# Patient Record
Sex: Male | Born: 1960
Health system: Southern US, Community
[De-identification: ages and names within clinical notes are randomized; demographics above are authoritative.]

## PROBLEM LIST (undated history)

## (undated) DIAGNOSIS — Z87442 Personal history of urinary calculi: Secondary | ICD-10-CM

## (undated) DIAGNOSIS — K819 Cholecystitis, unspecified: Secondary | ICD-10-CM

## (undated) DIAGNOSIS — M1990 Unspecified osteoarthritis, unspecified site: Secondary | ICD-10-CM

## (undated) DIAGNOSIS — I10 Essential (primary) hypertension: Secondary | ICD-10-CM

## (undated) HISTORY — DX: Essential (primary) hypertension: I10

## (undated) HISTORY — PX: SHOULDER SURGERY: SHX246

## (undated) HISTORY — PX: APPENDECTOMY: SHX54

---

## 2005-03-30 ENCOUNTER — Ambulatory Visit: Payer: Self-pay | Admitting: Cardiology

## 2005-03-31 ENCOUNTER — Ambulatory Visit: Payer: Self-pay | Admitting: Cardiology

## 2005-05-02 ENCOUNTER — Ambulatory Visit: Payer: Self-pay | Admitting: Cardiology

## 2014-07-23 ENCOUNTER — Emergency Department (HOSPITAL_COMMUNITY): Payer: BC Managed Care – PPO

## 2014-07-23 ENCOUNTER — Inpatient Hospital Stay (HOSPITAL_COMMUNITY)
Admission: EM | Admit: 2014-07-23 | Discharge: 2014-07-26 | DRG: 580 | Disposition: A | Discharge status: Home / Self Care | Payer: BC Managed Care – PPO | Attending: Internal Medicine | Admitting: Internal Medicine

## 2014-07-23 ENCOUNTER — Encounter (HOSPITAL_COMMUNITY): Payer: Self-pay | Admitting: Emergency Medicine

## 2014-07-23 DIAGNOSIS — E872 Acidosis: Secondary | ICD-10-CM | POA: Diagnosis present

## 2014-07-23 DIAGNOSIS — N5089 Other specified disorders of the male genital organs: Secondary | ICD-10-CM | POA: Diagnosis present

## 2014-07-23 DIAGNOSIS — L0390 Cellulitis, unspecified: Secondary | ICD-10-CM

## 2014-07-23 DIAGNOSIS — L03314 Cellulitis of groin: Principal | ICD-10-CM | POA: Diagnosis present

## 2014-07-23 DIAGNOSIS — Z7982 Long term (current) use of aspirin: Secondary | ICD-10-CM | POA: Diagnosis not present

## 2014-07-23 DIAGNOSIS — Z6838 Body mass index (BMI) 38.0-38.9, adult: Secondary | ICD-10-CM | POA: Diagnosis not present

## 2014-07-23 DIAGNOSIS — N492 Inflammatory disorders of scrotum: Secondary | ICD-10-CM | POA: Diagnosis present

## 2014-07-23 DIAGNOSIS — E8729 Other acidosis: Secondary | ICD-10-CM | POA: Diagnosis present

## 2014-07-23 DIAGNOSIS — N50819 Testicular pain, unspecified: Secondary | ICD-10-CM

## 2014-07-23 DIAGNOSIS — N508 Other specified disorders of male genital organs: Secondary | ICD-10-CM | POA: Diagnosis present

## 2014-07-23 DIAGNOSIS — A4901 Methicillin susceptible Staphylococcus aureus infection, unspecified site: Secondary | ICD-10-CM

## 2014-07-23 DIAGNOSIS — E669 Obesity, unspecified: Secondary | ICD-10-CM | POA: Diagnosis present

## 2014-07-23 DIAGNOSIS — I10 Essential (primary) hypertension: Secondary | ICD-10-CM

## 2014-07-23 DIAGNOSIS — R030 Elevated blood-pressure reading, without diagnosis of hypertension: Secondary | ICD-10-CM

## 2014-07-23 DIAGNOSIS — IMO0001 Reserved for inherently not codable concepts without codable children: Secondary | ICD-10-CM | POA: Diagnosis present

## 2014-07-23 LAB — BASIC METABOLIC PANEL
Anion gap: 15 (ref 5–15)
BUN: 19 mg/dL (ref 6–23)
CO2: 23 mEq/L (ref 19–32)
Calcium: 9.4 mg/dL (ref 8.4–10.5)
Chloride: 100 mEq/L (ref 96–112)
Creatinine, Ser: 1 mg/dL (ref 0.50–1.35)
GFR calc Af Amer: >90 mL/min (ref >90)
GFR calc non Af Amer: 84 mL/min — ABNORMAL LOW (ref >90)
Glucose, Bld: 103 mg/dL — ABNORMAL HIGH (ref 70–99)
Potassium: 3.9 mEq/L (ref 3.7–5.3)
Sodium: 138 mEq/L (ref 137–147)

## 2014-07-23 LAB — CBC WITH DIFFERENTIAL/PLATELET
Basophils Absolute: 0 10*3/uL (ref 0.0–0.1)
Basophils Relative: 0 % (ref 0–1)
Eosinophils Absolute: 0 10*3/uL (ref 0.0–0.7)
Eosinophils Relative: 0 % (ref 0–5)
HCT: 36.6 % — ABNORMAL LOW (ref 39.0–52.0)
Hemoglobin: 12.3 g/dL — ABNORMAL LOW (ref 13.0–17.0)
Lymphocytes Relative: 16 % (ref 12–46)
Lymphs Abs: 2.4 10*3/uL (ref 0.7–4.0)
MCH: 31.1 pg (ref 26.0–34.0)
MCHC: 33.6 g/dL (ref 30.0–36.0)
MCV: 92.7 fL (ref 78.0–100.0)
Monocytes Absolute: 0.6 10*3/uL (ref 0.1–1.0)
Monocytes Relative: 4 % (ref 3–12)
Neutro Abs: 11.8 10*3/uL — ABNORMAL HIGH (ref 1.7–7.7)
Neutrophils Relative %: 80 % — ABNORMAL HIGH (ref 43–77)
Platelets: 309 10*3/uL (ref 150–400)
RBC: 3.95 MIL/uL — ABNORMAL LOW (ref 4.22–5.81)
RDW: 12.5 % (ref 11.5–15.5)
WBC: 14.9 10*3/uL — ABNORMAL HIGH (ref 4.0–10.5)

## 2014-07-23 LAB — URINALYSIS, ROUTINE W REFLEX MICROSCOPIC
Bilirubin Urine: NEGATIVE
Glucose, UA: NEGATIVE mg/dL
Hgb urine dipstick: NEGATIVE
Ketones, ur: 15 mg/dL — AB
Leukocytes, UA: NEGATIVE
Nitrite: NEGATIVE
Protein, ur: NEGATIVE mg/dL
Specific Gravity, Urine: 1.03 (ref 1.005–1.030)
Urobilinogen, UA: 0.2 mg/dL (ref 0.0–1.0)
pH: 6 (ref 5.0–8.0)

## 2014-07-23 LAB — LACTIC ACID, PLASMA: Lactic Acid, Venous: 0.6 mmol/L (ref 0.5–2.2)

## 2014-07-23 MED ORDER — DOCUSATE SODIUM 100 MG PO CAPS
100.0000 mg | ORAL_CAPSULE | Freq: Two times a day (BID) | ORAL | Status: DC
  Administered 2014-07-23 – 2014-07-26 (×3): 100 mg via ORAL
  Filled 2014-07-23 (×8): qty 1

## 2014-07-23 MED ORDER — ONDANSETRON HCL 4 MG/2ML IJ SOLN: 4.0000 mg | Freq: Four times a day (QID) | INTRAMUSCULAR | Status: DC | PRN

## 2014-07-23 MED ORDER — PIPERACILLIN-TAZOBACTAM 3.375 G IVPB
3.3750 g | Freq: Three times a day (TID) | INTRAVENOUS | Status: DC
  Administered 2014-07-23 – 2014-07-24 (×3): 3.375 g via INTRAVENOUS
  Filled 2014-07-23 (×4): qty 50

## 2014-07-23 MED ORDER — SODIUM CHLORIDE 0.9 % IV BOLUS (SEPSIS)
1000.0000 mL | Freq: Once | INTRAVENOUS | Status: AC
  Administered 2014-07-23: 1000 mL via INTRAVENOUS

## 2014-07-23 MED ORDER — ONDANSETRON HCL 4 MG PO TABS: 4.0000 mg | ORAL_TABLET | Freq: Four times a day (QID) | ORAL | Status: DC | PRN

## 2014-07-23 MED ORDER — SODIUM CHLORIDE 0.9 % IV SOLN
Freq: Once | INTRAVENOUS | Status: AC
  Administered 2014-07-23: 17:00:00 via INTRAVENOUS

## 2014-07-23 MED ORDER — MORPHINE SULFATE 4 MG/ML IJ SOLN
4.0000 mg | Freq: Once | INTRAMUSCULAR | Status: AC
  Administered 2014-07-23: 4 mg via INTRAVENOUS
  Filled 2014-07-23: qty 1

## 2014-07-23 MED ORDER — HEPARIN SODIUM (PORCINE) 5000 UNIT/ML IJ SOLN
5000.0000 [IU] | Freq: Three times a day (TID) | INTRAMUSCULAR | Status: DC
  Administered 2014-07-23 – 2014-07-25 (×4): 5000 [IU] via SUBCUTANEOUS
  Filled 2014-07-23 (×10): qty 1

## 2014-07-23 MED ORDER — VANCOMYCIN HCL 10 G IV SOLR
2500.0000 mg | Freq: Once | INTRAVENOUS | Status: AC
  Administered 2014-07-23: 2500 mg via INTRAVENOUS
  Filled 2014-07-23: qty 2500

## 2014-07-23 MED ORDER — ONDANSETRON HCL 4 MG/2ML IJ SOLN
4.0000 mg | Freq: Once | INTRAMUSCULAR | Status: AC
  Administered 2014-07-23: 4 mg via INTRAVENOUS
  Filled 2014-07-23: qty 2

## 2014-07-23 MED ORDER — IOHEXOL 300 MG/ML  SOLN
100.0000 mL | Freq: Once | INTRAMUSCULAR | Status: AC | PRN
  Administered 2014-07-23: 100 mL via INTRAVENOUS

## 2014-07-23 MED ORDER — MORPHINE SULFATE 2 MG/ML IJ SOLN
1.0000 mg | INTRAMUSCULAR | Status: DC | PRN
  Administered 2014-07-24: 2 mg via INTRAVENOUS
  Filled 2014-07-23: qty 1

## 2014-07-23 MED ORDER — VANCOMYCIN HCL 10 G IV SOLR
1250.0000 mg | Freq: Two times a day (BID) | INTRAVENOUS | Status: DC
  Administered 2014-07-24 – 2014-07-25 (×3): 1250 mg via INTRAVENOUS
  Filled 2014-07-23 (×4): qty 1250

## 2014-07-23 NOTE — ED Provider Notes (Signed)
CSN: [id number]     Arrival date & time 07/23/14  1336 History   First MD Initiated Contact with Patient 07/23/14 1516     Chief Complaint  Patient presents with  . Testicle Pain     (Consider location/radiation/quality/duration/timing/severity/associated sxs/prior Treatment) HPI  Bobby Ortiz is a 53 y.o. male who was otherwise healthy complaining of painful swelling to right testicle. Patient states that he shaved the area week ago and several days ago noticed what he thought was an ingrown hair, since that time the area has opened and is draining purulent discharge and the redness and swelling are spreading rapidly. He denies fever, nausea, vomiting, change in bowel or bladder habits, dysuria, hematuria, concern about STDs, history of diabetes.   History reviewed. No pertinent past medical history. Past Surgical History  Procedure Laterality Date  . Appendectomy     History reviewed. No pertinent family history. History  Substance Use Topics  . Smoking status: Never Smoker   . Smokeless tobacco: Not on file  . Alcohol Use: No    Review of Systems  10 systems reviewed and found to be negative, except as noted in the HPI.   Allergies  Review of patient's allergies indicates no known allergies.  Home Medications   Prior to Admission medications   Medication Sig Start Date End Date Taking? Authorizing Provider  aspirin EC 81 MG tablet Take 81 mg by mouth daily.   Yes Historical Provider, MD  ibuprofen (ADVIL,MOTRIN) 200 MG tablet Take 400-600 mg by mouth every 6 (six) hours as needed.   Yes Historical Provider, MD   BP 147/88  Pulse 86  Temp(Src) 98.6 F (37 C) (Oral)  Resp 18  Ht 6\' 3"  (1.905 m)  Wt 305 lb (138.347 kg)  BMI 38.12 kg/m2  SpO2 96% Physical Exam  Nursing note and vitals reviewed. Constitutional: He is oriented to person, place, and time. He appears well-developed and well-nourished. No distress.  HENT:  Head: Normocephalic.  Mouth/Throat:  Oropharynx is clear and moist.  Eyes: Conjunctivae and EOM are normal.  Neck: Normal range of motion.  Cardiovascular: Normal rate and regular rhythm.   Pulmonary/Chest: Effort normal and breath sounds normal. No stridor.  Abdominal: Soft. Bowel sounds are normal. He exhibits no distension and no mass. There is no tenderness. There is no rebound.  Genitourinary: Right testis shows swelling and tenderness. Penile tenderness present.  Significant cellulitis in right inguinal and testicular area with edema extending into the penis. There is a actively draining abscess at the distal inguinal area  Musculoskeletal: Normal range of motion.  Lymphadenopathy:       Right: Inguinal adenopathy present.  Neurological: He is alert and oriented to person, place, and time.  Psychiatric: He has a normal mood and affect.    ED Course  Procedures (including critical care time) Labs Review Labs Reviewed  CBC WITH DIFFERENTIAL - Abnormal; Notable for the following:    WBC 14.9 (*)    RBC 3.95 (*)    Hemoglobin 12.3 (*)    HCT 36.6 (*)    Neutrophils Relative % 80 (*)    Neutro Abs 11.8 (*)    All other components within normal limits  BASIC METABOLIC PANEL - Abnormal; Notable for the following:    Glucose, Bld 103 (*)    GFR calc non Af Amer 84 (*)    All other components within normal limits  URINALYSIS, ROUTINE W REFLEX MICROSCOPIC - Abnormal; Notable for the following:    Ketones,  ur 15 (*)    All other components within normal limits  CULTURE, BLOOD (ROUTINE X 2)  CULTURE, BLOOD (ROUTINE X 2)  WOUND CULTURE    Imaging Review Ct Pelvis W Contrast  07/23/2014   CLINICAL DATA:  Right-sided testicular swelling.Testicular swelling, right N50.8 (ICD-10-CM). Initial encounter.  EXAM: CT PELVIS WITH CONTRAST  TECHNIQUE: Multidetector CT imaging of the pelvis was performed using the standard protocol following the bolus administration of intravenous contrast.  CONTRAST:  OMNIPAQUE IOHEXOL 300  MG/ML  SOLN  COMPARISON:  None.  FINDINGS: Normal imaged large and small bowel loops. Prior appendectomy per report.  Right external iliac node measures 1.7 cm on image 64 of series 3. Right inguinal node measures 1.8 cm on image 74.  Normal urinary bladder and prostate, without free pelvic fluid.  Bilateral fat containing inguinal hernias, small.  A fat containing ventral pelvic wall hernia. Nonspecific atrophy of the lower right rectus musculature.  Extensive skin thickening and subcutaneous edema is identified at the base of the scrotum, eccentric right. Example image 81 of series 3. There is diffuse scrotal edema and subcutaneous fluid. Testicles not well evaluated. No drainable fluid collection or evidence of soft tissue gas.  Degenerative partial fusion of the bilateral sacroiliac joints. Advanced degenerative disc disease involving the lumbosacral junction  IMPRESSION: 1. Findings most consistent with cellulitis involving the scrotum, especially eccentric right. No evidence of abscess or soft tissue gas. Consider ultrasound evaluation of the scrotal contents. 2. Right pelvic adenopathy, favored to be reactive. Given the extent of adenopathy, pelvic CT followup at 3 to six-months should be considered to confirm resolution. 3. Fat containing bilateral inguinal and ventral pelvic wall hernias.   Electronically Signed   By: Jeronimo Greaves M.D.   On: 07/23/2014 18:25     EKG Interpretation None      MDM   Final diagnoses:  Testicle pain  Cellulitis  Testicular swelling, right   Filed Vitals:   07/23/14 1645 07/23/14 1700 07/23/14 1715 07/23/14 1730  BP: 152/90 160/81 155/94 147/88  Pulse: 81 83 83 86  Temp:      TempSrc:      Resp:      Height:      Weight:      SpO2: 96% 96% 97% 96%    Medications  vancomycin (VANCOCIN) 2,500 mg in sodium chloride 0.9 % 500 mL IVPB (2,500 mg Intravenous New Bag/Given 07/23/14 1719)  vancomycin (VANCOCIN) 1,250 mg in sodium chloride 0.9 % 250 mL IVPB  (not administered)  piperacillin-tazobactam (ZOSYN) IVPB 3.375 g (0 g Intravenous Stopped 07/23/14 1719)  morphine 4 MG/ML injection 4 mg (4 mg Intravenous Given 07/23/14 1617)  ondansetron (ZOFRAN) injection 4 mg (4 mg Intravenous Given 07/23/14 1618)  sodium chloride 0.9 % bolus 1,000 mL (0 mLs Intravenous Stopped 07/23/14 1719)  0.9 %  sodium chloride infusion ( Intravenous New Bag/Given 07/23/14 1720)  iohexol (OMNIPAQUE) 300 MG/ML solution 100 mL (100 mLs Intravenous Contrast Given 07/23/14 1755)    Bobby Ortiz is a 53 y.o. male presenting with a significant cellulitis to the right inguinal area extending into the skin of the testes and penile shaft. No systemic signs of infection with normal vital signs, he is afebrile. White count is elevated at 14.9. Patient will be given pain medication, blood cultures, wound cultures are drawn before starting vancomycin and Zosyn. Will obtain CT to evaluate for drainable collection.  CT shows diffuse edema with no focal fluid collection. Patient will be admitted  for IV antibiotics.   Unassigned admission accepted  internal medicine teaching service.   Urology [facility] and [facility] evaluated the patient and will both continue to consult. Plan is to give the patient IV antibiotics and reevaluate in the morning. If there is not significant improvement they plan to take him to the OR, therefore patient needs to remain nothing by mouth after midnight. Discussed plan in internal medicine resident Dr. Derrill KayGoodman  This is a shared visit with the attending physician who personally evaluated the patient and agrees with the care plan.     Bobby Emeryicole Leonidus Rowand, PA-C 07/23/14 2025  Bobby EmeryNicole Richar Dunklee, PA-C 07/23/14 2110

## 2014-07-23 NOTE — Progress Notes (Signed)
I agree with the above assessment. Consider de-escalating Zosyn to Unasyn since patient does not seem to have any MDR risk factors.   Vinnie LevelBenjamin Ivyana Locey, PharmD.  Clinical Pharmacist Pager [phone]

## 2014-07-23 NOTE — Consult Note (Signed)
Urology Consult  Referring physician: Chandra Batch Reason for referral: Groin infection  Chief Complaint: Groin/scrotal infection  History of Present Illness: Swelling rt testes possibly from shaving one week prior; draining; no systemic symptoms; admitted to Medicine on Vanc and Zosyn: WBC 14.9: Ct scan - no obvious abscess; keeping with cellulitis; started just above the penis in groin; draining now for 1-2 days; no fever Modifying factors: There are no other modifying factors  Associated signs and symptoms: There are no other associated signs and symptoms Aggravating and relieving factors: There are no other aggravating or relieving factors Severity: Moderate Duration: Persistent  History reviewed. No pertinent past medical history. Past Surgical History  Procedure Laterality Date  . Appendectomy      Medications: I have reviewed the patient's current medications. Allergies: No Known Allergies  History reviewed. No pertinent family history. Social History:  reports that he has never smoked. He does not have any smokeless tobacco history on file. He reports that he does not drink alcohol. His drug history is not on file.  ROS: All systems are reviewed and negative except as noted. Rest negative  Physical Exam:  Vital signs in last 24 hours: Temp:  [98.6 F (37 C)] 98.6 F (37 C) (10/28 1343) Pulse Rate:  [80-93] 86 (10/28 1730) Resp:  [18] 18 (10/28 1343) BP: (143-184)/(81-116) 147/88 mmHg (10/28 1730) SpO2:  [96 %-97 %] 96 % (10/28 1730) Weight:  [138.347 kg (305 lb)] 138.347 kg (305 lb) (10/28 1600)  Cardiovascular: Skin warm; not flushed Respiratory: Breaths quiet; no shortness of breath Abdomen: No masses Neurological: Normal sensation to touch Musculoskeletal: Normal motor function arms and legs Lymphatics: No inguinal adenopathy Skin: No rashes Genitourinary:cellulitis rt lower abdomen/mild groin and rt hemiscrotum; draining area above penis; scrotum doughy easily to  palpate testes  Laboratory Data:  Results for orders placed during the hospital encounter of 07/23/14 (from the past 72 hour(s))  URINALYSIS, ROUTINE W REFLEX MICROSCOPIC     Status: Abnormal   Collection Time    07/23/14  3:20 PM      Result Value Ref Range   Color, Urine YELLOW  YELLOW   APPearance CLEAR  CLEAR   Specific Gravity, Urine 1.030  1.005 - 1.030   pH 6.0  5.0 - 8.0   Glucose, UA NEGATIVE  NEGATIVE mg/dL   Hgb urine dipstick NEGATIVE  NEGATIVE   Bilirubin Urine NEGATIVE  NEGATIVE   Ketones, ur 15 (*) NEGATIVE mg/dL   Protein, ur NEGATIVE  NEGATIVE mg/dL   Urobilinogen, UA 0.2  0.0 - 1.0 mg/dL   Nitrite NEGATIVE  NEGATIVE   Leukocytes, UA NEGATIVE  NEGATIVE   Comment: MICROSCOPIC NOT DONE ON URINES WITH NEGATIVE PROTEIN, BLOOD, LEUKOCYTES, NITRITE, OR GLUCOSE <1000 mg/dL.  CBC WITH DIFFERENTIAL     Status: Abnormal   Collection Time    07/23/14  3:35 PM      Result Value Ref Range   WBC 14.9 (*) 4.0 - 10.5 K/uL   RBC 3.95 (*) 4.22 - 5.81 MIL/uL   Hemoglobin 12.3 (*) 13.0 - 17.0 g/dL   HCT 36.6 (*) 39.0 - 52.0 %   MCV 92.7  78.0 - 100.0 fL   MCH 31.1  26.0 - 34.0 pg   MCHC 33.6  30.0 - 36.0 g/dL   RDW 12.5  11.5 - 15.5 %   Platelets 309  150 - 400 K/uL   Neutrophils Relative % 80 (*) 43 - 77 %   Neutro Abs 11.8 (*) 1.7 -  7.7 K/uL   Lymphocytes Relative 16  12 - 46 %   Lymphs Abs 2.4  0.7 - 4.0 K/uL   Monocytes Relative 4  3 - 12 %   Monocytes Absolute 0.6  0.1 - 1.0 K/uL   Eosinophils Relative 0  0 - 5 %   Eosinophils Absolute 0.0  0.0 - 0.7 K/uL   Basophils Relative 0  0 - 1 %   Basophils Absolute 0.0  0.0 - 0.1 K/uL  BASIC METABOLIC PANEL     Status: Abnormal   Collection Time    07/23/14  3:35 PM      Result Value Ref Range   Sodium 138  137 - 147 mEq/L   Potassium 3.9  3.7 - 5.3 mEq/L   Chloride 100  96 - 112 mEq/L   CO2 23  19 - 32 mEq/L   Glucose, Bld 103 (*) 70 - 99 mg/dL   BUN 19  6 - 23 mg/dL   Creatinine, Ser 1.00  0.50 - 1.35 mg/dL    Calcium 9.4  8.4 - 10.5 mg/dL   GFR calc non Af Amer 84 (*) >90 mL/min   GFR calc Af Amer >90  >90 mL/min   Comment: (NOTE)     The eGFR has been calculated using the CKD EPI equation.     This calculation has not been validated in all clinical situations.     eGFR's persistently <90 mL/min signify possible Chronic Kidney     Disease.   Anion gap 15  5 - 15   No results found for this or any previous visit (from the past 240 hour(s)). Creatinine:  Recent Labs  07/23/14 1535  CREATININE 1.00    Xrays: See report/chart Reviewed  Impression/Assessment:  Rt inguinal abscess starting just to the rt of the penile shaft; now spreading over inguinal ring and into scrotum  Plan:  Will review with Dr Brantley Stage; favoring observation overnight since it is draining  Bobby Ortiz A 07/23/2014, 7:57 PM

## 2014-07-23 NOTE — Consult Note (Signed)
Reason for Consult:cellulitis right groin scrotum Referring Physician: Mc[facility]ad  MD  Bobby Ortiz is an 53 y.o. male.  HPI: asked to see at the request of Dr Leilani Merl of urology and Dr Daryll Drown of medicine for right scrotal erythema and draining tract at top of scrotum.  Pt was shaving the area a week ago and developed a small pimple that has groin over the last week.  No fever or chills.  The area became red over the weekend and started to drain.  It is draining now.  Seen  By urology and I was asked to see as well.  Not diabetic or on any medication.   History reviewed. No pertinent past medical history.  Past Surgical History  Procedure Laterality Date  . Appendectomy      History reviewed. No pertinent family history.  Social History:  reports that he has never smoked. He does not have any smokeless tobacco history on file. He reports that he does not drink alcohol. His drug history is not on file.  Allergies: No Known Allergies  Medications: I have reviewed the patient's current medications.  Results for orders placed during the hospital encounter of 07/23/14 (from the past 48 hour(s))  URINALYSIS, ROUTINE W REFLEX MICROSCOPIC     Status: Abnormal   Collection Time    07/23/14  3:20 PM      Result Value Ref Range   Color, Urine YELLOW  YELLOW   APPearance CLEAR  CLEAR   Specific Gravity, Urine 1.030  1.005 - 1.030   pH 6.0  5.0 - 8.0   Glucose, UA NEGATIVE  NEGATIVE mg/dL   Hgb urine dipstick NEGATIVE  NEGATIVE   Bilirubin Urine NEGATIVE  NEGATIVE   Ketones, ur 15 (*) NEGATIVE mg/dL   Protein, ur NEGATIVE  NEGATIVE mg/dL   Urobilinogen, UA 0.2  0.0 - 1.0 mg/dL   Nitrite NEGATIVE  NEGATIVE   Leukocytes, UA NEGATIVE  NEGATIVE   Comment: MICROSCOPIC NOT DONE ON URINES WITH NEGATIVE PROTEIN, BLOOD, LEUKOCYTES, NITRITE, OR GLUCOSE <1000 mg/dL.  CBC WITH DIFFERENTIAL     Status: Abnormal   Collection Time    07/23/14  3:35 PM      Result Value Ref Range   WBC 14.9 (*) 4.0  - 10.5 K/uL   RBC 3.95 (*) 4.22 - 5.81 MIL/uL   Hemoglobin 12.3 (*) 13.0 - 17.0 g/dL   HCT 36.6 (*) 39.0 - 52.0 %   MCV 92.7  78.0 - 100.0 fL   MCH 31.1  26.0 - 34.0 pg   MCHC 33.6  30.0 - 36.0 g/dL   RDW 12.5  11.5 - 15.5 %   Platelets 309  150 - 400 K/uL   Neutrophils Relative % 80 (*) 43 - 77 %   Neutro Abs 11.8 (*) 1.7 - 7.7 K/uL   Lymphocytes Relative 16  12 - 46 %   Lymphs Abs 2.4  0.7 - 4.0 K/uL   Monocytes Relative 4  3 - 12 %   Monocytes Absolute 0.6  0.1 - 1.0 K/uL   Eosinophils Relative 0  0 - 5 %   Eosinophils Absolute 0.0  0.0 - 0.7 K/uL   Basophils Relative 0  0 - 1 %   Basophils Absolute 0.0  0.0 - 0.1 K/uL  BASIC METABOLIC PANEL     Status: Abnormal   Collection Time    07/23/14  3:35 PM      Result Value Ref Range   Sodium 138  137 -  147 mEq/L   Potassium 3.9  3.7 - 5.3 mEq/L   Chloride 100  96 - 112 mEq/L   CO2 23  19 - 32 mEq/L   Glucose, Bld 103 (*) 70 - 99 mg/dL   BUN 19  6 - 23 mg/dL   Creatinine, Ser 1.00  0.50 - 1.35 mg/dL   Calcium 9.4  8.4 - 10.5 mg/dL   GFR calc non Af Amer 84 (*) >90 mL/min   GFR calc Af Amer >90  >90 mL/min   Comment: (NOTE)     The eGFR has been calculated using the CKD EPI equation.     This calculation has not been validated in all clinical situations.     eGFR's persistently <90 mL/min signify possible Chronic Kidney     Disease.   Anion gap 15  5 - 15    Ct Pelvis W Contrast  07/23/2014   CLINICAL DATA:  Right-sided testicular swelling.Testicular swelling, right N50.8 (ICD-10-CM). Initial encounter.  EXAM: CT PELVIS WITH CONTRAST  TECHNIQUE: Multidetector CT imaging of the pelvis was performed using the standard protocol following the bolus administration of intravenous contrast.  CONTRAST:  145m OMNIPAQUE IOHEXOL 300 MG/ML  SOLN  COMPARISON:  None.  FINDINGS: Normal imaged large and small bowel loops. Prior appendectomy per report.  Right external iliac node measures 1.7 cm on image 64 of series 3. Right inguinal node  measures 1.8 cm on image 74.  Normal urinary bladder and prostate, without free pelvic fluid.  Bilateral fat containing inguinal hernias, small.  A fat containing ventral pelvic wall hernia. Nonspecific atrophy of the lower right rectus musculature.  Extensive skin thickening and subcutaneous edema is identified at the base of the scrotum, eccentric right. Example image 81 of series 3. There is diffuse scrotal edema and subcutaneous fluid. Testicles not well evaluated. No drainable fluid collection or evidence of soft tissue gas.  Degenerative partial fusion of the bilateral sacroiliac joints. Advanced degenerative disc disease involving the lumbosacral junction  IMPRESSION: 1. Findings most consistent with cellulitis involving the scrotum, especially eccentric right. No evidence of abscess or soft tissue gas. Consider ultrasound evaluation of the scrotal contents. 2. Right pelvic adenopathy, favored to be reactive. Given the extent of adenopathy, pelvic CT followup at 3 to six-months should be considered to confirm resolution. 3. Fat containing bilateral inguinal and ventral pelvic wall hernias.   Electronically Signed   By: KAbigail MiyamotoM.D.   On: 07/23/2014 18:25    Review of Systems  Constitutional: Negative for fever, chills and malaise/fatigue.  HENT: Negative.   Eyes: Negative.   Respiratory: Negative.   Cardiovascular: Negative.   Gastrointestinal: Negative.   Genitourinary: Negative.   Musculoskeletal: Negative for myalgias.  Skin: Negative.   Neurological: Negative.   Endo/Heme/Allergies: Negative.   Psychiatric/Behavioral: Negative.    Blood pressure 152/87, pulse 88, temperature 98.6 F (37 C), temperature source Oral, resp. rate 18, height '6\' 3"'  (1.905 m), weight 305 lb (138.347 kg), SpO2 100.00%. Physical Exam  Constitutional: He is oriented to person, place, and time. He appears well-developed and well-nourished.  HENT:  Head: Normocephalic and atraumatic.  Eyes: Pupils are  equal, round, and reactive to light. No scleral icterus.  Neck: Normal range of motion.  Cardiovascular: Normal rate and regular rhythm.   No murmur heard. Respiratory: Effort normal and breath sounds normal.  GI: Soft. He exhibits no distension.  Genitourinary: Penis normal.    Right testis shows swelling and tenderness. Right testis shows no mass.  Left testis shows no mass, no swelling and no tenderness.  Neurological: He is alert and oriented to person, place, and time.  Skin: There is erythema.    Assessment/Plan: CELLULITIS RIGHT Anastasia Fiedler See with Urology.  Agree that since this is draining to try ABX for tonight since he is not toxic and this is not a Fournier's situation. Will follow with medicine. Dr Estill Dooms covering for urology   Arya Luttrull A. 07/23/2014, 9:04 PM

## 2014-07-23 NOTE — ED Notes (Signed)
Pt having swelling and pain to right testicle. [facility]

## 2014-07-23 NOTE — Progress Notes (Signed)
ANTIBIOTIC CONSULT NOTE - INITIAL  Pharmacy Consult for Vancomycin/Zosyn Indication: Cellulits  No Known Allergies  Vital Signs: Temp: 98.6 F (37 C) (10/28 1343) Temp Source: Oral (10/28 1343) BP: 184/109 mmHg (10/28 1343) Pulse Rate: 93 (10/28 1343)  Labs:  Recent Labs  07/23/14 1535  WBC 14.9*  HGB 12.3*  PLT 309  CREATININE 1.00   CrCl is unknown because there is no height on file for the current visit. No results found for this basename: VANCOTROUGH, VANCOPEAK, VANCORANDOM, GENTTROUGH, GENTPEAK, GENTRANDOM, TOBRATROUGH, TOBRAPEAK, TOBRARND, AMIKACINPEAK, AMIKACINTROU, AMIKACIN,  in the last 72 hours   Microbiology: No results found for this or any previous visit (from the past 720 hour(s)).  Medical History: History reviewed. No pertinent past medical history.  Assessment: 53 y/o M w/ swelling and pain to right testicle w/ possible pain on urination.  Afebrile, pulse 93, RR 18, BP 184/109, WBC 14.9.  Empiric antibiotics for potential cellulitis  Blood cx--> Wound cx-->  Goal of Therapy:  Vancomycin trough 10-15 mcg/mL  Plan:  Zosyn IV 3.375g q8h Vancomycin IV 2500 mg x 1, then Vancomycin IV 1250 mg q12h F/u vancomycin trough, renal function, cultures and susceptibilities.  Marisue BrooklynGazda, Anilah Huck 07/23/2014,4:16 PM

## 2014-07-23 NOTE — H&P (Signed)
Date: 07/23/2014               Patient Name:  Bobby PickettMarshall Ortiz MRN: [id number]  DOB: 1961-02-20 Age / Sex: 53 y.o., male   PCP: No Pcp Per Patient         Medical Service: Internal Medicine Teaching Service         Attending Physician: Dr. Inez CatalinaEmily B Mullen, MD    First Contact: Dr. Danella Pentonruong Pager: [phone]  Second Contact: Dr. Mikey BussingHoffman Pager: [phone]       After Hours (After 5p/  First Contact Pager: [phone]  weekends / holidays): Second Contact Pager: [phone]   Chief Complaint: R groin pain and swelling  History of Present Illness: Bobby Ortiz is a 53 year old man previously healthy presenting with right groin pain and swelling. He reports he shaved his pubic region on Friday. On Sunday, he noticed a painful nodule on his right groin adjacent to his scrotum. It became erythematous and edematous on Monday spreading to his scrotum. It had a pustule that he picked at yesterday (Tuesday). It drained pus - about 5ml total. The erythema and swelling has remained stable since yesterday. No previous similar episodes.   He has had a cough from a recent URI. Denies fevers, chills, vision changes, shortness of breath, chest pain, nausea, vomiting, abdominal pain, diarrhea, dysuria, hematuria, penile discharge, rash, bleeding/bruising, edema, arthralgias, myalgias, paresthesias, weakness, polyuria, polydipsia.  He reports he is sexually active with women. No history of sexually transmitted diseases. He uses condoms most of the time.   Meds: Current Facility-Administered Medications  Medication Dose Route Frequency Provider Last Rate Last Dose  . piperacillin-tazobactam (ZOSYN) IVPB 3.375 g  3.375 g Intravenous Q8H Candis SchatzBenjamin G Mancheril, RPH   3.375 g at 07/23/14 1700  . [START ON 07/24/2014] vancomycin (VANCOCIN) 1,250 mg in sodium chloride 0.9 % 250 mL IVPB  1,250 mg Intravenous Q12H Marisue BrooklynNicholas Gazda       Current Outpatient Prescriptions  Medication Sig Dispense Refill  . aspirin EC 81 MG tablet  Take 81 mg by mouth daily.      Marland Kitchen. ibuprofen (ADVIL,MOTRIN) 200 MG tablet Take 400-600 mg by mouth every 6 (six) hours as needed.        Allergies: Allergies as of 07/23/2014  . (No Known Allergies)   Past Medical History -Hypertension: diet controlled  Past Surgical History  Procedure Laterality Date  . Appendectomy     History reviewed. No pertinent family history.  History   Social History  . Marital Status: Married    Spouse Name: N/A    Number of Children: N/A  . Years of Education: N/A   Occupational History  . Not on file.   Social History Main Topics  . Smoking status: Never Smoker   . Smokeless tobacco: Not on file  . Alcohol Use: No  . Drug Use: Not on file  . Sexual Activity: Not on file   Other Topics Concern  . Not on file   Social History Narrative  . No narrative on file    Review of Systems: Constitutional: no fevers/chills Eyes: no vision changes Ears, nose, mouth, throat, and face: +cough Respiratory: no shortness of breath Cardiovascular: no chest pain Gastrointestinal: no nausea/vomiting, no abdominal pain, no constipation, no diarrhea Genitourinary: no dysuria, no hematuria Integument: no rash Hematologic/lymphatic: no bleeding/bruising, no edema Musculoskeletal: no arthralgias, no myalgias Neurological: no paresthesias, no weakness   Physical Exam: Blood pressure 147/88, pulse 86, temperature 98.6 F (37 C), temperature source Oral,  resp. rate 18, height 6\' 3"  (1.905 m), weight 305 lb (138.347 kg), SpO2 96.00%. General Apperance: NAD Head: Normocephalic, atraumatic Eyes: PERRL, EOMI, anicteric sclera Ears: Normal external ear canal Nose: Nares normal, septum midline, mucosa normal Throat: Lips, mucosa and tongue normal  Neck: Supple, trachea midline Back: No tenderness or bony abnormality  Lungs: Clear to auscultation bilaterally. No wheezes, rhonchi or rales. Breathing comfortably on room air Chest Wall: Nontender, no  deformity Heart: Regular rate and rhythm, no murmur/rub/gallop Abdomen: Soft, nontender, nondistended, no rebound/guarding Extremities: Normal, atraumatic, warm and well perfused, no edema Pulses: 2+ throughout Skin: 0.5 cm open wound with yellow pus actively draining in R inguinal area just to the right of the penile shaft with surrounding cellulitis and induration. Tender to palpation. Neurologic: Alert and oriented x 3. Normal strength and sensation   Lab results: Basic Metabolic Panel:  Recent Labs  78/29/5610/28/15 1535  NA 138  K 3.9  CL 100  CO2 23  GLUCOSE 103*  BUN 19  CREATININE 1.00  CALCIUM 9.4   CBC:  Recent Labs  07/23/14 1535  WBC 14.9*  NEUTROABS 11.8*  HGB 12.3*  HCT 36.6*  MCV 92.7  PLT 309    Urinalysis:  Recent Labs  07/23/14 1520  COLORURINE YELLOW  LABSPEC 1.030  PHURINE 6.0  GLUCOSEU NEGATIVE  HGBUR NEGATIVE  BILIRUBINUR NEGATIVE  KETONESUR 15*  PROTEINUR NEGATIVE  UROBILINOGEN 0.2  NITRITE NEGATIVE  LEUKOCYTESUR NEGATIVE    Imaging results:  Ct Pelvis W Contrast  07/23/2014   CLINICAL DATA:  Right-sided testicular swelling.Testicular swelling, right N50.8 (ICD-10-CM). Initial encounter.  EXAM: CT PELVIS WITH CONTRAST  TECHNIQUE: Multidetector CT imaging of the pelvis was performed using the standard protocol following the bolus administration of intravenous contrast.  CONTRAST:  100mL OMNIPAQUE IOHEXOL 300 MG/ML  SOLN  COMPARISON:  None.  FINDINGS: Normal imaged large and small bowel loops. Prior appendectomy per report.  Right external iliac node measures 1.7 cm on image 64 of series 3. Right inguinal node measures 1.8 cm on image 74.  Normal urinary bladder and prostate, without free pelvic fluid.  Bilateral fat containing inguinal hernias, small.  A fat containing ventral pelvic wall hernia. Nonspecific atrophy of the lower right rectus musculature.  Extensive skin thickening and subcutaneous edema is identified at the base of the scrotum,  eccentric right. Example image 81 of series 3. There is diffuse scrotal edema and subcutaneous fluid. Testicles not well evaluated. No drainable fluid collection or evidence of soft tissue gas.  Degenerative partial fusion of the bilateral sacroiliac joints. Advanced degenerative disc disease involving the lumbosacral junction  IMPRESSION: 1. Findings most consistent with cellulitis involving the scrotum, especially eccentric right. No evidence of abscess or soft tissue gas. Consider ultrasound evaluation of the scrotal contents. 2. Right pelvic adenopathy, favored to be reactive. Given the extent of adenopathy, pelvic CT followup at 3 to six-months should be considered to confirm resolution. 3. Fat containing bilateral inguinal and ventral pelvic wall hernias.   Electronically Signed   By: Jeronimo GreavesKyle  Talbot M.D.   On: 07/23/2014 18:25    Assessment & Plan by Problem: Principal Problem:   Cellulitis of groin, right Active Problems:   Elevated blood pressure   Testicular swelling, right  R groin & scrotal cellulitis with abscess: Bobby Ortiz shaved inguinal region on 10/23 and developed what he thought was an ingrown hair in his right inguinal region at the superolateral right scrotum. On 10/26, per Bobby Ortiz, the entire right groin and testicle  became erythematous and edematous and began draining pus from the ingrown hair site. The pain increased today prompting him to come to the ED, where more pus was able to be expressed by the EDP. CT pelvis w/o abscess just cellulitis, but recommended a scrotal u/s. No crepitus on exam, but significant induration and tenderness, so Fournier's is less likely. Leukocytosis to 14.9. Afebrile, no tachycardia or tachypnea - does not meet SIRS criteria. Urology and General Surgery were consulted and agreed with IV antibiotics and observation overnight. They are considering taking the Bobby Ortiz to the OR tomorrow. They did not recommend further imaging. No h/o DM2 and blood glucose normal on BMP, but  with significant infection and ketones in his urine, checking A1c.  - Admit to MedSurg for IV abx: Zosyn 3.375g Q8hr and Vanc 1250mg  BID - Morphine IV 1-2mg  q4h PRN pain - Regular diet, NPO @ [place]  - Checking A1c  - BCx x2 drawn in ED prior to abx administration   Anion gap: AG 15. Bicarb 23. Ketones present in his urine. Bobby Ortiz recvied 2L normal saline in the ED.  - Checking lactic acid  - BMP in AM   HTN: Per Bobby Ortiz, h/o HTN, and was previously treated with medication, unsure of the name. He states that he has been off the medication for a while b/c it made him "feel funny" and is controlling his BP with diet. On admission, SBP as high as 170s, likely pain induced, and improved with IV Morphine.  - Will monitor for now and may need to start a daily antihypertensive this admission.   FEN:  -regular diet -NPO after midnight  DVT PPx: [place] Heparin, holding AM for OR  Dispo: Disposition is deferred at this time, awaiting improvement of current medical problems. Anticipated discharge in approximately 1-2 day(s).   The patient does not have a current PCP (No Pcp Per Patient) and does not need an [facility] hospital follow-up appointment after discharge.  The patient does not have transportation limitations that hinder transportation to clinic appointments.  Signed: Griffin Basil, MD 07/23/2014, 7:41 PM

## 2014-07-23 NOTE — ED Notes (Signed)
Pt reports pain starting Sunday night. Pt states he shaved and thought maybe it was an ingrown hair, however it has continued to swell. Pt reports pain with movement and possible pain with urination. Pt worried about infection. Pt being changed into gown at this time. Will get urine.

## 2014-07-24 DIAGNOSIS — L03314 Cellulitis of groin: Principal | ICD-10-CM

## 2014-07-24 DIAGNOSIS — I10 Essential (primary) hypertension: Secondary | ICD-10-CM

## 2014-07-24 DIAGNOSIS — N508 Other specified disorders of male genital organs: Secondary | ICD-10-CM

## 2014-07-24 LAB — CBC
HCT: 33.4 % — ABNORMAL LOW (ref 39.0–52.0)
Hemoglobin: 11.1 g/dL — ABNORMAL LOW (ref 13.0–17.0)
MCH: 31.1 pg (ref 26.0–34.0)
MCHC: 33.2 g/dL (ref 30.0–36.0)
MCV: 93.6 fL (ref 78.0–100.0)
Platelets: 271 10*3/uL (ref 150–400)
RBC: 3.57 MIL/uL — ABNORMAL LOW (ref 4.22–5.81)
RDW: 12.5 % (ref 11.5–15.5)
WBC: 10.4 10*3/uL (ref 4.0–10.5)

## 2014-07-24 LAB — BASIC METABOLIC PANEL
Anion gap: 13 (ref 5–15)
BUN: 14 mg/dL (ref 6–23)
CO2: 23 mEq/L (ref 19–32)
Calcium: 8.7 mg/dL (ref 8.4–10.5)
Chloride: 99 mEq/L (ref 96–112)
Creatinine, Ser: 1.05 mg/dL (ref 0.50–1.35)
GFR calc Af Amer: >90 mL/min (ref >90)
GFR calc non Af Amer: 79 mL/min — ABNORMAL LOW (ref >90)
Glucose, Bld: 98 mg/dL (ref 70–99)
Potassium: 4.2 mEq/L (ref 3.7–5.3)
Sodium: 135 mEq/L — ABNORMAL LOW (ref 137–147)

## 2014-07-24 LAB — SURGICAL PCR SCREEN
MRSA, PCR: NEGATIVE
Staphylococcus aureus: POSITIVE — AB

## 2014-07-24 LAB — HEMOGLOBIN A1C
Hgb A1c MFr Bld: 5.9 % — ABNORMAL HIGH (ref <5.7)
Mean Plasma Glucose: 123 mg/dL — ABNORMAL HIGH (ref <117)

## 2014-07-24 MED ORDER — SODIUM CHLORIDE 0.9 % IV SOLN
3.0000 g | Freq: Three times a day (TID) | INTRAVENOUS | Status: DC
  Administered 2014-07-24 – 2014-07-25 (×3): 3 g via INTRAVENOUS
  Filled 2014-07-24 (×5): qty 3

## 2014-07-24 NOTE — H&P (Signed)
  Date: 07/24/2014  Patient name: Bobby Ortiz Colver  Medical record number: [id number]  Date of birth: 17-Jul-1961   I have seen and evaluated Bobby Ortiz Sales and discussed their care with the Residency Team.  Mr. Doylene Canardalley is a 53yo man with minimal PMH who presents with right groin pain and swelling thought to be related to cellulitis.  On exam, he has erythema, swelling and draining nodule to the right of the scrotum.  There is no induration.  He has a cough from a recent UTI, but ROS is otherwise negative. On labs, he had a WBC of 14.9.  Ct pelvis revealed cellulitis of the scrotum without abscess and reactive LAD.   Assessment and Plan: I have seen and evaluated the patient as outlined above. I agree with the formulated Assessment and Plan as detailed in the residents' admission note, with the following changes:   1. R. Groin/scrotal cellulitis - Vanc/Zosyn - Morphine for pain  - urology and gen surg consults from the ED for possible debridement - BC X 2 pending  2. HTN - Patient not treated at home, if remains elevated, will add anti-hypertensive medication  Other issues per resident note.   Inez CatalinaEmily B Mullen, MD 10/29/20153:37 PM

## 2014-07-24 NOTE — Progress Notes (Signed)
Utilization review completed.  

## 2014-07-24 NOTE — Progress Notes (Signed)
[facility] Progress Note     Subjective: Pt thinks its a bit better.  Says there has been some drainage overnight.  WBC improving.  Says its still quite tender and swollen.  Redness is still there.  Objective: Vital signs in last 24 hours: Temp:  [98.6 F (37 C)-99.5 F (37.5 C)] 98.9 F (37.2 C) (10/29 0523) Pulse Rate:  [80-93] 81 (10/29 0523) Resp:  [16-18] 16 (10/29 0523) BP: (138-184)/(70-116) 151/91 mmHg (10/29 0523) SpO2:  [96 %-100 %] 97 % (10/29 0523) Weight:  [305 lb (138.347 kg)] 305 lb (138.347 kg) (10/28 1600) Last BM Date: 07/23/14  Intake/Output from previous day: 10/28 0701 - 10/29 0700 In: -  Out: 1250 [Urine:1250] Intake/Output this shift: Total I/O In: -  Out: 400 [Urine:400]  PE: Gen:  Alert, NAD, pleasant Right groin:  At the base of the scrotum there is a open wound draining purulent drainage.  There is extensive cellulitis, induration and fluctuance.  Lab Results:   Recent Labs  07/23/14 1535 07/24/14 0535  WBC 14.9* 10.4  HGB 12.3* 11.1*  HCT 36.6* 33.4*  PLT 309 271   BMET  Recent Labs  07/23/14 1535 07/24/14 0535  NA 138 135*  K 3.9 4.2  CL 100 99  CO2 23 23  GLUCOSE 103* 98  BUN 19 14  CREATININE 1.00 1.05  CALCIUM 9.4 8.7   PT/INR No results found for this basename: LABPROT, INR,  in the last 72 hours CMP     Component Value Date/Time   NA 135* 07/24/2014 0535   K 4.2 07/24/2014 0535   CL 99 07/24/2014 0535   CO2 23 07/24/2014 0535   GLUCOSE 98 07/24/2014 0535   BUN 14 07/24/2014 0535   CREATININE 1.05 07/24/2014 0535   CALCIUM 8.7 07/24/2014 0535   GFRNONAA 79* 07/24/2014 0535   GFRAA >90 07/24/2014 0535   Lipase  No results found for this basename: lipase       Studies/Results: Ct Pelvis W Contrast  07/23/2014   CLINICAL DATA:  Right-sided testicular swelling.Testicular swelling, right N50.8 (ICD-10-CM). Initial encounter.  EXAM: CT PELVIS WITH CONTRAST  TECHNIQUE: Multidetector CT imaging  of the pelvis was performed using the standard protocol following the bolus administration of intravenous contrast.  CONTRAST:  100mL OMNIPAQUE IOHEXOL 300 MG/ML  SOLN  COMPARISON:  None.  FINDINGS: Normal imaged large and small bowel loops. Prior appendectomy per report.  Right external iliac node measures 1.7 cm on image 64 of series 3. Right inguinal node measures 1.8 cm on image 74.  Normal urinary bladder and prostate, without free pelvic fluid.  Bilateral fat containing inguinal hernias, small.  A fat containing ventral pelvic wall hernia. Nonspecific atrophy of the lower right rectus musculature.  Extensive skin thickening and subcutaneous edema is identified at the base of the scrotum, eccentric right. Example image 81 of series 3. There is diffuse scrotal edema and subcutaneous fluid. Testicles not well evaluated. No drainable fluid collection or evidence of soft tissue gas.  Degenerative partial fusion of the bilateral sacroiliac joints. Advanced degenerative disc disease involving the lumbosacral junction  IMPRESSION: 1. Findings most consistent with cellulitis involving the scrotum, especially eccentric right. No evidence of abscess or soft tissue gas. Consider ultrasound evaluation of the scrotal contents. 2. Right pelvic adenopathy, favored to be reactive. Given the extent of adenopathy, pelvic CT followup at 3 to six-months should be considered to confirm resolution. 3. Fat containing bilateral inguinal and ventral pelvic wall hernias.  Electronically Signed   By: Jeronimo GreavesKyle  Ortiz M.D.   On: 07/23/2014 18:25    Anti-infectives: Anti-infectives   Start     Dose/Rate Route Frequency Ordered Stop   07/24/14 0500  vancomycin (VANCOCIN) 1,250 mg in sodium chloride 0.9 % 250 mL IVPB     1,250 mg 166.7 mL/hr over 90 Minutes Intravenous Every 12 hours 07/23/14 1638     07/23/14 1700  piperacillin-tazobactam (ZOSYN) IVPB 3.375 g     3.375 g 12.5 mL/hr over 240 Minutes Intravenous Every 8 hours 07/23/14  1651     07/23/14 1645  vancomycin (VANCOCIN) 2,500 mg in sodium chloride 0.9 % 500 mL IVPB     2,500 mg 250 mL/hr over 120 Minutes Intravenous  Once 07/23/14 1638 07/23/14 2005       Assessment/Plan Cellulitis/abscess right groin/scrotum Leukocytosis - improved to 10.4  Plan: 1.  Would defer to urology for surgical intervention seeing as the majority of this is involvement of the scrotum.  Dr. Lennox Ortiz is on today, I think he needs to assess this ASAP and decide for surgical intervention.  Based on my exam he is still quit indurated, fluctuant with extensive cellulitis and scrotal swelling.  IV antibiotics. 2.  Needs dressing changes to help collect the pus instead of letting it spread all over his gown/sheets 3.  Will sign off     LOS: 1 day    Ortiz, Bobby Gehling 07/24/2014, 9:50 AM Pager: [phone]

## 2014-07-24 NOTE — Progress Notes (Signed)
ANTIBIOTIC CONSULT NOTE   Pharmacy Consult for Unasyn Indication: Cellulits  No Known Allergies  Vital Signs: Temp: 98.9 F (37.2 C) (10/29 0523) BP: 151/91 mmHg (10/29 0523) Pulse Rate: 81 (10/29 0523)  Labs:  Recent Labs  07/23/14 1535 07/24/14 0535  WBC 14.9* 10.4  HGB 12.3* 11.1*  PLT 309 271  CREATININE 1.00 1.05   Estimated Creatinine Clearance: 122 ml/min (by C-G formula based on Cr of 1.05). No results found for this basename: VANCOTROUGH, VANCOPEAK, VANCORANDOM, GENTTROUGH, GENTPEAK, GENTRANDOM, TOBRATROUGH, TOBRAPEAK, TOBRARND, AMIKACINPEAK, AMIKACINTROU, AMIKACIN,  in the last 72 hours   Microbiology: Recent Results (from the past 720 hour(s))  CULTURE, BLOOD (ROUTINE X 2)     Status: None   Collection Time    07/23/14  4:25 PM      Result Value Ref Range Status   Specimen Description BLOOD LEFT ARM   Final   Special Requests BOTTLES DRAWN AEROBIC ONLY 10CC   Final   Culture  Setup Time     Final   Value: 07/23/2014 22:41     Performed at [facility]   Culture     Final   Value:        BLOOD CULTURE RECEIVED NO GROWTH TO DATE CULTURE WILL BE HELD FOR 5 DAYS BEFORE ISSUING A FINAL NEGATIVE REPORT     Performed at [facility]   Report Status PENDING   Incomplete  CULTURE, BLOOD (ROUTINE X 2)     Status: None   Collection Time    07/23/14  4:40 PM      Result Value Ref Range Status   Specimen Description BLOOD RIGHT ARM   Final   Special Requests BOTTLES DRAWN AEROBIC AND ANAEROBIC 10CC   Final   Culture  Setup Time     Final   Value: 07/23/2014 22:39     Performed at [facility]   Culture     Final   Value:        BLOOD CULTURE RECEIVED NO GROWTH TO DATE CULTURE WILL BE HELD FOR 5 DAYS BEFORE ISSUING A FINAL NEGATIVE REPORT     Performed at [facility]   Report Status PENDING   Incomplete  SURGICAL PCR SCREEN     Status: Abnormal   Collection Time    07/24/14  5:51 AM      Result Value Ref Range Status   MRSA, PCR  NEGATIVE  NEGATIVE Final   Staphylococcus aureus POSITIVE (*) NEGATIVE Final   Comment:            The Xpert SA Assay (FDA     approved for NASAL specimens     in patients over 53 years of age),     is one component of     a comprehensive surveillance     program.  Test performance has     been validated by [facility] for patients greater     than or equal to 53 year old.     It is not intended     to diagnose infection nor to     guide or monitor treatment.    Medical History: History reviewed. No pertinent past medical history.  Assessment: 53 y/o M w/ swelling and pain to right testicle w/ possible pain on urination.  Empiric antibiotics for potential cellulitis.  Zosyn now changed to unasyn  Blood cx--> Wound cx>>  Goal of Therapy:  Vancomycin trough 10-15 mcg/mL  Plan:  Unasyn 3gm IV q8 hours F/u renal function, clinical course and cultures   Bobby Ortiz 07/24/2014,12:15 PM

## 2014-07-24 NOTE — Progress Notes (Signed)
Infection is draining at the upper part of the right scrotal sac.  He probably would benefit from further drainage of the scrotum although he is not worsening.  Certaily can wait to see if he gets better by tomorrow.  Marta LamasJames O. Gae BonWyatt, [name], MD, FACS [phone]--pager [phone]--office [facility]

## 2014-07-24 NOTE — ED Provider Notes (Signed)
Medical screening examination/treatment/procedure(s) were conducted as a shared visit with non-physician practitioner(s) and myself.  I personally evaluated the patient during the encounter.  Concerning for scrotal cellulitis with possible developing abscess.  Patient be admitted the hospital for IV antibiotics.  Urology consultation had in the emergency department.  Definitely tender throughout his scrotum and right inguinal region with some drainage near his right proximal lateral scrotum concerning for draining abscess.  Nontoxic.  Overall well-appearing.  Patient is not a diabetic  Ct Pelvis W Contrast  07/23/2014   CLINICAL DATA:  Right-sided testicular swelling.Testicular swelling, right N50.8 (ICD-10-CM). Initial encounter.  EXAM: CT PELVIS WITH CONTRAST  TECHNIQUE: Multidetector CT imaging of the pelvis was performed using the standard protocol following the bolus administration of intravenous contrast.  CONTRAST:  100mL OMNIPAQUE IOHEXOL 300 MG/ML  SOLN  COMPARISON:  None.  FINDINGS: Normal imaged large and small bowel loops. Prior appendectomy per report.  Right external iliac node measures 1.7 cm on image 64 of series 3. Right inguinal node measures 1.8 cm on image 74.  Normal urinary bladder and prostate, without free pelvic fluid.  Bilateral fat containing inguinal hernias, small.  A fat containing ventral pelvic wall hernia. Nonspecific atrophy of the lower right rectus musculature.  Extensive skin thickening and subcutaneous edema is identified at the base of the scrotum, eccentric right. Example image 81 of series 3. There is diffuse scrotal edema and subcutaneous fluid. Testicles not well evaluated. No drainable fluid collection or evidence of soft tissue gas.  Degenerative partial fusion of the bilateral sacroiliac joints. Advanced degenerative disc disease involving the lumbosacral junction  IMPRESSION: 1. Findings most consistent with cellulitis involving the scrotum, especially eccentric  right. No evidence of abscess or soft tissue gas. Consider ultrasound evaluation of the scrotal contents. 2. Right pelvic adenopathy, favored to be reactive. Given the extent of adenopathy, pelvic CT followup at 3 to six-months should be considered to confirm resolution. 3. Fat containing bilateral inguinal and ventral pelvic wall hernias.   Electronically Signed   By: Jeronimo GreavesKyle  Talbot M.D.   On: 07/23/2014 18:25  I personally reviewed the imaging tests through PACS system I reviewed available ER/hospitalization records through the EMR   Lyanne CoKevin M Dyshon Philbin, MD 07/24/14 [phone]

## 2014-07-24 NOTE — Progress Notes (Signed)
Subjective: Continued drainage from scrotal area overnight, otherwise feels it is getting better. No fever chills.  Denies pain in area unless someone pressing on it. Objective: Vital signs in last 24 hours: Filed Vitals:   07/23/14 1745 07/23/14 2126 07/23/14 2157 07/24/14 0523  BP: 152/87 138/70 153/89 151/91  Pulse: 88 88 85 81  Temp:   99.5 F (37.5 C) 98.9 F (37.2 C)  TempSrc:      Resp:  16 16 16   Height:      Weight:      SpO2: 100% 97% 96% 97%   Weight change:   Intake/Output Summary (Last 24 hours) at 07/24/14 1129 Last data filed at 07/24/14 0803  Gross per 24 hour  Intake      0 ml  Output   1650 ml  Net  -1650 ml   General: resting in bed in NAD Cardiac: RRR, no rubs, murmurs or gallops Pulm: CTAB Abd: scrotal swelling and erythema still active purulent drainage, minimal tenderness  Ext: warm and well perfused, no pedal edema  Lab Results: Basic Metabolic Panel:  Recent Labs Lab 07/23/14 1535 07/24/14 0535  NA 138 135*  K 3.9 4.2  CL 100 99  CO2 23 23  GLUCOSE 103* 98  BUN 19 14  CREATININE 1.00 1.05  CALCIUM 9.4 8.7   Liver Function Tests: No results found for this basename: AST, ALT, ALKPHOS, BILITOT, PROT, ALBUMIN,  in the last 168 hours No results found for this basename: LIPASE, AMYLASE,  in the last 168 hours No results found for this basename: AMMONIA,  in the last 168 hours CBC:  Recent Labs Lab 07/23/14 1535 07/24/14 0535  WBC 14.9* 10.4  NEUTROABS 11.8*  --   HGB 12.3* 11.1*  HCT 36.6* 33.4*  MCV 92.7 93.6  PLT 309 271   Cardiac Enzymes: No results found for this basename: CKTOTAL, CKMB, CKMBINDEX, TROPONINI,  in the last 168 hours BNP: No results found for this basename: PROBNP,  in the last 168 hours D-Dimer: No results found for this basename: DDIMER,  in the last 168 hours CBG: No results found for this basename: GLUCAP,  in the last 168 hours Hemoglobin A1C: No results found for this basename: HGBA1C,  in the  last 168 hours Fasting Lipid Panel: No results found for this basename: CHOL, HDL, LDLCALC, TRIG, CHOLHDL, LDLDIRECT,  in the last 168 hours Thyroid Function Tests: No results found for this basename: TSH, T4TOTAL, FREET4, T3FREE, THYROIDAB,  in the last 168 hours Coagulation: No results found for this basename: LABPROT, INR,  in the last 168 hours Anemia Panel: No results found for this basename: VITAMINB12, FOLATE, FERRITIN, TIBC, IRON, RETICCTPCT,  in the last 168 hours Urine Drug Screen: Drugs of Abuse  No results found for this basename: labopia, cocainscrnur, labbenz, amphetmu, thcu, labbarb    Alcohol Level: No results found for this basename: ETH,  in the last 168 hours Urinalysis:  Recent Labs Lab 07/23/14 1520  COLORURINE YELLOW  LABSPEC 1.030  PHURINE 6.0  GLUCOSEU NEGATIVE  HGBUR NEGATIVE  BILIRUBINUR NEGATIVE  KETONESUR 15*  PROTEINUR NEGATIVE  UROBILINOGEN 0.2  NITRITE NEGATIVE  LEUKOCYTESUR NEGATIVE   Micro Results: Recent Results (from the past 240 hour(s))  CULTURE, BLOOD (ROUTINE X 2)     Status: None   Collection Time    07/23/14  4:25 PM      Result Value Ref Range Status   Specimen Description BLOOD LEFT ARM   Final  Special Requests BOTTLES DRAWN AEROBIC ONLY 10CC   Final   Culture  Setup Time     Final   Value: 07/23/2014 22:41     Performed at [facility]   Culture     Final   Value:        BLOOD CULTURE RECEIVED NO GROWTH TO DATE CULTURE WILL BE HELD FOR 5 DAYS BEFORE ISSUING A FINAL NEGATIVE REPORT     Performed at [facility]   Report Status PENDING   Incomplete  CULTURE, BLOOD (ROUTINE X 2)     Status: None   Collection Time    07/23/14  4:40 PM      Result Value Ref Range Status   Specimen Description BLOOD RIGHT ARM   Final   Special Requests BOTTLES DRAWN AEROBIC AND ANAEROBIC 10CC   Final   Culture  Setup Time     Final   Value: 07/23/2014 22:39     Performed at [facility]   Culture     Final    Value:        BLOOD CULTURE RECEIVED NO GROWTH TO DATE CULTURE WILL BE HELD FOR 5 DAYS BEFORE ISSUING A FINAL NEGATIVE REPORT     Performed at [facility]   Report Status PENDING   Incomplete  SURGICAL PCR SCREEN     Status: Abnormal   Collection Time    07/24/14  5:51 AM      Result Value Ref Range Status   MRSA, PCR NEGATIVE  NEGATIVE Final   Staphylococcus aureus POSITIVE (*) NEGATIVE Final   Comment:            The Xpert SA Assay (FDA     approved for NASAL specimens     in patients over 53 years of age),     is one component of     a comprehensive surveillance     program.  Test performance has     been validated by [facility] for patients greater     than or equal to 53 year old.     It is not intended     to diagnose infection nor to     guide or monitor treatment.   Studies/Results: Ct Pelvis W Contrast  07/23/2014   CLINICAL DATA:  Right-sided testicular swelling.Testicular swelling, right N50.8 (ICD-10-CM). Initial encounter.  EXAM: CT PELVIS WITH CONTRAST  TECHNIQUE: Multidetector CT imaging of the pelvis was performed using the standard protocol following the bolus administration of intravenous contrast.  CONTRAST:  100mL OMNIPAQUE IOHEXOL 300 MG/ML  SOLN  COMPARISON:  None.  FINDINGS: Normal imaged large and small bowel loops. Prior appendectomy per report.  Right external iliac node measures 1.7 cm on image 64 of series 3. Right inguinal node measures 1.8 cm on image 74.  Normal urinary bladder and prostate, without free pelvic fluid.  Bilateral fat containing inguinal hernias, small.  A fat containing ventral pelvic wall hernia. Nonspecific atrophy of the lower right rectus musculature.  Extensive skin thickening and subcutaneous edema is identified at the base of the scrotum, eccentric right. Example image 81 of series 3. There is diffuse scrotal edema and subcutaneous fluid. Testicles not well evaluated. No drainable fluid collection or evidence of soft tissue  gas.  Degenerative partial fusion of the bilateral sacroiliac joints. Advanced degenerative disc disease involving the lumbosacral junction  IMPRESSION: 1. Findings most consistent with cellulitis involving the scrotum, especially eccentric right. No evidence  of abscess or soft tissue gas. Consider ultrasound evaluation of the scrotal contents. 2. Right pelvic adenopathy, favored to be reactive. Given the extent of adenopathy, pelvic CT followup at 3 to six-months should be considered to confirm resolution. 3. Fat containing bilateral inguinal and ventral pelvic wall hernias.   Electronically Signed   By: Jeronimo Greaves M.D.   On: 07/23/2014 18:25   Medications: I have reviewed the patient's current medications. Scheduled Meds: . docusate sodium  100 mg Oral BID  . heparin  5,000 Units Subcutaneous 3 times per day  . vancomycin  1,250 mg Intravenous Q12H   Continuous Infusions:  PRN Meds:.morphine injection, ondansetron (ZOFRAN) IV, ondansetron Assessment/Plan:   Cellulitis of groin, right -On Vanc and Zosyn>> will change to Vanc and Unasyn No risk factors for Pseudomonas. - Morphine for pain control  - Gen Surgery signing off>> defer to Urology for surgical intervention - A1c pending - F/u blood cultures    HTN  - Not on medication. Will consider starting HCTZ prior to discharge versus at hospital follow up.   Dispo: Disposition is deferred at this time, awaiting improvement of current medical problems.  Anticipated discharge in approximately 1 day(s).   The patient does have a current PCP (No Pcp Per Patient) and does need an [facility] hospital follow-up appointment after discharge.  The patient does not have transportation limitations that hinder transportation to clinic appointments.  .Services Needed at time of discharge: Y = Yes, Blank = No PT:   OT:   RN:   Equipment:   Other:     LOS: 1 day   Gust Rung, DO 07/24/2014, 11:29 AM

## 2014-07-24 NOTE — Progress Notes (Signed)
Subjective:  [name] reports feeling well overnight, he has no pain at the site of infection currently, however does feel minor pain with movement and palpation of the area. He denies fevers, chills, nausea, vomiting, or other concerning symptoms.  Objective:  Vital signs in last 24 hours: Filed Vitals:   07/23/14 1745 07/23/14 2126 07/23/14 2157 07/24/14 0523  BP: 152/87 138/70 153/89 151/91  Pulse: 88 88 85 81  Temp:   99.5 F (37.5 C) 98.9 F (37.2 C)  TempSrc:      Resp:  16 16 16   Height:      Weight:      SpO2: 100% 97% 96% 97%   Weight change:   Intake/Output Summary (Last 24 hours) at 07/24/14 [id number] Last data filed at 07/24/14 [id number]  Gross per 24 hour  Intake      0 ml  Output   1650 ml  Net  -1650 ml   General: Adult male lying in bed, no acute distress Male genitalia: The right groin is swollen and erythematous with a 0.5cm open wound with pus draining upon palpation to the right of the base of the penile shaft. There is surrounding cellulitis and induration extending down the right side of the scrotum.  Lab Results: Labs have been reviewed and are documented in the EMR.  Micro Results: Results for orders placed during the hospital encounter of 07/23/14  SURGICAL PCR SCREEN     Status: Abnormal   Collection Time    07/24/14  5:51 AM      Result Value Ref Range Status   MRSA, PCR NEGATIVE  NEGATIVE Final   Staphylococcus aureus POSITIVE (*) NEGATIVE Final   Comment:            The Xpert SA Assay (FDA     approved for NASAL specimens     in patients over 53 years of age),     is one component of     a comprehensive surveillance     program.  Test performance has     been validated by [facility] for patients greater     than or equal to 53 year old.     It is not intended     to diagnose infection nor to     guide or monitor treatment.   Studies/Results: Ct Pelvis W Contrast  07/23/2014   CLINICAL DATA:  Right-sided testicular  swelling.Testicular swelling, right N50.8 (ICD-10-CM). Initial encounter.  EXAM: CT PELVIS WITH CONTRAST  TECHNIQUE: Multidetector CT imaging of the pelvis was performed using the standard protocol following the bolus administration of intravenous contrast.  CONTRAST:  100mL OMNIPAQUE IOHEXOL 300 MG/ML  SOLN  COMPARISON:  None.  FINDINGS: Normal imaged large and small bowel loops. Prior appendectomy per report.  Right external iliac node measures 1.7 cm on image 64 of series 3. Right inguinal node measures 1.8 cm on image 74.  Normal urinary bladder and prostate, without free pelvic fluid.  Bilateral fat containing inguinal hernias, small.  A fat containing ventral pelvic wall hernia. Nonspecific atrophy of the lower right rectus musculature.  Extensive skin thickening and subcutaneous edema is identified at the base of the scrotum, eccentric right. Example image 81 of series 3. There is diffuse scrotal edema and subcutaneous fluid. Testicles not well evaluated. No drainable fluid collection or evidence of soft tissue gas.  Degenerative partial fusion of the bilateral sacroiliac joints. Advanced degenerative disc disease involving the lumbosacral junction  IMPRESSION: 1.  Findings most consistent with cellulitis involving the scrotum, especially eccentric right. No evidence of abscess or soft tissue gas. Consider ultrasound evaluation of the scrotal contents. 2. Right pelvic adenopathy, favored to be reactive. Given the extent of adenopathy, pelvic CT followup at 3 to six-months should be considered to confirm resolution. 3. Fat containing bilateral inguinal and ventral pelvic wall hernias.   Electronically Signed   By: Jeronimo GreavesKyle  Talbot M.D.   On: 07/23/2014 18:25    Medications: Scheduled Meds: . docusate sodium  100 mg Oral BID  . heparin  5,000 Units Subcutaneous 3 times per day  . piperacillin-tazobactam (ZOSYN)  IV  3.375 g Intravenous Q8H  . vancomycin  1,250 mg Intravenous Q12H   Continuous Infusions:    PRN Meds:.morphine injection, ondansetron (ZOFRAN) IV, ondansetron  Assessment/Plan: Principal Problem:   Cellulitis of groin, right Active Problems:   Elevated blood pressure   Testicular swelling, right   Metabolic acidosis, increased anion gap  R groin & scrotal cellulitis with abscess:  Original imaging did not indicate presence of abscess but Gen Surg and Urology considered taking him for drainage.  Today Surgery signed off, and Urology will not attempt surgery either. They did not recommend further imaging. No h/o DM2 and blood glucose normal on BMP, but with significant infection and ketones in his urine, checking A1c.   - Unasyn  - Vanc 1250mg  BID - Morphine IV 1-2mg  q4h PRN pain - Gen Surg and Urology determined not to take for surgery   - Checking A1c   - Surg PCR screen MRSA negative, Staph aureus postivie - BCx x2 drawn in ED prior to abx administration   Anion gap: AG 15, Bicarb 23 on admission, AG13 and bicarb 23 next AM. Ketones present in his urine. Pt recvied 2L normal saline in the ED. Lactate 0.6.   HTN: Per pt, h/o HTN, and was previously treated with medication, unsure of the name. He states that he has been off the medication for a while b/c it made him "feel funny" and is controlling his BP with diet. On admission, SBP as high as 170s, likely pain induced, and improved with IV Morphine.  - Will monitor for now and may need to start a daily antihypertensive this admission.    FEN:  -regular diet  DVT PPx: [place] Heparin  Dispo: Disposition is deferred at this time, awaiting improvement of current medical problems. Anticipated discharge in approximately 1-2 day(s).   The patient does not have a current PCP (No Pcp Per Patient) and [facility].  The patient does not have transportation limitations that hinder transportation to clinic appointments.  This is a Psychologist, occupationalMedical Student Note.  The care of the patient was  discussed with Dr. Mikey BussingHoffman and the assessment and plan formulated with their assistance.  Please see their attached note for official documentation of the daily encounter.   LOS: 1 day   Tamera PuntBenjamin J Marquetta Weiskopf, Med Student  07/24/2014, 9:30 AM

## 2014-07-24 NOTE — Progress Notes (Signed)
  I have seen and examined the patient, and reviewed the daily progress note by Fulton ReekBen Lyles, MS 4 and discussed the care of the patient with them. Please see my progress note from 07/24/2014 for further details regarding assessment and plan.    Signed:  Gust RungErik C Saivion Goettel, DO 07/24/2014, 1:04 PM

## 2014-07-24 NOTE — Discharge Summary (Signed)
Incomplete 07/25/2014  Name: Dolores Mcgovern MRN: [id number] DOB: 04-23-1961 53 y.o. PCP: No PCP (Will follow up with [facility])  Date of Admission: 07/23/2014 Date of Discharge: 07/25/2014 Attending Physician: Inez Catalina, MD  Discharge Diagnosis: 1. Right Inguinal Abscess, Right Inguinal and Scrotal Cellulitis Principal Problem:   Cellulitis of groin, right Active Problems:   Elevated blood pressure   Testicular swelling, right   Metabolic acidosis, increased anion gap  Discharge Medications:   Medication List    ASK your doctor about these medications       aspirin EC 81 MG tablet  Take 81 mg by mouth daily.     ibuprofen 200 MG tablet  Commonly known as:  ADVIL,MOTRIN  Take 400-600 mg by mouth every 6 (six) hours as needed.       -Bactrim 800-160 BID x10 days  Disposition and follow-up:   Mr. Jovoni Borkenhagen was discharged from [facility] in Good condition.  At the hospital follow up visit please address:  1.  Right Inguinal Wound, abscess- Continued to resolve.  2.  Labs / imaging needed at time of follow-up: None  3.  Pending labs/ test needing follow-up  Follow-up Appointments: Follow-up Information   Follow up with [facility] On 07/31/2014. (For wound re-check @ 3:00PM)    Contact information:   [address] [place] [address]       Discharge Instructions:  Discharge Instructions    Call MD for:  temperature >100.4    Complete by:  As directed      Diet - low sodium heart healthy    Complete by:  As directed      Discharge instructions    Complete by:  As directed   Please follow up with your Urologist in 1 week, to be sure there are no complications. Also you need to follow up with a primary care doctor, as your blood pressure was high, and you have been started on a low dose of a blood pressure medication, which you should take once a day.     Discharge wound care:    Complete by:  As directed    1. Remove iodoform gauze packing. 2. Irrigate with 1/2 strength hydrogen peroxide followed by some sterile saline. 3. Gently repack with 1/2 inch iodoform gauze. 4. Cover the wound with dry gauze. 5. Secure with mesh panties or similar.     Increase activity slowly    Complete by:  As directed           Consultations: General Surgery, Urology  Procedures Performed:   Right Inguinal Abscess Incision and Drainage:  Ct Pelvis W Contrast  07/23/2014   CLINICAL DATA:  Right-sided testicular swelling.Testicular swelling, right N50.8 (ICD-10-CM). Initial encounter.  EXAM: CT PELVIS WITH CONTRAST  TECHNIQUE: Multidetector CT imaging of the pelvis was performed using the standard protocol following the bolus administration of intravenous contrast.  CONTRAST:  OMNIPAQUE IOHEXOL 300 MG/ML  SOLN  COMPARISON:  None.  FINDINGS: Normal imaged large and small bowel loops. Prior appendectomy per report.  Right external iliac node measures 1.7 cm on image 64 of series 3. Right inguinal node measures 1.8 cm on image 74.  Normal urinary bladder and prostate, without free pelvic fluid.  Bilateral fat containing inguinal hernias, small.  A fat containing ventral pelvic wall hernia. Nonspecific atrophy of the lower right rectus musculature.  Extensive skin thickening and subcutaneous edema is identified at the base of the  scrotum, eccentric right. Example image 81 of series 3. There is diffuse scrotal edema and subcutaneous fluid. Testicles not well evaluated. No drainable fluid collection or evidence of soft tissue gas.  Degenerative partial fusion of the bilateral sacroiliac joints. Advanced degenerative disc disease involving the lumbosacral junction  IMPRESSION: 1. Findings most consistent with cellulitis involving the scrotum, especially eccentric right. No evidence of abscess or soft tissue gas. Consider ultrasound evaluation of the scrotal contents. 2. Right pelvic adenopathy, favored to be reactive. Given  the extent of adenopathy, pelvic CT followup at 3 to six-months should be considered to confirm resolution. 3. Fat containing bilateral inguinal and ventral pelvic wall hernias.   Electronically Signed   By: Jeronimo GreavesKyle  Talbot M.D.   On: 07/23/2014 18:25   Admission HPI: Mr. Doylene Canardalley is a [age] year old man previously healthy presenting with right groin pain and swelling. He reports he shaved his pubic region on Friday. On Sunday, he noticed a painful nodule on his right groin adjacent to his scrotum. It became erythematous and edematous on Monday spreading to his scrotum. It had a pustule that he picked at yesterday (Tuesday). It drained pus - about 5ml total. The erythema and swelling has remained stable since yesterday. No previous similar episodes.   He has had a cough from a recent URI. Denies fevers, chills, vision changes, shortness of breath, chest pain, nausea, vomiting, abdominal pain, diarrhea, dysuria, hematuria, penile discharge, rash, bleeding/bruising, edema, arthralgias, myalgias, paresthesias, weakness, polyuria, polydipsia.   He reports he is sexually active with women. No history of sexually transmitted diseases. He uses condoms most of the time.   Hospital Course by problem list: Principal Problem:   Cellulitis of groin, right Active Problems:   Elevated blood pressure   Testicular swelling, right   Metabolic acidosis, increased anion gap  1. Right Groin and Scrotal cellulitis with abscess: In the ED, more draining pus was expressed from the wound by the ED physician who made the diagnosis of inguinal and scrotal cellulitis with possible development of abscess. Overall the patient appeared non-toxic with normal vital signs, except for an elevated blood pressure. A a CT Pelvis was contrast was ordered revealing findings most consistent with cellulitis involving the scrotum , with no evidence of abscess or soft tissue gas. The CT pelvis also revealed right pelvic adenopathy, favored to be  reactive. He was started on empiric Vancomycin and Zosyn for broad coverage.  Given his CT findings, clinical picture and concern for possible developing abscess, Urology were consulted for evaluation of the wound, pt had I and D done, was observed overnight with administration of antibiotics.  Pt was discharged home to complete 5 more days of Antibiotics. Was sent home with instructions on dressing changes and was scheduled for follow up. HIV was ordered to rule out causes of immunodefc, HgBA1c also ordered-5.9.  2. Hypertension: Bp elevated on admission SBP high of the 170's, diagnosed with HTN in the past with hypertension and was started on lisinopril, stopped meds- due to complaints of fatigue with meds. He was discharged home on Hydrochlorothiazide 25mg  started during admission.  3. Anion Gap: The patient had an elevated anion gap at 15 and a Bicarb of 23, with ketones in his urine on admission. Resolved after 2L of IVF.  Discharge Vitals:   BP 135/80  Pulse 67  Temp(Src) 97 F (36.1 C) (Oral)  Resp 22  Ht 6\' 3"  (1.905 m)  Wt 138.347 kg (305 lb)  BMI 38.12 kg/m2  SpO2 100%  Discharge Labs:  Results for orders placed during the hospital encounter of 07/23/14 (from the past 24 hour(s))  CBC     Status: Abnormal   Collection Time    07/25/14  9:32 AM      Result Value Ref Range   WBC 6.9  4.0 - 10.5 K/uL   RBC 3.86 (*) 4.22 - 5.81 MIL/uL   Hemoglobin 11.7 (*) 13.0 - 17.0 g/dL   HCT 16.134.6 (*) 09.639.0 - 04.552.0 %   MCV 89.6  78.0 - 100.0 fL   MCH 30.3  26.0 - 34.0 pg   MCHC 33.8  30.0 - 36.0 g/dL   RDW 40.912.2  81.111.5 - 91.415.5 %   Platelets 247  150 - 400 K/uL  BASIC METABOLIC PANEL     Status: Abnormal   Collection Time    07/25/14  9:32 AM      Result Value Ref Range   Sodium 135 (*) 137 - 147 mEq/L   Potassium 4.3  3.7 - 5.3 mEq/L   Chloride 100  96 - 112 mEq/L   CO2 22  19 - 32 mEq/L   Glucose, Bld 98  70 - 99 mg/dL   BUN 13  6 - 23 mg/dL   Creatinine, Ser 7.820.90  0.50 - 1.35 mg/dL    Calcium 9.1  8.4 - 95.610.5 mg/dL   GFR calc non Af Amer >90  >90 mL/min   GFR calc Af Amer >90  >90 mL/min   Anion gap 13  5 - 15   Signed: Onnie BoerEjiroghene E Dayvin Aber, MD 07/25/2014, 1:08 PM   Services Ordered on Discharge: None Equipment Ordered on Discharge: None

## 2014-07-24 NOTE — Progress Notes (Signed)
Subjective: Patient without complaints. No F/C. Pt seen by urology and Gen Surg x 2. I was asked to look at again today by medicine and surgery.   Objective: Vital signs in last 24 hours: Temp:  [98.9 F (37.2 C)-99.5 F (37.5 C)] 98.9 F (37.2 C) (10/29 0523) Pulse Rate:  [80-88] 81 (10/29 0523) Resp:  [16] 16 (10/29 0523) BP: (138-176)/(70-116) 151/91 mmHg (10/29 0523) SpO2:  [96 %-100 %] 97 % (10/29 0523) Weight:  [138.347 kg (305 lb)] 138.347 kg (305 lb) (10/28 1600)  Intake/Output from previous day: 10/28 0701 - 10/29 0700 In: -  Out: 1250 [Urine:1250] Intake/Output this shift: Total I/O In: -  Out: 750 [Urine:750]  Physical Exam:  Pt with erythema over inguinal region and simple scrotal edema. About a 2 cm area of fluctuance at top of scrotum, with pressure this quickly drains through open sinus tract in inguinal area. No necrosis of skin.   Lab Results:  Recent Labs  07/23/14 1535 07/24/14 0535  HGB 12.3* 11.1*  HCT 36.6* 33.4*   BMET  Recent Labs  07/23/14 1535 07/24/14 0535  NA 138 135*  K 3.9 4.2  CL 100 99  CO2 23 23  GLUCOSE 103* 98  BUN 19 14  CREATININE 1.00 1.05  CALCIUM 9.4 8.7   No results found for this basename: LABPT, INR,  in the last 72 hours No results found for this basename: LABURIN,  in the last 72 hours Results for orders placed during the hospital encounter of 07/23/14  CULTURE, BLOOD (ROUTINE X 2)     Status: None   Collection Time    07/23/14  4:25 PM      Result Value Ref Range Status   Specimen Description BLOOD LEFT ARM   Final   Special Requests BOTTLES DRAWN AEROBIC ONLY 10CC   Final   Culture  Setup Time     Final   Value: 07/23/2014 22:41     Performed at [facility]   Culture     Final   Value:        BLOOD CULTURE RECEIVED NO GROWTH TO DATE CULTURE WILL BE HELD FOR 5 DAYS BEFORE ISSUING A FINAL NEGATIVE REPORT     Performed at [facility]   Report Status PENDING   Incomplete  CULTURE, BLOOD  (ROUTINE X 2)     Status: None   Collection Time    07/23/14  4:40 PM      Result Value Ref Range Status   Specimen Description BLOOD RIGHT ARM   Final   Special Requests BOTTLES DRAWN AEROBIC AND ANAEROBIC 10CC   Final   Culture  Setup Time     Final   Value: 07/23/2014 22:39     Performed at [facility]   Culture     Final   Value:        BLOOD CULTURE RECEIVED NO GROWTH TO DATE CULTURE WILL BE HELD FOR 5 DAYS BEFORE ISSUING A FINAL NEGATIVE REPORT     Performed at [facility]   Report Status PENDING   Incomplete  SURGICAL PCR SCREEN     Status: Abnormal   Collection Time    07/24/14  5:51 AM      Result Value Ref Range Status   MRSA, PCR NEGATIVE  NEGATIVE Final   Staphylococcus aureus POSITIVE (*) NEGATIVE Final   Comment:            The Xpert SA Assay (FDA  approved for NASAL specimens     in patients over 53 years of age),     is one component of     a comprehensive surveillance     program.  Test performance has     been validated by [facility] for patients greater     than or equal to 53 year old.     It is not intended     to diagnose infection nor to     guide or monitor treatment.  WOUND CULTURE     Status: None   Collection Time    07/24/14  6:00 AM      Result Value Ref Range Status   Specimen Description WOUND TESTICLE   Final   Special Requests NONE   Final   Gram Stain     Final   Value: ABUNDANT WBC PRESENT,BOTH PMN AND MONONUCLEAR     NO SQUAMOUS EPITHELIAL CELLS SEEN     MODERATE GRAM POSITIVE COCCI     IN PAIRS FEW GRAM POSITIVE RODS     Performed at [facility]   Culture PENDING   Incomplete   Report Status PENDING   Incomplete    Studies/Results: Ct Pelvis W Contrast  07/23/2014   CLINICAL DATA:  Right-sided testicular swelling.Testicular swelling, right N50.8 (ICD-10-CM). Initial encounter.  EXAM: CT PELVIS WITH CONTRAST  TECHNIQUE: Multidetector CT imaging of the pelvis was performed using the standard  protocol following the bolus administration of intravenous contrast.  CONTRAST:  100mL OMNIPAQUE IOHEXOL 300 MG/ML  SOLN  COMPARISON:  None.  FINDINGS: Normal imaged large and small bowel loops. Prior appendectomy per report.  Right external iliac node measures 1.7 cm on image 64 of series 3. Right inguinal node measures 1.8 cm on image 74.  Normal urinary bladder and prostate, without free pelvic fluid.  Bilateral fat containing inguinal hernias, small.  A fat containing ventral pelvic wall hernia. Nonspecific atrophy of the lower right rectus musculature.  Extensive skin thickening and subcutaneous edema is identified at the base of the scrotum, eccentric right. Example image 81 of series 3. There is diffuse scrotal edema and subcutaneous fluid. Testicles not well evaluated. No drainable fluid collection or evidence of soft tissue gas.  Degenerative partial fusion of the bilateral sacroiliac joints. Advanced degenerative disc disease involving the lumbosacral junction  IMPRESSION: 1. Findings most consistent with cellulitis involving the scrotum, especially eccentric right. No evidence of abscess or soft tissue gas. Consider ultrasound evaluation of the scrotal contents. 2. Right pelvic adenopathy, favored to be reactive. Given the extent of adenopathy, pelvic CT followup at 3 to six-months should be considered to confirm resolution. 3. Fat containing bilateral inguinal and ventral pelvic wall hernias.   Electronically Signed   By: Jeronimo GreavesKyle  Talbot M.D.   On: 07/23/2014 18:25    Assessment/Plan: right inguinal abscess with cellulitis of groin and scrotal edema - area continues to drain well, no necrosis, WBC count down, no fevers. I would continue current plan, he seems to be improving.     LOS: 1 day   Jerilee Fieldskridge, Kay Ricciuti 07/24/2014, 3:27 PM

## 2014-07-25 ENCOUNTER — Encounter (HOSPITAL_COMMUNITY)
Admission: EM | Disposition: A | Discharge status: Home / Self Care | Payer: Self-pay | Source: Home / Self Care | Attending: Internal Medicine

## 2014-07-25 ENCOUNTER — Inpatient Hospital Stay (HOSPITAL_COMMUNITY): Payer: BC Managed Care – PPO | Admitting: Certified Registered"

## 2014-07-25 ENCOUNTER — Encounter (HOSPITAL_COMMUNITY): Payer: Self-pay | Admitting: Certified Registered"

## 2014-07-25 ENCOUNTER — Other Ambulatory Visit: Payer: Self-pay | Admitting: Urology

## 2014-07-25 ENCOUNTER — Encounter (HOSPITAL_COMMUNITY): Payer: BC Managed Care – PPO | Admitting: Certified Registered"

## 2014-07-25 HISTORY — PX: IRRIGATION AND DEBRIDEMENT ABSCESS: SHX5252

## 2014-07-25 LAB — CBC
HCT: 34.6 % — ABNORMAL LOW (ref 39.0–52.0)
Hemoglobin: 11.7 g/dL — ABNORMAL LOW (ref 13.0–17.0)
MCH: 30.3 pg (ref 26.0–34.0)
MCHC: 33.8 g/dL (ref 30.0–36.0)
MCV: 89.6 fL (ref 78.0–100.0)
Platelets: 247 10*3/uL (ref 150–400)
RBC: 3.86 MIL/uL — ABNORMAL LOW (ref 4.22–5.81)
RDW: 12.2 % (ref 11.5–15.5)
WBC: 6.9 10*3/uL (ref 4.0–10.5)

## 2014-07-25 LAB — BASIC METABOLIC PANEL
Anion gap: 13 (ref 5–15)
BUN: 13 mg/dL (ref 6–23)
CO2: 22 mEq/L (ref 19–32)
Calcium: 9.1 mg/dL (ref 8.4–10.5)
Chloride: 100 mEq/L (ref 96–112)
Creatinine, Ser: 0.9 mg/dL (ref 0.50–1.35)
GFR calc Af Amer: >90 mL/min (ref >90)
GFR calc non Af Amer: >90 mL/min (ref >90)
Glucose, Bld: 98 mg/dL (ref 70–99)
Potassium: 4.3 mEq/L (ref 3.7–5.3)
Sodium: 135 mEq/L — ABNORMAL LOW (ref 137–147)

## 2014-07-25 LAB — HIV ANTIBODY (ROUTINE TESTING W REFLEX): HIV 1&2 Ab, 4th Generation: NONREACTIVE

## 2014-07-25 SURGERY — IRRIGATION AND DEBRIDEMENT ABSCESS
Anesthesia: General | Site: Groin | Laterality: Right

## 2014-07-25 MED ORDER — MIDAZOLAM HCL 2 MG/2ML IJ SOLN
INTRAMUSCULAR | Status: AC
  Filled 2014-07-25: qty 2

## 2014-07-25 MED ORDER — ROCURONIUM BROMIDE 50 MG/5ML IV SOLN
INTRAVENOUS | Status: AC
  Filled 2014-07-25: qty 1

## 2014-07-25 MED ORDER — LIDOCAINE HCL (CARDIAC) 20 MG/ML IV SOLN
INTRAVENOUS | Status: DC | PRN
  Administered 2014-07-25: 80 mg via INTRAVENOUS

## 2014-07-25 MED ORDER — 0.9 % SODIUM CHLORIDE (POUR BTL) OPTIME
TOPICAL | Status: DC | PRN
  Administered 2014-07-25: 1000 mL

## 2014-07-25 MED ORDER — HYDROCHLOROTHIAZIDE 25 MG PO TABS
25.0000 mg | ORAL_TABLET | Freq: Every day | ORAL | Status: DC
  Administered 2014-07-25 – 2014-07-26 (×2): 25 mg via ORAL
  Filled 2014-07-25 (×2): qty 1

## 2014-07-25 MED ORDER — SULFAMETHOXAZOLE-TMP DS 800-160 MG PO TABS
1.0000 | ORAL_TABLET | Freq: Two times a day (BID) | ORAL | Status: DC
  Administered 2014-07-25 – 2014-07-26 (×3): 1 via ORAL
  Filled 2014-07-25 (×4): qty 1

## 2014-07-25 MED ORDER — ACETAMINOPHEN 10 MG/ML IV SOLN
INTRAVENOUS | Status: DC | PRN
  Administered 2014-07-25: 1000 mg via INTRAVENOUS

## 2014-07-25 MED ORDER — SUCCINYLCHOLINE CHLORIDE 20 MG/ML IJ SOLN
INTRAMUSCULAR | Status: AC
  Filled 2014-07-25: qty 1

## 2014-07-25 MED ORDER — FENTANYL CITRATE 0.05 MG/ML IJ SOLN
INTRAMUSCULAR | Status: DC | PRN
  Administered 2014-07-25: 100 ug via INTRAVENOUS
  Administered 2014-07-25: 50 ug via INTRAVENOUS

## 2014-07-25 MED ORDER — MIDAZOLAM HCL 5 MG/5ML IJ SOLN
INTRAMUSCULAR | Status: DC | PRN
  Administered 2014-07-25: 2 mg via INTRAVENOUS

## 2014-07-25 MED ORDER — ONDANSETRON HCL 4 MG/2ML IJ SOLN
INTRAMUSCULAR | Status: DC | PRN
  Administered 2014-07-25: 4 mg via INTRAVENOUS

## 2014-07-25 MED ORDER — ACETAMINOPHEN 10 MG/ML IV SOLN
INTRAVENOUS | Status: AC
  Filled 2014-07-25: qty 100

## 2014-07-25 MED ORDER — PHENYLEPHRINE 40 MCG/ML (10ML) SYRINGE FOR IV PUSH (FOR BLOOD PRESSURE SUPPORT)
PREFILLED_SYRINGE | INTRAVENOUS | Status: AC
  Filled 2014-07-25: qty 10

## 2014-07-25 MED ORDER — LACTATED RINGERS IV SOLN
INTRAVENOUS | Status: DC
  Administered 2014-07-25: 12:00:00 via INTRAVENOUS

## 2014-07-25 MED ORDER — LACTATED RINGERS IV SOLN
INTRAVENOUS | Status: DC | PRN
  Administered 2014-07-25: 12:00:00 via INTRAVENOUS

## 2014-07-25 MED ORDER — PHENYLEPHRINE HCL 10 MG/ML IJ SOLN
INTRAMUSCULAR | Status: DC | PRN
  Administered 2014-07-25: 80 ug via INTRAVENOUS

## 2014-07-25 MED ORDER — PROPOFOL 10 MG/ML IV BOLUS
INTRAVENOUS | Status: AC
  Filled 2014-07-25: qty 20

## 2014-07-25 MED ORDER — KETOROLAC TROMETHAMINE 30 MG/ML IJ SOLN
INTRAMUSCULAR | Status: DC | PRN
  Administered 2014-07-25: 30 mg via INTRAVENOUS

## 2014-07-25 MED ORDER — PROPOFOL 10 MG/ML IV BOLUS
INTRAVENOUS | Status: DC | PRN
  Administered 2014-07-25: 200 mg via INTRAVENOUS

## 2014-07-25 MED ORDER — LIDOCAINE HCL (CARDIAC) 20 MG/ML IV SOLN
INTRAVENOUS | Status: AC
  Filled 2014-07-25: qty 5

## 2014-07-25 MED ORDER — FENTANYL CITRATE 0.05 MG/ML IJ SOLN
INTRAMUSCULAR | Status: AC
  Filled 2014-07-25: qty 5

## 2014-07-25 MED ORDER — LISINOPRIL 10 MG PO TABS
10.0000 mg | ORAL_TABLET | Freq: Every day | ORAL | Status: DC
  Filled 2014-07-25: qty 1

## 2014-07-25 SURGICAL SUPPLY — 42 items
BANDAGE GAUZE 4  KLING STR (GAUZE/BANDAGES/DRESSINGS) IMPLANT
BLADE 10 SAFETY STRL DISP (BLADE) ×2 IMPLANT
BLADE SURG 10 STRL SS (BLADE) IMPLANT
BLADE SURG 15 STRL LF DISP TIS (BLADE) IMPLANT
BLADE SURG 15 STRL SS (BLADE)
BLADE SURG ROTATE 9660 (MISCELLANEOUS) IMPLANT
BNDG CONFORM 2 STRL LF (GAUZE/BANDAGES/DRESSINGS) IMPLANT
BNDG GAUZE ELAST 4 BULKY (GAUZE/BANDAGES/DRESSINGS) IMPLANT
BUCKET BIOHAZARD WASTE 5 GAL (MISCELLANEOUS) ×1 IMPLANT
COVER SURGICAL LIGHT HANDLE (MISCELLANEOUS) ×2 IMPLANT
DRAIN PENROSE 1/4X12 LTX STRL (WOUND CARE) IMPLANT
DRESSING TELFA 8X3 (GAUZE/BANDAGES/DRESSINGS) IMPLANT
DRSG PAD ABDOMINAL 8X10 ST (GAUZE/BANDAGES/DRESSINGS) ×3 IMPLANT
ELECT REM PT RETURN 9FT ADLT (ELECTROSURGICAL) ×2
ELECTRODE REM PT RTRN 9FT ADLT (ELECTROSURGICAL) ×1 IMPLANT
GAUZE IODOFORM PACK 1/2 7832 (GAUZE/BANDAGES/DRESSINGS) ×1 IMPLANT
GAUZE SPONGE 4X4 16PLY XRAY LF (GAUZE/BANDAGES/DRESSINGS) ×2 IMPLANT
GLOVE BIO SURGEON STRL SZ7 (GLOVE) ×1 IMPLANT
GLOVE BIO SURGEON STRL SZ8 (GLOVE) ×3 IMPLANT
GLOVE BIOGEL PI IND STRL 7.0 (GLOVE) IMPLANT
GLOVE BIOGEL PI INDICATOR 7.0 (GLOVE) ×1
GOWN STRL REUS W/ TWL XL LVL3 (GOWN DISPOSABLE) ×1 IMPLANT
GOWN STRL REUS W/TWL XL LVL3 (GOWN DISPOSABLE) ×4
H R LUBE JELLY XXX (MISCELLANEOUS) ×1 IMPLANT
KIT BASIN OR (CUSTOM PROCEDURE TRAY) ×2 IMPLANT
KIT ROOM TURNOVER OR (KITS) ×2 IMPLANT
NDL HYPO 25GX1X1/2 BEV (NEEDLE) IMPLANT
NEEDLE HYPO 25GX1X1/2 BEV (NEEDLE) IMPLANT
NS IRRIG 1000ML POUR BTL (IV SOLUTION) ×2 IMPLANT
PACK CYSTOSCOPY (CUSTOM PROCEDURE TRAY) ×2 IMPLANT
PAD ARMBOARD 7.5X6 YLW CONV (MISCELLANEOUS) ×3 IMPLANT
PENCIL BUTTON HOLSTER BLD 10FT (ELECTRODE) ×2 IMPLANT
SPONGE GAUZE 4X4 12PLY STER LF (GAUZE/BANDAGES/DRESSINGS) ×1 IMPLANT
SUT CHROMIC 3 0 SH 27 (SUTURE) ×1 IMPLANT
SWAB COLLECTION DEVICE MRSA (MISCELLANEOUS) ×1 IMPLANT
SYR BULB IRRIGATION 50ML (SYRINGE) ×2 IMPLANT
SYR CONTROL 10ML LL (SYRINGE) IMPLANT
TOWEL OR 17X24 6PK STRL BLUE (TOWEL DISPOSABLE) ×2 IMPLANT
TOWEL OR 17X26 10 PK STRL BLUE (TOWEL DISPOSABLE) ×2 IMPLANT
TUBE ANAEROBIC SPECIMEN COL (MISCELLANEOUS) ×1 IMPLANT
WATER STERILE IRR 1000ML POUR (IV SOLUTION) ×2 IMPLANT
YANKAUER SUCT BULB TIP NO VENT (SUCTIONS) ×2 IMPLANT

## 2014-07-25 NOTE — Progress Notes (Signed)
Report given to Philip RN

## 2014-07-25 NOTE — Progress Notes (Signed)
  I have seen and examined the patient, and reviewed the daily progress note by Fulton ReekBen Lyles, MS 4 and discussed the care of the patient with them. Please see my progress note from 07/25/2014 for further details regarding assessment and plan.    Signed:  Gust RungErik C Sayyid Harewood, DO 07/25/2014, 1:58 PM

## 2014-07-25 NOTE — Progress Notes (Signed)
Wound vac with iodoform

## 2014-07-25 NOTE — Progress Notes (Signed)
Subjective: Afebrile overnight, inguinal area continues to drain purulent material.  Minimal pain but is tender to palpation. Objective: Vital signs in last 24 hours: Filed Vitals:   07/25/14 1300 07/25/14 1315 07/25/14 1321 07/25/14 1342  BP: 135/80 134/79  132/90  Pulse: 67 66  70  Temp:   97.5 F (36.4 C) 97.7 F (36.5 C)  TempSrc:    Oral  Resp: 22 21  18   Height:      Weight:      SpO2: 100% 95%  95%   Weight change:   Intake/Output Summary (Last 24 hours) at 07/25/14 1354 Last data filed at 07/25/14 1300  Gross per 24 hour  Intake   1650 ml  Output    750 ml  Net    900 ml   General: resting in bed in NAD Cardiac: RRR, no rubs, murmurs or gallops Pulm: CTAB Abd: scrotal swelling and erythema still active purulent drainage from open wound, minimal tenderness  Ext: warm and well perfused, no pedal edema  Lab Results: Basic Metabolic Panel:  Recent Labs Lab 07/24/14 0535 07/25/14 0932  NA 135* 135*  K 4.2 4.3  CL 99 100  CO2 23 22  GLUCOSE 98 98  BUN 14 13  CREATININE 1.05 0.90  CALCIUM 8.7 9.1   Liver Function Tests: No results found for this basename: AST, ALT, ALKPHOS, BILITOT, PROT, ALBUMIN,  in the last 168 hours No results found for this basename: LIPASE, AMYLASE,  in the last 168 hours No results found for this basename: AMMONIA,  in the last 168 hours CBC:  Recent Labs Lab 07/23/14 1535 07/24/14 0535 07/25/14 0932  WBC 14.9* 10.4 6.9  NEUTROABS 11.8*  --   --   HGB 12.3* 11.1* 11.7*  HCT 36.6* 33.4* 34.6*  MCV 92.7 93.6 89.6  PLT 309 271 247   Cardiac Enzymes: No results found for this basename: CKTOTAL, CKMB, CKMBINDEX, TROPONINI,  in the last 168 hours BNP: No results found for this basename: PROBNP,  in the last 168 hours D-Dimer: No results found for this basename: DDIMER,  in the last 168 hours CBG: No results found for this basename: GLUCAP,  in the last 168 hours Hemoglobin A1C:  Recent Labs Lab 07/24/14 0535  HGBA1C  5.9*   Fasting Lipid Panel: No results found for this basename: CHOL, HDL, LDLCALC, TRIG, CHOLHDL, LDLDIRECT,  in the last 168 hours Thyroid Function Tests: No results found for this basename: TSH, T4TOTAL, FREET4, T3FREE, THYROIDAB,  in the last 168 hours Coagulation: No results found for this basename: LABPROT, INR,  in the last 168 hours Anemia Panel: No results found for this basename: VITAMINB12, FOLATE, FERRITIN, TIBC, IRON, RETICCTPCT,  in the last 168 hours Urine Drug Screen: Drugs of Abuse  No results found for this basename: labopia,  cocainscrnur,  labbenz,  amphetmu,  thcu,  labbarb    Alcohol Level: No results found for this basename: ETH,  in the last 168 hours Urinalysis:  Recent Labs Lab 07/23/14 1520  COLORURINE YELLOW  LABSPEC 1.030  PHURINE 6.0  GLUCOSEU NEGATIVE  HGBUR NEGATIVE  BILIRUBINUR NEGATIVE  KETONESUR 15*  PROTEINUR NEGATIVE  UROBILINOGEN 0.2  NITRITE NEGATIVE  LEUKOCYTESUR NEGATIVE   Micro Results: Recent Results (from the past 240 hour(s))  CULTURE, BLOOD (ROUTINE X 2)     Status: None   Collection Time    07/23/14  4:25 PM      Result Value Ref Range Status   Specimen Description  BLOOD LEFT ARM   Final   Special Requests BOTTLES DRAWN AEROBIC ONLY 10CC   Final   Culture  Setup Time     Final   Value: 07/23/2014 22:41     Performed at [facility]   Culture     Final   Value:        BLOOD CULTURE RECEIVED NO GROWTH TO DATE CULTURE WILL BE HELD FOR 5 DAYS BEFORE ISSUING A FINAL NEGATIVE REPORT     Performed at [facility]   Report Status PENDING   Incomplete  CULTURE, BLOOD (ROUTINE X 2)     Status: None   Collection Time    07/23/14  4:40 PM      Result Value Ref Range Status   Specimen Description BLOOD RIGHT ARM   Final   Special Requests BOTTLES DRAWN AEROBIC AND ANAEROBIC 10CC   Final   Culture  Setup Time     Final   Value: 07/23/2014 22:39     Performed at [facility]   Culture     Final   Value:         BLOOD CULTURE RECEIVED NO GROWTH TO DATE CULTURE WILL BE HELD FOR 5 DAYS BEFORE ISSUING A FINAL NEGATIVE REPORT     Performed at [facility]   Report Status PENDING   Incomplete  SURGICAL PCR SCREEN     Status: Abnormal   Collection Time    07/24/14  5:51 AM      Result Value Ref Range Status   MRSA, PCR NEGATIVE  NEGATIVE Final   Staphylococcus aureus POSITIVE (*) NEGATIVE Final   Comment:            The Xpert SA Assay (FDA     approved for NASAL specimens     in patients over 53 years of age),     is one component of     a comprehensive surveillance     program.  Test performance has     been validated by [facility] for patients greater     than or equal to 53 year old.     It is not intended     to diagnose infection nor to     guide or monitor treatment.  WOUND CULTURE     Status: None   Collection Time    07/24/14  6:00 AM      Result Value Ref Range Status   Specimen Description WOUND TESTICLE   Final   Special Requests NONE   Final   Gram Stain     Final   Value: ABUNDANT WBC PRESENT,BOTH PMN AND MONONUCLEAR     NO SQUAMOUS EPITHELIAL CELLS SEEN     MODERATE GRAM POSITIVE COCCI     IN PAIRS FEW GRAM POSITIVE RODS     Performed at [facility]   Culture     Final   Value: ABUNDANT STAPHYLOCOCCUS AUREUS     Note: RIFAMPIN AND GENTAMICIN SHOULD NOT BE USED AS SINGLE DRUGS FOR TREATMENT OF STAPH INFECTIONS.     Performed at [facility]   Report Status PENDING   Incomplete   Studies/Results: Ct Pelvis W Contrast  07/23/2014   CLINICAL DATA:  Right-sided testicular swelling.Testicular swelling, right N50.8 (ICD-10-CM). Initial encounter.  EXAM: CT PELVIS WITH CONTRAST  TECHNIQUE: Multidetector CT imaging of the pelvis was performed using the standard protocol following the bolus administration of intravenous contrast.  CONTRAST:  OMNIPAQUE IOHEXOL 300 MG/ML  SOLN  COMPARISON:  None.  FINDINGS: Normal imaged large and small bowel  loops. Prior appendectomy per report.  Right external iliac node measures 1.7 cm on image 64 of series 3. Right inguinal node measures 1.8 cm on image 74.  Normal urinary bladder and prostate, without free pelvic fluid.  Bilateral fat containing inguinal hernias, small.  A fat containing ventral pelvic wall hernia. Nonspecific atrophy of the lower right rectus musculature.  Extensive skin thickening and subcutaneous edema is identified at the base of the scrotum, eccentric right. Example image 81 of series 3. There is diffuse scrotal edema and subcutaneous fluid. Testicles not well evaluated. No drainable fluid collection or evidence of soft tissue gas.  Degenerative partial fusion of the bilateral sacroiliac joints. Advanced degenerative disc disease involving the lumbosacral junction  IMPRESSION: 1. Findings most consistent with cellulitis involving the scrotum, especially eccentric right. No evidence of abscess or soft tissue gas. Consider ultrasound evaluation of the scrotal contents. 2. Right pelvic adenopathy, favored to be reactive. Given the extent of adenopathy, pelvic CT followup at 3 to six-months should be considered to confirm resolution. 3. Fat containing bilateral inguinal and ventral pelvic wall hernias.   Electronically Signed   By: Jeronimo Greaves M.D.   On: 07/23/2014 18:25   Medications: I have reviewed the patient's current medications. Scheduled Meds: . docusate sodium  100 mg Oral BID  . heparin  5,000 Units Subcutaneous 3 times per day  . hydrochlorothiazide  25 mg Oral Daily  . sulfamethoxazole-trimethoprim  1 tablet Oral Q12H   Continuous Infusions: . lactated ringers 10 mL/hr at 07/25/14 1139   PRN Meds:.morphine injection, ondansetron (ZOFRAN) IV, ondansetron Assessment/Plan:   Cellulitis of groin, right - Vanc and Unasyn >> will change to Bactrim DS given S aureus from wound culture. - Urology to take to OR today to facilitate drainage by I&D - A1c 5.9%, checking HIV - BCx  NGTD, Wound culture moderate S aureus    HTN  - Start HCTZ 25mg  Daily  FEN:  -regular diet  DVT PPx: Heparin [place] Dispo: Disposition is deferred at this time, awaiting improvement of current medical problems.  Anticipated discharge in approximately 1 day(s).   The patient does have a current PCP (No Pcp Per Patient) and does need an [facility] hospital follow-up appointment after discharge.  The patient does not have transportation limitations that hinder transportation to clinic appointments.  .Services Needed at time of discharge: Y = Yes, Blank = No PT:   OT:   RN:   Equipment:   Other:     LOS: 2 days   Gust Rung, DO 07/25/2014, 1:54 PM

## 2014-07-25 NOTE — Transfer of Care (Signed)
Immediate Anesthesia Transfer of Care Note  Patient: Bobby PickettMarshall Dues  Procedure(s) Performed: Procedure(s): IRRIGATION AND DEBRIDEMENT ABSCESS RIGHT GROIN (Right)  Patient Location: PACU  Anesthesia Type:General  Level of Consciousness: awake, alert  and sedated  Airway & Oxygen Therapy: Patient connected to face mask oxygen  Post-op Assessment: Report given to PACU RN  Post vital signs: stable  Complications: No apparent anesthesia complications

## 2014-07-25 NOTE — Progress Notes (Signed)
Subjective: Patient reports that he has no groin pain unless pressure is applied to the area of infection.  Objective: Vital signs in last 24 hours: Temp:  [98.6 F (37 C)-99.6 F (37.6 C)] 98.6 F (37 C) (10/30 0557) Pulse Rate:  [74-79] 74 (10/30 0557) Resp:  [16] 16 (10/30 0557) BP: (140-156)/(88-98) 156/98 mmHg (10/30 0557) SpO2:  [95 %-97 %] 95 % (10/30 0557)  Intake/Output from previous day: 10/29 0701 - 10/30 0700 In: 850 [I.V.:250; IV Piggyback:600] Out: 1200 [Urine:1200] Intake/Output this shift:    Physical Exam:  Based on the outline of previous erythema marked there appears to beless erythema however he continues to have thick purulent drainage and fluctuance noted in the inferior inguinal/superior groin region.  Lab Results:  Recent Labs  07/23/14 1535 07/24/14 0535  HGB 12.3* 11.1*  HCT 36.6* 33.4*   BMET  Recent Labs  07/23/14 1535 07/24/14 0535  NA 138 135*  K 3.9 4.2  CL 100 99  CO2 23 23  GLUCOSE 103* 98  BUN 19 14  CREATININE 1.00 1.05  CALCIUM 9.4 8.7   No results found for this basename: LABPT, INR,  in the last 72 hours No results found for this basename: LABURIN,  in the last 72 hours Results for orders placed during the hospital encounter of 07/23/14  CULTURE, BLOOD (ROUTINE X 2)     Status: None   Collection Time    07/23/14  4:25 PM      Result Value Ref Range Status   Specimen Description BLOOD LEFT ARM   Final   Special Requests BOTTLES DRAWN AEROBIC ONLY 10CC   Final   Culture  Setup Time     Final   Value: 07/23/2014 22:41     Performed at [facility]   Culture     Final   Value:        BLOOD CULTURE RECEIVED NO GROWTH TO DATE CULTURE WILL BE HELD FOR 5 DAYS BEFORE ISSUING A FINAL NEGATIVE REPORT     Performed at [facility]   Report Status PENDING   Incomplete  CULTURE, BLOOD (ROUTINE X 2)     Status: None   Collection Time    07/23/14  4:40 PM      Result Value Ref Range Status   Specimen  Description BLOOD RIGHT ARM   Final   Special Requests BOTTLES DRAWN AEROBIC AND ANAEROBIC 10CC   Final   Culture  Setup Time     Final   Value: 07/23/2014 22:39     Performed at [facility]   Culture     Final   Value:        BLOOD CULTURE RECEIVED NO GROWTH TO DATE CULTURE WILL BE HELD FOR 5 DAYS BEFORE ISSUING A FINAL NEGATIVE REPORT     Performed at [facility]   Report Status PENDING   Incomplete  SURGICAL PCR SCREEN     Status: Abnormal   Collection Time    07/24/14  5:51 AM      Result Value Ref Range Status   MRSA, PCR NEGATIVE  NEGATIVE Final   Staphylococcus aureus POSITIVE (*) NEGATIVE Final   Comment:            The Xpert SA Assay (FDA     approved for NASAL specimens     in patients over 70 years of age),     is one component of     a comprehensive surveillance  program.  Test performance has     been validated by [facility] for patients greater     than or equal to 53 year old.     It is not intended     to diagnose infection nor to     guide or monitor treatment.  WOUND CULTURE     Status: None   Collection Time    07/24/14  6:00 AM      Result Value Ref Range Status   Specimen Description WOUND TESTICLE   Final   Special Requests NONE   Final   Gram Stain     Final   Value: ABUNDANT WBC PRESENT,BOTH PMN AND MONONUCLEAR     NO SQUAMOUS EPITHELIAL CELLS SEEN     MODERATE GRAM POSITIVE COCCI     IN PAIRS FEW GRAM POSITIVE RODS     Performed at [facility]   Culture     Final   Value: ABUNDANT STAPHYLOCOCCUS AUREUS     Note: RIFAMPIN AND GENTAMICIN SHOULD NOT BE USED AS SINGLE DRUGS FOR TREATMENT OF STAPH INFECTIONS.     Performed at [facility]   Report Status PENDING   Incomplete    Studies/Results: Ct Pelvis W Contrast  07/23/2014   CLINICAL DATA:  Right-sided testicular swelling.Testicular swelling, right N50.8 (ICD-10-CM). Initial encounter.  EXAM: CT PELVIS WITH CONTRAST  TECHNIQUE: Multidetector CT  imaging of the pelvis was performed using the standard protocol following the bolus administration of intravenous contrast.  CONTRAST:  100mL OMNIPAQUE IOHEXOL 300 MG/ML  SOLN  COMPARISON:  None.  FINDINGS: Normal imaged large and small bowel loops. Prior appendectomy per report.  Right external iliac node measures 1.7 cm on image 64 of series 3. Right inguinal node measures 1.8 cm on image 74.  Normal urinary bladder and prostate, without free pelvic fluid.  Bilateral fat containing inguinal hernias, small.  A fat containing ventral pelvic wall hernia. Nonspecific atrophy of the lower right rectus musculature.  Extensive skin thickening and subcutaneous edema is identified at the base of the scrotum, eccentric right. Example image 81 of series 3. There is diffuse scrotal edema and subcutaneous fluid. Testicles not well evaluated. No drainable fluid collection or evidence of soft tissue gas.  Degenerative partial fusion of the bilateral sacroiliac joints. Advanced degenerative disc disease involving the lumbosacral junction  IMPRESSION: 1. Findings most consistent with cellulitis involving the scrotum, especially eccentric right. No evidence of abscess or soft tissue gas. Consider ultrasound evaluation of the scrotal contents. 2. Right pelvic adenopathy, favored to be reactive. Given the extent of adenopathy, pelvic CT followup at 3 to six-months should be considered to confirm resolution. 3. Fat containing bilateral inguinal and ventral pelvic wall hernias.   Electronically Signed   By: Jeronimo GreavesKyle  Talbot M.D.   On: 07/23/2014 18:25    Assessment/Plan: Although his white count has improved and he is afebrile with some improvement he clearly has fluctuance and is draining thick purulent fluid.  It's quite uncomfortable to express any fluid from this area and I discussed opening the area further in order to facilitate better drainage.  I told him this could be done at the bedside although he was unwilling to attempt  that and therefore I will schedule him for surgical drainage of his abscess.  This will allow him to improve much more quickly and get him out of the hospital sooner.  I think this is clearly indicated.  I therefore have discussed with  the patient the incision that I would use, the need for packing, the probability of success and the anticipated postoperative course.  He understands and is elected to proceed.  N.p.o.  To OR  today for incision and drainage of his abscess.   LOS: 2 days   Vivianna Piccini C 07/25/2014, 8:00 AM

## 2014-07-25 NOTE — Discharge Instructions (Signed)
Abscess An abscess is an infected area that contains a collection of pus and debris.It can occur in almost any part of the body. An abscess is also known as a furuncle or boil. CAUSES  An abscess occurs when tissue gets infected. This can occur from blockage of oil or sweat glands, infection of hair follicles, or a minor injury to the skin. As the body tries to fight the infection, pus collects in the area and creates pressure under the skin. This pressure causes pain. People with weakened immune systems have difficulty fighting infections and get certain abscesses more often.  SYMPTOMS Usually an abscess develops on the skin and becomes a painful mass that is red, warm, and tender. If the abscess forms under the skin, you may feel a moveable soft area under the skin. Some abscesses break open (rupture) on their own, but most will continue to get worse without care. The infection can spread deeper into the body and eventually into the bloodstream, causing you to feel ill.  DIAGNOSIS  Your caregiver will take your medical history and perform a physical exam. A sample of fluid may also be taken from the abscess to determine what is causing your infection. TREATMENT  Your caregiver may prescribe antibiotic medicines to fight the infection. However, taking antibiotics alone usually does not cure an abscess. Your caregiver may need to make a small cut (incision) in the abscess to drain the pus. In some cases, gauze is packed into the abscess to reduce pain and to continue draining the area. HOME CARE INSTRUCTIONS   Only take over-the-counter or prescription medicines for pain, discomfort, or fever as directed by your caregiver.  If you were prescribed antibiotics, take them as directed. Finish them even if you start to feel better.  If gauze is used, follow your caregiver's directions for changing the gauze.  To avoid spreading the infection:  Keep your draining abscess covered with a  bandage.  Wash your hands well.  Do not share personal care items, towels, or whirlpools with others.  Avoid skin contact with others.  Keep your skin and clothes clean around the abscess.  Keep all follow-up appointments as directed by your caregiver. SEEK MEDICAL CARE IF:   You have increased pain, swelling, redness, fluid drainage, or bleeding.  You have muscle aches, chills, or a general ill feeling.  You have a fever. MAKE SURE YOU:   Understand these instructions.  Will watch your condition.  Will get help right away Cellulitis Cellulitis is an infection of the skin and the tissue under the skin. The infected area is usually red and tender. This happens most often in the arms and lower legs. HOME CARE  Take your antibiotic medicine as told. Finish the medicine even if you start to feel better. Keep the infected arm or leg raised (elevated). Put a warm cloth on the area up to 4 times per day. Only take medicines as told by your doctor. Keep all doctor visits as told. GET HELP IF: You see red streaks on the skin coming from the infected area. Your red area gets bigger or turns a dark color. Your bone or joint under the infected area is painful after the skin heals. Your infection comes back in the same area or different area. You have a puffy (swollen) bump in the infected area. You have new symptoms. You have a fever. GET HELP RIGHT AWAY IF:  You feel very sleepy. You throw up (vomit) or have watery poop (diarrhea).  You feel sick and have muscle aches and pains. MAKE SURE YOU:  Understand these instructions. Will watch your condition. Will get help right away if you are not doing well or get worse. Document Released: 02/29/2008 Document Revised: 01/27/2014 Document Reviewed: 11/28/2011 [facility] Patient Information 2015 [place], [place]. This information is not intended to replace advice given to you by your health care provider. Make sure you discuss any  questions you have with your health care provider.   if you are not doing well or get worse. Document Released: 06/22/2005 Document Revised: 03/13/2012 Document Reviewed: 11/25/2011 [facility] Patient Information 2015 [place], [place]. This information is not intended to replace advice given to you by your health care provider. Make sure you discuss any questions you have with your health care provider.  Abscess Care After An abscess (also called a boil or furuncle) is an infected area that contains a collection of pus. Signs and symptoms of an abscess include pain, tenderness, redness, or hardness, or you may feel a moveable soft area under your skin. An abscess can occur anywhere in the body. The infection may spread to surrounding tissues causing cellulitis. A cut (incision) by the surgeon was made over your abscess and the pus was drained out. Gauze may have been packed into the space to provide a drain that will allow the cavity to heal from the inside outwards. The boil may be painful for 5 to 7 days. Most people with a boil do not have high fevers. Your abscess, if seen early, may not have localized, and may not have been lanced. If not, another appointment may be required for this if it does not get better on its own or with medications. HOME CARE INSTRUCTIONS   Only take over-the-counter or prescription medicines for pain, discomfort, or fever as directed by your caregiver.  When you bathe, soak and then remove gauze or iodoform packs at least daily or as directed by your caregiver. You may then wash the wound gently with mild soapy water. Repack with gauze or do as your caregiver directs. SEEK IMMEDIATE MEDICAL CARE IF:   You develop increased pain, swelling, redness, drainage, or bleeding in the wound site.  You develop signs of generalized infection including muscle aches, chills, fever, or a general ill feeling.  An oral temperature above 102 F (38.9 C) develops, not controlled by  medication. See your caregiver for a recheck if you develop any of the symptoms described above. If medications (antibiotics) were prescribed, take them as directed. Document Released: 03/31/2005 Document Revised: 12/05/2011 Document Reviewed: 11/26/2007 [facility] Patient Information 2015 [place], [place]. This information is not intended to replace advice given to you by your health care provider. Make sure you discuss any questions you have with your health care provider.

## 2014-07-25 NOTE — Op Note (Signed)
PATIENT:  Bobby PickettMarshall Ortiz  PRE-OPERATIVE DIAGNOSIS: Right groin/scrotal abscess  POST-OPERATIVE DIAGNOSIS: Same  PROCEDURE: Incision and drainage of Right scrotal/groin abscess  SURGEON:  Garnett FarmMark C England Greb  INDICATION: Bobby Ortiz is a 53 year old male with cellulitis and an abscess in the right groin extending down into the superior right hemiscrotum that recently pointed and began to drain.  He is not having a great deal of pain, his white blood cell count has improved and he is not having any fever but continues to have fluctuance and drainage of thick, foul-smelling purulent fluid when this area is palpated and it was my opinion that surgical drainage was indicated.  I discussed this with the patient and felt it could be performed at the bedside although he was unwilling and therefore is brought to the operating room for this procedure.  ANESTHESIA:  General  EBL:  Minimal  DRAINS: None  LOCAL MEDICATIONS USED:  None  SPECIMEN:  Aerobic and anaerobic cultures of the purulent material  Description of procedure: After informed consent the patient was taken to the operating room and placed on the table in a supine position. General anesthesia was then administered. Once fully anesthetized the patient was moved to the dorsal lithotomy position and the genitalia were sterilely prepped and draped in standard fashion. An official timeout was then performed.  Examination revealed fluctuance in the inferior right groin region and a second area that appeared to be possibly separate in the superior right hemiscrotal region with drainage from an area of the skin surrounded by very erythematous skin.  I made a transverse incision initially over the most fluctuant area in the superior aspect of the right hemiscrotum and then obtained cultures from the purulent material within.  I extended this cephalad and was able to insert an index finger and palpate the abscess cavity.  It tracked cephalad and was  contiguous with the area in the inferior right groin.  I therefore extended my incision in this direction and was able to open this completely and irrigated it copiously with sterile saline.  The tissue appeared viable and there was no evidence of skin breakdown or extension into the inferior scrotum or inguinal region superiorly.  The area that had pointed did appear to be of questionable viability and therefore I made a transverse incision at the midportion of the previous vertical incision and ellipsed out the tract that was draining the purulentand appeared to be of questionable viability.  I then irrigated the wound copiously again and visually and digitally palpated the wound extensively finding no further areas of extension of the abscess cavity.   I placed interrupted 3-0 chromic sutures in the transverse incision to reapproximate this but left the vertical incision open and packed the wound with half inch iodoform gauze, applied 4 x 4's, an ABD and then used mesh panties to secure the dressing.  The patient was awakened and taken to the recovery room in stable and satisfactory condition.  He tolerated the procedure well with no intraoperative complications.  He will be maintained on intravenous antibiotics while awaiting his final culture results.    PLAN OF CARE: Discharge to home after PACU  PATIENT DISPOSITION:  PACU - hemodynamically stable.

## 2014-07-25 NOTE — Anesthesia Preprocedure Evaluation (Addendum)
Anesthesia Evaluation  Patient identified by MRN, date of birth, ID band Patient awake    Reviewed: Allergy & Precautions, H&P , NPO status , Patient's Chart, lab work & pertinent test results, reviewed documented beta blocker date and time   Airway        Dental   Pulmonary neg pulmonary ROS,          Cardiovascular hypertension, Pt. on medications  Likely untreated Htn   Neuro/Psych negative neurological ROS  negative psych ROS   GI/Hepatic negative GI ROS, Neg liver ROS,   Endo/Other  Morbid obesity  Renal/GU negative Renal ROS     Musculoskeletal negative musculoskeletal ROS (+)   Abdominal   Peds  Hematology negative hematology ROS (+)   Anesthesia Other Findings   Reproductive/Obstetrics negative OB ROS                           Anesthesia Physical Anesthesia Plan  ASA: II  Anesthesia Plan: General   Post-op Pain Management:    Induction:   Airway Management Planned:   Additional Equipment:   Intra-op Plan:   Post-operative Plan:   Informed Consent:   Plan Discussed with:   Anesthesia Plan Comments:         Anesthesia Quick Evaluation

## 2014-07-25 NOTE — Anesthesia Procedure Notes (Signed)
Procedure Name: LMA Insertion Date/Time: 07/25/2014 11:56 AM Performed by: Ellin GoodieWEAVER, Maximo Spratling M Pre-anesthesia Checklist: Patient identified, Emergency Drugs available, Suction available, Patient being monitored and Timeout performed Patient Re-evaluated:Patient Re-evaluated prior to inductionOxygen Delivery Method: Circle system utilized Preoxygenation: Pre-oxygenation with 100% oxygen Intubation Type: IV induction Ventilation: Mask ventilation without difficulty LMA: LMA inserted LMA Size: 5.0 Number of attempts: 1 Placement Confirmation: positive ETCO2 and breath sounds checked- equal and bilateral Tube secured with: Tape Dental Injury: Teeth and Oropharynx as per pre-operative assessment

## 2014-07-25 NOTE — Progress Notes (Signed)
Subjective:  [name] continues to deny pain at the wound site except with deep palpation and expression of fluid. Otherwise he feels well and is awaiting surgery today for incision and drainage of his abscess.  Objective:  Vital signs in last 24 hours: Filed Vitals:   07/24/14 2100 07/25/14 0557 07/25/14 0815 07/25/14 1048  BP: 152/93 156/98 158/103 158/104  Pulse: 79 74 75 75  Temp: 99.2 F (37.3 C) 98.6 F (37 C) 98.2 F (36.8 C)   TempSrc: Oral Oral Oral   Resp: 16 16 18    Height:      Weight:      SpO2: 96% 95% 98%    Weight change:   Intake/Output Summary (Last 24 hours) at 07/25/14 1128 Last data filed at 07/25/14 1000  Gross per 24 hour  Intake    850 ml  Output   1100 ml  Net   -250 ml   General: Adult male lying in bed, no acute distress CV: RRR, Nml S1, S2, no murmurs Resp: CTAB, no wheezes or crackles Abd: Soft, NTND, Bowel sounds normal Male genitalia: The right groin is swollen and erythematous with a 0.5cm open wound with pus draining upon palpation to the right of the base of the penile shaft. There is surrounding cellulitis and induration extending down the right side of the scrotum.  Lab Results: Labs have been reviewed and are documented in the EMR.  Micro Results: Results for orders placed during the hospital encounter of 07/23/14  CULTURE, BLOOD (ROUTINE X 2)     Status: None   Collection Time    07/23/14  4:25 PM      Result Value Ref Range Status   Specimen Description BLOOD LEFT ARM   Final   Special Requests BOTTLES DRAWN AEROBIC ONLY 10CC   Final   Culture  Setup Time     Final   Value: 07/23/2014 22:41     Performed at [facility]   Culture     Final   Value:        BLOOD CULTURE RECEIVED NO GROWTH TO DATE CULTURE WILL BE HELD FOR 5 DAYS BEFORE ISSUING A FINAL NEGATIVE REPORT     Performed at [facility]   Report Status PENDING   Incomplete  CULTURE, BLOOD (ROUTINE X 2)     Status: None   Collection Time   07/23/14  4:40 PM      Result Value Ref Range Status   Specimen Description BLOOD RIGHT ARM   Final   Special Requests BOTTLES DRAWN AEROBIC AND ANAEROBIC 10CC   Final   Culture  Setup Time     Final   Value: 07/23/2014 22:39     Performed at [facility]   Culture     Final   Value:        BLOOD CULTURE RECEIVED NO GROWTH TO DATE CULTURE WILL BE HELD FOR 5 DAYS BEFORE ISSUING A FINAL NEGATIVE REPORT     Performed at [facility]   Report Status PENDING   Incomplete  SURGICAL PCR SCREEN     Status: Abnormal   Collection Time    07/24/14  5:51 AM      Result Value Ref Range Status   MRSA, PCR NEGATIVE  NEGATIVE Final   Staphylococcus aureus POSITIVE (*) NEGATIVE Final   Comment:            The Xpert SA Assay (FDA     approved for NASAL  specimens     in patients over 53 years of age),     is one component of     a comprehensive surveillance     program.  Test performance has     been validated by [facility] for patients greater     than or equal to 53 year old.     It is not intended     to diagnose infection nor to     guide or monitor treatment.  WOUND CULTURE     Status: None   Collection Time    07/24/14  6:00 AM      Result Value Ref Range Status   Specimen Description WOUND TESTICLE   Final   Special Requests NONE   Final   Gram Stain     Final   Value: ABUNDANT WBC PRESENT,BOTH PMN AND MONONUCLEAR     NO SQUAMOUS EPITHELIAL CELLS SEEN     MODERATE GRAM POSITIVE COCCI     IN PAIRS FEW GRAM POSITIVE RODS     Performed at [facility]   Culture     Final   Value: ABUNDANT STAPHYLOCOCCUS AUREUS     Note: RIFAMPIN AND GENTAMICIN SHOULD NOT BE USED AS SINGLE DRUGS FOR TREATMENT OF STAPH INFECTIONS.     Performed at [facility]   Report Status PENDING   Incomplete   Studies/Results: No results found.  Medications: Scheduled Meds: . [MAR HOLD] ampicillin-sulbactam (UNASYN) IV  3 g Intravenous Q8H  . [facility][MAR HOLD] docusate sodium   100 mg Oral BID  . [MAR HOLD] heparin  5,000 Units Subcutaneous 3 times per day  . [MAR HOLD] lisinopril  10 mg Oral Daily  . [facility][MAR HOLD] vancomycin  1,250 mg Intravenous Q12H   Continuous Infusions: . lactated ringers     PRN Meds:.[facility][MAR HOLD]  morphine injection, [MAR HOLD] ondansetron (ZOFRAN) IV, [MAR HOLD] ondansetron  Assessment/Plan: Principal Problem:   Cellulitis of groin, right Active Problems:   Elevated blood pressure   Testicular swelling, right   Metabolic acidosis, increased anion gap  R groin & scrotal cellulitis with abscess:  Original imaging did not indicate presence of abscess but Gen Surg and Urology considered taking him for drainage.  Wound cultures grew abundant staph aureus on hospital day 2, nasal swab on admission was negative for MRSA, positive for MSSA.  After re-evaluation on hospital day 2 by Urology they will be taking him for I&D for potential abscess. Afterwards could potentially go home on oral bactrim for treatment of staph aureus.  - I&D of abscess today, potentially go home on Bactrim. - Unasyn  - Vanc 1250mg  BID - Morphine IV 1-2mg  q4h PRN pain - Checking HIV ab  - Surg PCR screen MRSA negative, Staph aureus postivie - BCx x2 drawn in ED prior to abx administration   Anion gap: AG 15, Bicarb 23 on admission, AG13 and bicarb 23 next AM. Ketones present in his urine. Pt recvied 2L normal saline in the ED. Lactate 0.6.   HTN: Patient has history of hypertension, was treated on Lisinopril but said that it made him feel very fatigued, his BP has remained elevated during his stay despite adequate pain control. Will start on HCTZ and d/c on this medication. -Start HCTZ 25mg  daily    FEN:  -regular diet  DVT PPx: [place] Heparin  Dispo: Disposition is deferred at this time, awaiting outcome of surgery. Anticipated discharge in approximately 0-1 day(s).   The  patient does not have a current PCP (No Pcp Per Patient) and [facility]  The patient does not have transportation limitations that hinder transportation to clinic appointments.  This is a Psychologist, occupationalMedical Student Note.  The care of the patient was discussed with Dr. Mikey BussingHoffman and the assessment and plan formulated with their assistance.  Please see their attached note for official documentation of the daily encounter.   LOS: 2 days   Tamera PuntBenjamin J Natallia Stellmach, Med Student  07/25/2014, 11:28 AM

## 2014-07-25 NOTE — Progress Notes (Signed)
Heparin scheduled

## 2014-07-26 LAB — WOUND CULTURE

## 2014-07-26 MED ORDER — HYDROCHLOROTHIAZIDE 25 MG PO TABS: 25.0000 mg | ORAL_TABLET | Freq: Every day | ORAL | Status: DC

## 2014-07-26 MED ORDER — OXYCODONE-ACETAMINOPHEN 5-325 MG PO TABS
1.0000 | ORAL_TABLET | Freq: Four times a day (QID) | ORAL | Status: DC | PRN
  Administered 2014-07-26: 2 via ORAL
  Filled 2014-07-26: qty 2

## 2014-07-26 MED ORDER — SULFAMETHOXAZOLE-TMP DS 800-160 MG PO TABS: 1.0000 | ORAL_TABLET | Freq: Two times a day (BID) | ORAL | Status: DC

## 2014-07-26 MED ORDER — OXYCODONE-ACETAMINOPHEN 5-325 MG PO TABS: 1.0000 | ORAL_TABLET | Freq: Four times a day (QID) | ORAL | Status: DC | PRN

## 2014-07-26 NOTE — Progress Notes (Signed)
I have discussed case with Dr. Mariea ClontsEmokpae and agree with documentation as outlined

## 2014-07-26 NOTE — Progress Notes (Signed)
Patient ID: Bobby Ortiz, male   DOB: [birth year], 53 y.o.   MRN: [id number] 1 Day Post-Op Subjective: Patient is without complaint. Specifically denies any groin pain. He has been up and out of bed and not had significant discomfort. He said overall he feels much better.  Objective: Vital signs in last 24 hours: Temp:  [97 F (36.1 C)-99.2 F (37.3 C)] 99 F (37.2 C) (10/31 0500) Pulse Rate:  [64-75] 75 (10/31 0500) Resp:  [18-22] 18 (10/31 0500) BP: (120-158)/(63-104) 154/99 mmHg (10/31 0500) SpO2:  [95 %-100 %] 95 % (10/31 0500)  Intake/Output from previous day: 10/30 0701 - 10/31 0700 In: 1400 [P.O.:600; I.V.:800] Out: 1950 [Urine:1950] Intake/Output this shift: Total I/O In: 240 [P.O.:240] Out: -   History reviewed. No pertinent past medical history. Current Facility-Administered Medications  Medication Dose Route Frequency Provider Last Rate Last Dose  . docusate sodium (COLACE) capsule 100 mg  100 mg Oral BID Genelle GatherKathryn F Glenn, MD   100 mg at 07/25/14 2112  . heparin injection 5,000 Units  5,000 Units Subcutaneous 3 times per day Genelle GatherKathryn F Glenn, MD   5,000 Units at 07/25/14 1426  . hydrochlorothiazide (HYDRODIURIL) tablet 25 mg  25 mg Oral Daily Gust RungErik C Hoffman, DO   25 mg at 07/25/14 1430  . lactated ringers infusion   Intravenous Continuous Arta BruceKevin Ossey, MD 10 mL/hr at 07/25/14 1139    . morphine 2 MG/ML injection 1-2 mg  1-2 mg Intravenous Q4H PRN Genelle GatherKathryn F Glenn, MD   2 mg at 07/24/14 1554  . ondansetron (ZOFRAN) tablet 4 mg  4 mg Oral Q6H PRN Genelle GatherKathryn F Glenn, MD       Or  . ondansetron [facility](ZOFRAN) injection 4 mg  4 mg Intravenous Q6H PRN Genelle GatherKathryn F Glenn, MD      . sulfamethoxazole-trimethoprim (BACTRIM DS) 800-160 MG per tablet 1 tablet  1 tablet Oral Q12H Gust RungErik C Hoffman, DO   1 tablet at 07/25/14 2112    Physical Exam:  General: Patient is in no apparent distress The erythema involving the inguinal region has diminished and there is some decrease in his scrotal edema.  There is no evidence of purulent discharge and his incision looks good with packing in place.    Lab Results:  Recent Labs  07/23/14 1535 07/24/14 0535 07/25/14 0932  WBC 14.9* 10.4 6.9  HGB 12.3* 11.1* 11.7*  HCT 36.6* 33.4* 34.6*   BMET  Recent Labs  07/24/14 0535 07/25/14 0932  NA 135* 135*  K 4.2 4.3  CL 99 100  CO2 23 22  GLUCOSE 98 98  BUN 14 13  CREATININE 1.05 0.90  CALCIUM 8.7 9.1   No results found for this basename: LABPT, INR,  in the last 72 hours No results found for this basename: LABURIN,  in the last 72 hours Results for orders placed during the hospital encounter of 07/23/14  CULTURE, BLOOD (ROUTINE X 2)     Status: None   Collection Time    07/23/14  4:25 PM      Result Value Ref Range Status   Specimen Description BLOOD LEFT ARM   Final   Special Requests BOTTLES DRAWN AEROBIC ONLY 10CC   Final   Culture  Setup Time     Final   Value: 07/23/2014 22:41     Performed at [facility]   Culture     Final   Value:        BLOOD CULTURE RECEIVED NO GROWTH TO DATE  CULTURE WILL BE HELD FOR 5 DAYS BEFORE ISSUING A FINAL NEGATIVE REPORT     Performed at [facility]   Report Status PENDING   Incomplete  CULTURE, BLOOD (ROUTINE X 2)     Status: None   Collection Time    07/23/14  4:40 PM      Result Value Ref Range Status   Specimen Description BLOOD RIGHT ARM   Final   Special Requests BOTTLES DRAWN AEROBIC AND ANAEROBIC 10CC   Final   Culture  Setup Time     Final   Value: 07/23/2014 22:39     Performed at [facility]   Culture     Final   Value:        BLOOD CULTURE RECEIVED NO GROWTH TO DATE CULTURE WILL BE HELD FOR 5 DAYS BEFORE ISSUING A FINAL NEGATIVE REPORT     Performed at [facility]   Report Status PENDING   Incomplete  SURGICAL PCR SCREEN     Status: Abnormal   Collection Time    07/24/14  5:51 AM      Result Value Ref Range Status   MRSA, PCR NEGATIVE  NEGATIVE Final   Staphylococcus aureus POSITIVE  (*) NEGATIVE Final   Comment:            The Xpert SA Assay (FDA     approved for NASAL specimens     in patients over 41 years of age),     is one component of     a comprehensive surveillance     program.  Test performance has     been validated by [employer] for patients greater     than or equal to 53 year old.     It is not intended     to diagnose infection nor to     guide or monitor treatment.  WOUND CULTURE     Status: None   Collection Time    07/24/14  6:00 AM      Result Value Ref Range Status   Specimen Description WOUND TESTICLE   Final   Special Requests NONE   Final   Gram Stain     Final   Value: ABUNDANT WBC PRESENT,BOTH PMN AND MONONUCLEAR     NO SQUAMOUS EPITHELIAL CELLS SEEN     MODERATE GRAM POSITIVE COCCI     IN PAIRS FEW GRAM POSITIVE RODS     Performed at [facility]   Culture     Final   Value: ABUNDANT STAPHYLOCOCCUS AUREUS     Note: RIFAMPIN AND GENTAMICIN SHOULD NOT BE USED AS SINGLE DRUGS FOR TREATMENT OF STAPH INFECTIONS.     Performed at [facility]   Report Status 07/26/2014 FINAL   Final   Organism ID, Bacteria STAPHYLOCOCCUS AUREUS   Final  CULTURE, ROUTINE-ABSCESS     Status: None   Collection Time    07/25/14 12:40 PM      Result Value Ref Range Status   Specimen Description ABSCESS RIGHT GROIN   Final   Special Requests POF UNASYN AND VANCOMYCIN   Final   Gram Stain     Final   Value: MODERATE WBC PRESENT,BOTH PMN AND MONONUCLEAR     NO SQUAMOUS EPITHELIAL CELLS SEEN     RARE GRAM POSITIVE COCCI     IN PAIRS IN CLUSTERS     Performed at [facility]  Final   Value: Culture reincubated for better growth     Performed at [facility]  [facility] Report Status PENDING   Incomplete  ANAEROBIC CULTURE     Status: None   Collection Time    07/25/14 12:40 PM      Result Value Ref Range Status   Specimen Description ABSCESS RIGHT GROIN   Final   Special Requests POF UNASYN AND VANCOMYCIN    Final   Gram Stain     Final   Value: MODERATE WBC PRESENT,BOTH PMN AND MONONUCLEAR     NO SQUAMOUS EPITHELIAL CELLS SEEN     RARE GRAM POSITIVE COCCI     IN PAIRS IN CLUSTERS     Performed at [facility]   Culture PENDING   Incomplete   Report Status PENDING   Incomplete    Studies/Results: No results found.  Assessment/Plan: He is doing well now status post incision and drainage of his right inguinal/superior scrotal abscess. His culture has grown staph. He remains afebrile with a normal white blood cell count. We discussed dressing/wound care today. I have informed him that I will want him to manage his wound in the following manner: 1. Remove iodoform gauze packing. 2. Irrigate with 1/2 strength hydrogen peroxide followed by some sterile saline. 3. Gently repack with 1/2 inch iodoform gauze. 4. Cover the wound with dry gauze. 5. Secure with mesh panties or similar.  It would appear from a urologic standpoint that he could be discharged today. He will be taught how to perform dressing changes and once he has mastered this could potentially be discharged on appropriate antibiotics for his staph infection for a period of 7 days with follow-up in my office in one week.  Taleigh Gero C 07/26/2014, 8:53 AM

## 2014-07-26 NOTE — Progress Notes (Signed)
   Subjective: No complaints today. Pain has reduced significantly. Mild drainage. No fevers, no chills, good appetite. Will like to leave today. Pt shown how to do dressings, but ex-wife to come in today to be taught has pts body habitus, limits his vision, and therefore needs help with dressings.   Objective: Vital signs in last 24 hours: Filed Vitals:   07/25/14 1321 07/25/14 1342 07/25/14 2238 07/26/14 0500  BP:  132/90 147/90 154/99  Pulse:  70 70 75  Temp: 97.5 F (36.4 C) 97.7 F (36.5 C) 99.2 F (37.3 C) 99 F (37.2 C)  TempSrc:  Oral Oral Oral  Resp:  18 18 18   Height:      Weight:      SpO2:  95% 97% 95%   Weight change:   Intake/Output Summary (Last 24 hours) at 07/26/14 1357 Last data filed at 07/26/14 1300  Gross per 24 hour  Intake    960 ml  Output   1450 ml  Net   -490 ml   Physical Exam-  General appearance: alert, cooperative, appears stated age and no distress Lungs: clear to auscultation bilaterally Heart: regular rate and rhythm, S1, S2 normal, no murmur, click, rub or gallop Abdomen: soft, non-tender; bowel sounds normal; no masses,  no organomegaly Groin: dressing present Extremities: extremities normal, atraumatic, no cyanosis or edema  Lab Results: Basic Metabolic Panel:  Recent Labs Lab 07/24/14 0535 07/25/14 0932  NA 135* 135*  K 4.2 4.3  CL 99 100  CO2 23 22  GLUCOSE 98 98  BUN 14 13  CREATININE 1.05 0.90  CALCIUM 8.7 9.1   CBC:  Recent Labs Lab 07/23/14 1535 07/24/14 0535 07/25/14 0932  WBC 14.9* 10.4 6.9  NEUTROABS 11.8*  --   --   HGB 12.3* 11.1* 11.7*  HCT 36.6* 33.4* 34.6*  MCV 92.7 93.6 89.6  PLT 309 271 247   Hemoglobin A1C:  Recent Labs Lab 07/24/14 0535  HGBA1C 5.9*   Urinalysis:  Recent Labs Lab 07/23/14 1520  COLORURINE YELLOW  LABSPEC 1.030  PHURINE 6.0  GLUCOSEU NEGATIVE  HGBUR NEGATIVE  BILIRUBINUR NEGATIVE  KETONESUR 15*  PROTEINUR NEGATIVE  UROBILINOGEN 0.2  NITRITE NEGATIVE    LEUKOCYTESUR NEGATIVE  PRN Meds:.ondansetron (ZOFRAN) IV, ondansetron, oxyCODONE-acetaminophen Assessment/Plan:  Cellulitis of groin, right - Abscess Drained in the OR by urology- 07/26/2014 - On Bactrim- 2nd day. Will discharge home on Bactrim- DS- TID for 7 days total, stop date- 07/31/2014. - Abscess cultures- Gram positive cocci in clusters. - Continue home dressings. - Pt to follow up with Urology in a week. - Pain regimen- Percocets- 5-325- 1-2 tabs TID #30.  HTN  - Start HCTZ 25mg  Daily   FEN:  -regular diet   DVT PPx: Heparin [place]  Dispo: Discharge Today.  The patient does have a current PCP (No Pcp Per Patient) and [facility].  The patient does not have transportation limitations that hinder transportation to clinic appointments.  .Services Needed at time of discharge: Y = Yes, Blank = No PT:   OT:   RN:   Equipment:   Other:     LOS: 3 days   Onnie BoerEjiroghene E Carie Kapuscinski, MD 07/26/2014, 1:57 PM

## 2014-07-28 ENCOUNTER — Encounter (HOSPITAL_COMMUNITY): Payer: Self-pay | Admitting: Urology

## 2014-07-28 LAB — CULTURE, ROUTINE-ABSCESS

## 2014-07-29 LAB — CULTURE, BLOOD (ROUTINE X 2)
Culture: NO GROWTH
Culture: NO GROWTH

## 2014-07-30 LAB — ANAEROBIC CULTURE

## 2014-07-30 NOTE — Anesthesia Postprocedure Evaluation (Signed)
  Anesthesia Post-op Note  Patient: Bobby Ortiz  Procedure(s) Performed: Procedure(s): IRRIGATION AND DEBRIDEMENT ABSCESS RIGHT GROIN (Right)  Patient Location: PACU  Anesthesia Type:General  Level of Consciousness: awake and alert   Airway and Oxygen Therapy: Patient Spontanous Breathing  Post-op Pain: mild  Post-op Assessment: Post-op Vital signs reviewed  Post-op Vital Signs: stable  Last Vitals:  Filed Vitals:   07/26/14 0500  BP: 154/99  Pulse: 75  Temp: 37.2 C  Resp: 18    Complications: No apparent anesthesia complications

## 2014-07-31 ENCOUNTER — Ambulatory Visit: Payer: BC Managed Care – PPO | Admitting: Internal Medicine

## 2014-07-31 ENCOUNTER — Encounter: Payer: Self-pay | Admitting: General Practice

## 2016-10-24 ENCOUNTER — Ambulatory Visit (INDEPENDENT_AMBULATORY_CARE_PROVIDER_SITE_OTHER): Payer: Worker's Compensation

## 2016-10-24 ENCOUNTER — Ambulatory Visit (INDEPENDENT_AMBULATORY_CARE_PROVIDER_SITE_OTHER): Payer: Worker's Compensation | Admitting: Family

## 2016-10-24 ENCOUNTER — Encounter: Payer: Self-pay | Admitting: Family

## 2016-10-24 VITALS — BP 160/104 | HR 94 | Temp 97.9°F | Ht 75.0 in | Wt 334.0 lb

## 2016-10-24 DIAGNOSIS — S300XXD Contusion of lower back and pelvis, subsequent encounter: Secondary | ICD-10-CM

## 2016-10-24 DIAGNOSIS — M545 Low back pain, unspecified: Secondary | ICD-10-CM

## 2016-10-24 DIAGNOSIS — R030 Elevated blood-pressure reading, without diagnosis of hypertension: Secondary | ICD-10-CM | POA: Diagnosis not present

## 2016-10-24 DIAGNOSIS — S300XXA Contusion of lower back and pelvis, initial encounter: Secondary | ICD-10-CM

## 2016-10-24 DIAGNOSIS — W102XXS Fall (on)(from) incline, sequela: Secondary | ICD-10-CM

## 2016-10-24 MED ORDER — LISINOPRIL 20 MG PO TABS: 20.0000 mg | ORAL_TABLET | Freq: Every day | ORAL | 3 refills | Status: DC

## 2016-10-24 NOTE — Patient Instructions (Signed)
Contusion A contusion is a deep bruise. Contusions are the result of a blunt injury to tissues and muscle fibers under the skin. The injury causes bleeding under the skin. The skin overlying the contusion may turn blue, purple, or yellow. Minor injuries will give you a painless contusion, but more severe contusions may stay painful and swollen for a few weeks. What are the causes? This condition is usually caused by a blow, trauma, or direct force to an area of the body. What are the signs or symptoms? Symptoms of this condition include:  Swelling of the injured area.  Pain and tenderness in the injured area.  Discoloration. The area may have redness and then turn blue, purple, or yellow. How is this diagnosed? This condition is diagnosed based on a physical exam and medical history. An X-ray, CT scan, or MRI may be needed to determine if there are any associated injuries, such as broken bones (fractures). How is this treated? Specific treatment for this condition depends on what area of the body was injured. In general, the best treatment for a contusion is resting, icing, applying pressure to (compression), and elevating the injured area. This is often called the RICE strategy. Over-the-counter anti-inflammatory medicines may also be recommended for pain control. Follow these instructions at home:  Rest the injured area.  If directed, apply ice to the injured area:  Put ice in a plastic bag.  Place a towel between your skin and the bag.  Leave the ice on for 20 minutes, 2-3 times per day.  If directed, apply light compression to the injured area using an elastic bandage. Make sure the bandage is not wrapped too tightly. Remove and reapply the bandage as directed by your health care provider.  If possible, raise (elevate) the injured area above the level of your heart while you are sitting or lying down.  Take over-the-counter and prescription medicines only as told by your health  care provider. Contact a health care provider if:  Your symptoms do not improve after several days of treatment.  Your symptoms get worse.  You have difficulty moving the injured area. Get help right away if:  You have severe pain.  You have numbness in a hand or foot.  Your hand or foot turns pale or cold. This information is not intended to replace advice given to you by your health care provider. Make sure you discuss any questions you have with your health care provider. Document Released: 06/22/2005 Document Revised: 01/21/2016 Document Reviewed: 01/28/2015 Elsevier Interactive Patient Education  2017 Elsevier Inc.  

## 2016-10-24 NOTE — Progress Notes (Addendum)
   Subjective:    Patient ID: Bobby Ortiz, male    DOB: 1961/07/07, 56 y.o.   MRN: [id number]   HPI PT presents to the office today for Workers Comp for [employer] injury. Pt states he fell out of a truck on 10/10/16 and broke left arm, but is having a lot of pain and bruising on his left hip. PT states he has intermittent throbbing pain of 7 out 10. The pain is worse when he sits for long periods of time. Pt taking Norco 7.5-325 mg once daily to help him sleep. Pt states this seems to help.    Review of Systems  All other systems reviewed and are negative.  Social History   Social History  . Marital status: Married    Spouse name: N/A  . Number of children: N/A  . Years of education: N/A   Social History Main Topics  . Smoking status: Never Smoker  . Smokeless tobacco: Never Used  . Alcohol use No  . Drug use: No  . Sexual activity: Not Asked   Other Topics Concern  . None   Social History Narrative  . None   No family history on file.     Objective:   Physical Exam  Constitutional: He is oriented to person, place, and time. He appears well-developed and well-nourished.  Cardiovascular: Normal rate, regular rhythm, normal heart sounds and intact distal pulses.   Pulmonary/Chest: Effort normal and breath sounds normal.  Abdominal: Soft. Bowel sounds are normal.  Musculoskeletal:  Left arm in cast and sling   Neurological: He is alert and oriented to person, place, and time.  Skin: Skin is warm and dry. Ecchymosis (left lower back ) noted.  Psychiatric: He has a normal mood and affect. His behavior is normal. Judgment and thought content normal.   X-ray- Possible fracture noted in  Will wait for radiologist to read. Preliminary reading by Jannifer Rodneyhristy Flecia Shutter, FNP [facility]   BP (!) 217/121   Pulse 91   Temp 97.9 F (36.6 C) (Oral)   Ht 6\' 3"  (1.905 m)   Wt (!) 334 lb (151.5 kg)   BMI 41.75 kg/m      Assessment & Plan:  1. Acute left-sided low back pain  without sciatica - DG Lumbar Spine 2-3 Views; Future  2. Contusion of lower back, subsequent encounter  3. Fall (on)(from) incline, sequela  4. Elevated blood pressure reading -Could be related to pain? RTO in 2 weeks to recheck  Rest Ice Radiologists report pending, question on lower lumbar? If fracture noted will do ortho referral RTO in 2 weeks to recheck BP  Jannifer Rodneyhristy Serra Younan, FNP

## 2016-10-26 ENCOUNTER — Telehealth: Payer: Self-pay | Admitting: Family

## 2016-10-26 NOTE — Telephone Encounter (Signed)
Patient aware of results.

## 2016-10-26 NOTE — Telephone Encounter (Signed)
Pt notified of results Verbalizes understanding 

## 2017-01-05 ENCOUNTER — Telehealth: Payer: Self-pay

## 2017-01-05 NOTE — Telephone Encounter (Signed)
Pt needs to be seen. Has only been seen for Workers Comp Injury.

## 2017-01-06 NOTE — Telephone Encounter (Signed)
He will need to be seen. Last appt was three month ago.

## 2017-01-06 NOTE — Telephone Encounter (Signed)
Attempted to contact patient- busy x 2  

## 2017-01-10 NOTE — Telephone Encounter (Signed)
Patient has an appt to be seen on 01/11/17

## 2017-01-11 ENCOUNTER — Encounter: Payer: Self-pay | Admitting: Family

## 2017-01-11 ENCOUNTER — Other Ambulatory Visit: Payer: Self-pay

## 2017-01-11 ENCOUNTER — Ambulatory Visit (INDEPENDENT_AMBULATORY_CARE_PROVIDER_SITE_OTHER): Payer: BLUE CROSS/BLUE SHIELD | Admitting: Family

## 2017-01-11 ENCOUNTER — Encounter (INDEPENDENT_AMBULATORY_CARE_PROVIDER_SITE_OTHER): Payer: Self-pay

## 2017-01-11 VITALS — BP 131/88 | HR 78 | Temp 99.1°F | Ht 75.0 in | Wt 331.0 lb

## 2017-01-11 DIAGNOSIS — R0683 Snoring: Secondary | ICD-10-CM | POA: Diagnosis not present

## 2017-01-11 DIAGNOSIS — G4700 Insomnia, unspecified: Secondary | ICD-10-CM | POA: Diagnosis not present

## 2017-01-11 DIAGNOSIS — G4730 Sleep apnea, unspecified: Secondary | ICD-10-CM

## 2017-01-11 MED ORDER — TRAZODONE HCL 50 MG PO TABS: 25.0000 mg | ORAL_TABLET | Freq: Every evening | ORAL | 3 refills | Status: DC | PRN

## 2017-01-11 NOTE — Progress Notes (Signed)
   Subjective:    Patient ID: Bobby Ortiz, male    DOB: Dec 12, 1960, 56 y.o.   MRN: [id number]  HPI Pt presents to the office today with complaints of snoring and fatigue  would like a referral for a sleep study. PT is morbid obese.    PT states he is having trouble staying asleep at night. States he wakes up 4-5 times a night. Pt states he has taken tylenol pm and was able to sleep for 5 hours straight.  Pt states he is starting to fall asleep several times a day. States he nods off while driving or sitting still.    Review of Systems  All other systems reviewed and are negative.      Objective:   Physical Exam  Constitutional: He is oriented to person, place, and time. He appears well-developed and well-nourished. No distress.  HENT:  Head: Normocephalic.  Eyes: Pupils are equal, round, and reactive to light. Right eye exhibits no discharge. Left eye exhibits no discharge.  Neck: Normal range of motion. Neck supple. No thyromegaly present.  Cardiovascular: Normal rate, regular rhythm, normal heart sounds and intact distal pulses.   No murmur heard. Pulmonary/Chest: Effort normal and breath sounds normal. No respiratory distress. He has no wheezes.  Abdominal: Soft. Bowel sounds are normal. He exhibits no distension. There is no tenderness.  Musculoskeletal: Normal range of motion. He exhibits no edema or tenderness.  Neurological: He is alert and oriented to person, place, and time.  Skin: Skin is warm and dry. No rash noted. No erythema.  Psychiatric: He has a normal mood and affect. His behavior is normal. Judgment and thought content normal.  Vitals reviewed.     BP 131/88   Pulse 78   Temp 99.1 F (37.3 C) (Oral)   Ht  (1.905 m)   Wt (!) 331 lb (150.1 kg)   BMI 41.37 kg/m      Assessment & Plan:  1. Snoring - Ambulatory referral to Neurology  2. Insomnia, unspecified type - Ambulatory referral to Neurology - traZODone (DESYREL) 50 MG tablet; Take 0.5-1  tablets (25-50 mg total) by mouth at bedtime as needed for sleep.  Dispense: 45 tablet; Refill: 3  3. Morbid obesity (HCC) - Ambulatory referral to Neurology  Will do referral for sleep study Trazodone  rx started today Sleep ritual discussed  Jannifer Rodney, FNP

## 2017-01-11 NOTE — Telephone Encounter (Signed)
Patient was seen today and referral was done for sleep study.

## 2017-01-11 NOTE — Patient Instructions (Signed)

## 2017-01-24 ENCOUNTER — Ambulatory Visit: Payer: BLUE CROSS/BLUE SHIELD | Attending: Family | Admitting: Neurology

## 2017-01-24 DIAGNOSIS — G4733 Obstructive sleep apnea (adult) (pediatric): Secondary | ICD-10-CM | POA: Diagnosis not present

## 2017-01-24 DIAGNOSIS — Z79899 Other long term (current) drug therapy: Secondary | ICD-10-CM | POA: Insufficient documentation

## 2017-01-24 DIAGNOSIS — Z7982 Long term (current) use of aspirin: Secondary | ICD-10-CM | POA: Insufficient documentation

## 2017-01-24 DIAGNOSIS — G4730 Sleep apnea, unspecified: Secondary | ICD-10-CM | POA: Diagnosis present

## 2017-02-04 NOTE — Procedures (Signed)
[place] A. Merlene Laughter, MD     [email or web address]             NOCTURNAL POLYSOMNOGRAPHY   LOCATION: [facility]  Patient Name: Bobby Ortiz, Bobby Ortiz Date: 01/24/2017 Gender: Male D.O.B: [birth year] Age (years): 34 Referring Provider: Sharion Balloon FNP Height (inches): 72 Interpreting Physician: Phillips Odor MD, ABSM Weight (lbs): 331 RPSGT: Rosebud Poles BMI: 41 MRN: [id number] Neck Size: 20.00 CLINICAL INFORMATION Sleep Study Type: Split Night CPAP  Indication for sleep study: OSA  Epworth Sleepiness Score: 21  SLEEP STUDY TECHNIQUE As per the AASM Manual for the Scoring of Sleep and Associated Events v2.3 (April 2016) with a hypopnea requiring 4% desaturations.  The channels recorded and monitored were frontal, central and occipital EEG, electrooculogram (EOG), submentalis EMG (chin), nasal and oral airflow, thoracic and abdominal wall motion, anterior tibialis EMG, snore microphone, electrocardiogram, and pulse oximetry. Continuous positive airway pressure (CPAP) was initiated when the patient met split night criteria and was titrated according to treat sleep-disordered breathing.  MEDICATIONS Medications self-administered by patient taken the night of the study : N/A  Current Outpatient Prescriptions:  .  aspirin EC 81 MG tablet, Take 81 mg by mouth daily., Disp: , Rfl:  .  ibuprofen (ADVIL,MOTRIN) 200 MG tablet, Take 400-600 mg by mouth every 6 (six) hours as needed., Disp: , Rfl:  .  lisinopril (PRINIVIL,ZESTRIL) 20 MG tablet, Take 1 tablet (20 mg total) by mouth daily., Disp: 90 tablet, Rfl: 3 .  traZODone (DESYREL) 50 MG tablet, Take 0.5-1 tablets (25-50 mg total) by mouth at bedtime as needed for sleep., Disp: 45 tablet, Rfl: 3   RESPIRATORY PARAMETERS Diagnostic  Total AHI (/hr): 84.6 RDI (/hr): 84.6 OA Index (/hr): 40.7 CA Index (/hr): 0.0 REM AHI (/hr): N/A NREM AHI (/hr): 84.6 Supine AHI (/hr): 84.6 Non-supine AHI (/hr): N/A Min O2  Sat (%): 71.00 Mean O2 (%): 89.63 Time below 88% (min): 66.2   Titration  Optimal Pressure (cm): 15 AHI at Optimal Pressure (/hr): 0.0 Min O2 at Optimal Pressure (%): 92.0 Supine % at Optimal (%): 100 Sleep % at Optimal (%): 100   SLEEP ARCHITECTURE The recording time for the entire night was 410.4 minutes.  During a baseline period of 173.0 minutes, the patient slept for 168.0 minutes in REM and nonREM, yielding a sleep efficiency of 97.1%. Sleep onset after lights out was 0.3 minutes with a REM latency of N/A minutes. The patient spent 2.38% of the night in stage N1 sleep, 97.62% in stage N2 sleep, 0.00% in stage N3 and 0.00% in REM.  During the titration period of 230.5 minutes, the patient slept for 217.2 minutes in REM and nonREM, yielding a sleep efficiency of 94.2%. Sleep onset after CPAP initiation was 8.3 minutes with a REM latency of 32.5 minutes. The patient spent 3.22% of the night in stage N1 sleep, 24.25% in stage N2 sleep, 26.02% in stage N3 and 46.51% in REM.  CARDIAC DATA The 2 lead EKG demonstrated sinus rhythm. The mean heart rate was N/A beats per minute. Other EKG findings include: None. LEG MOVEMENT DATA The total Periodic Limb Movements of Sleep (PLMS) were 233. The PLMS index was 36.30.  IMPRESSIONS - Severe obstructive sleep apnea occurred during the diagnostic portion of the study (AHI = 84.6/hour). An optimal CPAP is selected for this patient ( 15 cm of water).   Delano Metz, MD Diplomate, American Board of Sleep Medicine.  ELECTRONICALLY SIGNED ON:  02/04/2017, 4:36 PM [place]  [facility] PH: [phone]   FX: [phone] [place]

## 2018-01-24 ENCOUNTER — Other Ambulatory Visit: Payer: Self-pay | Admitting: Family

## 2018-01-24 DIAGNOSIS — R030 Elevated blood-pressure reading, without diagnosis of hypertension: Secondary | ICD-10-CM

## 2018-02-15 ENCOUNTER — Encounter: Payer: Self-pay | Admitting: Family

## 2018-02-15 ENCOUNTER — Ambulatory Visit: Payer: BLUE CROSS/BLUE SHIELD | Admitting: Family

## 2018-02-15 VITALS — BP 181/101 | HR 75 | Temp 99.0°F | Ht 75.0 in | Wt 337.0 lb

## 2018-02-15 DIAGNOSIS — I159 Secondary hypertension, unspecified: Secondary | ICD-10-CM | POA: Diagnosis not present

## 2018-02-15 MED ORDER — LISINOPRIL-HYDROCHLOROTHIAZIDE 20-12.5 MG PO TABS: 2.0000 | ORAL_TABLET | Freq: Every day | ORAL | 0 refills | Status: DC

## 2018-02-15 NOTE — Patient Instructions (Signed)

## 2018-02-15 NOTE — Progress Notes (Signed)
   Subjective:    Patient ID: Bobby Ortiz, male    DOB: Feb 07, 1961, 57 y.o.   MRN: [id number]  Chief Complaint  Patient presents with  . discuss hypertension    Hypertension  This is a new problem. The current episode started more than 1 month ago. The problem has been gradually worsening since onset. The problem is uncontrolled. Associated symptoms include peripheral edema (at times). Pertinent negatives include no headaches, malaise/fatigue or shortness of breath. Risk factors for coronary artery disease include obesity and male gender. Past treatments include nothing. The current treatment provides no improvement. There is no history of kidney disease, CAD/MI, CVA or heart failure.      Review of Systems  Constitutional: Negative for malaise/fatigue.  Respiratory: Negative for shortness of breath.   Neurological: Negative for headaches.  All other systems reviewed and are negative.      Objective:   Physical Exam  Constitutional: He is oriented to person, place, and time. He appears well-developed and well-nourished. No distress.  HENT:  Head: Normocephalic.  Right Ear: External ear normal.  Left Ear: External ear normal.  Mouth/Throat: Oropharynx is clear and moist.  Eyes: Pupils are equal, round, and reactive to light. Right eye exhibits no discharge. Left eye exhibits no discharge.  Neck: Normal range of motion. Neck supple. No thyromegaly present.  Cardiovascular: Normal rate, regular rhythm, normal heart sounds and intact distal pulses.  No murmur heard. Pulmonary/Chest: Effort normal and breath sounds normal. No respiratory distress. He has no wheezes.  Abdominal: Soft. Bowel sounds are normal. He exhibits no distension. There is no tenderness.  Musculoskeletal: Normal range of motion. He exhibits no edema or tenderness.  Neurological: He is alert and oriented to person, place, and time. He has normal reflexes. No cranial nerve deficit.  Skin: Skin is warm and dry.  No rash noted. No erythema.  Psychiatric: He has a normal mood and affect. His behavior is normal. Judgment and thought content normal.  Vitals reviewed.    BP (!) 181/101   Pulse 75   Temp 99 F (37.2 C) (Oral)   Ht _0  (1.905 m)   Wt (!) 337 lb (152.9 kg)   BMI 42.12 kg/m      Assessment & Plan:  Bobby Ortiz comes in today with chief complaint of discuss hypertension   Diagnosis and orders addressed:  1. Secondary hypertension Will increase lisinopril to 40 mg from 20 mg and add HCTZ 25 mg today -Dash diet information given -Exercise encouraged - Stress Management  -Continue current meds -RTO in 2 weeks  - lisinopril-hydrochlorothiazide (ZESTORETIC) 20-12.5 MG tablet; Take 2 tablets by mouth daily.  Dispense: 180 tablet; Refill: 0 - BMP8+EGFR  2. Morbid obesity (HCC) - lisinopril-hydrochlorothiazide (ZESTORETIC) 20-12.5 MG tablet; Take 2 tablets by mouth daily.  Dispense: 180 tablet; Refill: 0 - BMP8+EGFR   Labs pending Health Maintenance reviewed Diet and exercise encouraged  Follow up plan: 2 weeks for CPE and HTN  Evelina Dun, FNP

## 2018-02-16 ENCOUNTER — Ambulatory Visit: Payer: Self-pay | Admitting: Family

## 2018-02-16 LAB — BMP8+EGFR
BUN/Creatinine Ratio: 17 (ref 9–20)
BUN: 19 mg/dL (ref 6–24)
CO2: 23 mmol/L (ref 20–29)
Calcium: 9.6 mg/dL (ref 8.7–10.2)
Chloride: 102 mmol/L (ref 96–106)
Creatinine, Ser: 1.1 mg/dL (ref 0.76–1.27)
GFR calc Af Amer: 86 mL/min/{1.73_m2} (ref >59)
GFR calc non Af Amer: 75 mL/min/{1.73_m2} (ref >59)
Glucose: 121 mg/dL — ABNORMAL HIGH (ref 65–99)
Potassium: 4.4 mmol/L (ref 3.5–5.2)
Sodium: 139 mmol/L (ref 134–144)

## 2018-03-06 ENCOUNTER — Encounter: Payer: Self-pay | Admitting: Family

## 2018-03-06 ENCOUNTER — Ambulatory Visit: Payer: BLUE CROSS/BLUE SHIELD | Admitting: Family

## 2018-03-06 VITALS — BP 134/83 | HR 74 | Temp 98.8°F | Ht 75.0 in | Wt 333.0 lb

## 2018-03-06 DIAGNOSIS — Z0000 Encounter for general adult medical examination without abnormal findings: Secondary | ICD-10-CM

## 2018-03-06 DIAGNOSIS — I159 Secondary hypertension, unspecified: Secondary | ICD-10-CM

## 2018-03-06 DIAGNOSIS — Z1159 Encounter for screening for other viral diseases: Secondary | ICD-10-CM

## 2018-03-06 MED ORDER — AMLODIPINE BESYLATE 5 MG PO TABS: 5.0000 mg | ORAL_TABLET | Freq: Every day | ORAL | 3 refills | Status: DC

## 2018-03-06 NOTE — Progress Notes (Signed)
   Subjective:    Patient ID: Bobby Ortiz, male    DOB: [birth year], 57 y.o.   MRN: [id number]  Chief Complaint  Patient presents with  . Hypertension    two week recheck   Pt presents to the office today for CPE and recheck HTN.  Hypertension  This is a new problem. The current episode started in the past 7 days. The problem has been waxing and waning since onset. The problem is uncontrolled. Pertinent negatives include no headaches, malaise/fatigue, peripheral edema or shortness of breath. Risk factors for coronary artery disease include family history. Past treatments include ACE inhibitors and diuretics. The current treatment provides mild improvement. There is no history of kidney disease, CAD/MI, CVA or heart failure.      Review of Systems  Constitutional: Negative for malaise/fatigue.  Respiratory: Negative for shortness of breath.   Neurological: Negative for headaches.       Objective:   Physical Exam  Constitutional: He is oriented to person, place, and time. He appears well-developed and well-nourished. No distress.  HENT:  Head: Normocephalic.  Right Ear: External ear normal.  Left Ear: External ear normal.  Mouth/Throat: Oropharynx is clear and moist.  Eyes: Pupils are equal, round, and reactive to light. Right eye exhibits no discharge. Left eye exhibits no discharge.  Neck: Normal range of motion. Neck supple. No thyromegaly present.  Cardiovascular: Normal rate, regular rhythm, normal heart sounds and intact distal pulses.  No murmur heard. Pulmonary/Chest: Effort normal and breath sounds normal. No respiratory distress. He has no wheezes.  Abdominal: Soft. Bowel sounds are normal. He exhibits no distension. There is no tenderness. A hernia is present.  Musculoskeletal: Normal range of motion. He exhibits tenderness (trace BLE). He exhibits no edema.  Neurological: He is alert and oriented to person, place, and time. He has normal reflexes. No cranial nerve  deficit.  Skin: Skin is warm and dry. No rash noted. No erythema.  Psychiatric: He has a normal mood and affect. His behavior is normal. Judgment and thought content normal.  Vitals reviewed.     BP 134/83   Pulse 74   Temp 98.8 F (37.1 C) (Oral)   Ht '6\' 3"'$  (1.905 m)   Wt (!) 333 lb (151 kg)   BMI 41.62 kg/m      Assessment & Plan:  [name] comes in today with chief complaint of Hypertension (two week recheck)   Diagnosis and orders addressed:  1. Annual physical exam - CMP14+EGFR - CBC with Differential/Platelet - Lipid panel - PSA, total and free - TSH  2. Secondary hypertension - CMP14+EGFR - CBC with Differential/Platelet  3. Morbid obesity ([place]) - CMP14+EGFR - CBC with Differential/Platelet  4. Need for hepatitis C screening test - Hepatitis C antibody   Labs pending Health Maintenance reviewed- Pt states he is planning on getting his colonoscopy, but wants to wait after August. Told to call the office and we would do referral.  Diet and exercise encouraged  Follow up plan: 1 year   Evelina Dun, FNP

## 2018-03-06 NOTE — Patient Instructions (Signed)

## 2018-03-07 LAB — CMP14+EGFR
ALT: 23 IU/L (ref 0–44)
AST: 20 IU/L (ref 0–40)
Albumin/Globulin Ratio: 1.6 (ref 1.2–2.2)
Albumin: 4.5 g/dL (ref 3.5–5.5)
Alkaline Phosphatase: 70 IU/L (ref 39–117)
BUN/Creatinine Ratio: 15 (ref 9–20)
BUN: 21 mg/dL (ref 6–24)
Bilirubin Total: 0.3 mg/dL (ref 0.0–1.2)
CO2: 25 mmol/L (ref 20–29)
Calcium: 9.8 mg/dL (ref 8.7–10.2)
Chloride: 98 mmol/L (ref 96–106)
Creatinine, Ser: 1.38 mg/dL — ABNORMAL HIGH (ref 0.76–1.27)
GFR calc Af Amer: 66 mL/min/{1.73_m2} (ref >59)
GFR calc non Af Amer: 57 mL/min/{1.73_m2} — ABNORMAL LOW (ref >59)
Globulin, Total: 2.8 g/dL (ref 1.5–4.5)
Glucose: 100 mg/dL — ABNORMAL HIGH (ref 65–99)
Potassium: 4.6 mmol/L (ref 3.5–5.2)
Sodium: 138 mmol/L (ref 134–144)
Total Protein: 7.3 g/dL (ref 6.0–8.5)

## 2018-03-07 LAB — TSH: TSH: 5.71 u[IU]/mL — ABNORMAL HIGH (ref 0.450–4.500)

## 2018-03-07 LAB — CBC WITH DIFFERENTIAL/PLATELET
Basophils Absolute: 0.1 10*3/uL (ref 0.0–0.2)
Basos: 1 %
EOS (ABSOLUTE): 0.1 10*3/uL (ref 0.0–0.4)
Eos: 1 %
Hematocrit: 42.4 % (ref 37.5–51.0)
Hemoglobin: 14.7 g/dL (ref 13.0–17.7)
Immature Grans (Abs): 0 10*3/uL (ref 0.0–0.1)
Immature Granulocytes: 0 %
Lymphocytes Absolute: 2 10*3/uL (ref 0.7–3.1)
Lymphs: 42 %
MCH: 30.7 pg (ref 26.6–33.0)
MCHC: 34.7 g/dL (ref 31.5–35.7)
MCV: 89 fL (ref 79–97)
Monocytes Absolute: 0.3 10*3/uL (ref 0.1–0.9)
Monocytes: 6 %
Neutrophils Absolute: 2.4 10*3/uL (ref 1.4–7.0)
Neutrophils: 50 %
Platelets: 255 10*3/uL (ref 150–450)
RBC: 4.79 x10E6/uL (ref 4.14–5.80)
RDW: 14.2 % (ref 12.3–15.4)
WBC: 4.8 10*3/uL (ref 3.4–10.8)

## 2018-03-07 LAB — LIPID PANEL
Chol/HDL Ratio: 4.7 ratio (ref 0.0–5.0)
Cholesterol, Total: 175 mg/dL (ref 100–199)
HDL: 37 mg/dL — ABNORMAL LOW (ref >39)
LDL Calculated: 111 mg/dL — ABNORMAL HIGH (ref 0–99)
Triglycerides: 134 mg/dL (ref 0–149)
VLDL Cholesterol Cal: 27 mg/dL (ref 5–40)

## 2018-03-07 LAB — PSA, TOTAL AND FREE
PSA, Free Pct: 20.6 %
PSA, Free: 0.33 ng/mL
Prostate Specific Ag, Serum: 1.6 ng/mL (ref 0.0–4.0)

## 2018-03-07 LAB — HEPATITIS C ANTIBODY: Hep C Virus Ab: 0.1 s/co ratio (ref 0.0–0.9)

## 2018-05-03 ENCOUNTER — Other Ambulatory Visit: Payer: Self-pay | Admitting: *Deleted

## 2018-05-03 DIAGNOSIS — R899 Unspecified abnormal finding in specimens from other organs, systems and tissues: Secondary | ICD-10-CM

## 2018-05-17 ENCOUNTER — Ambulatory Visit: Payer: Self-pay | Admitting: Family

## 2018-05-21 ENCOUNTER — Other Ambulatory Visit: Payer: Self-pay | Admitting: Family

## 2018-05-21 DIAGNOSIS — I159 Secondary hypertension, unspecified: Secondary | ICD-10-CM

## 2018-11-16 ENCOUNTER — Other Ambulatory Visit: Payer: Self-pay | Admitting: Family

## 2018-11-16 DIAGNOSIS — I159 Secondary hypertension, unspecified: Secondary | ICD-10-CM

## 2018-11-16 NOTE — Telephone Encounter (Signed)
Last seen 03/06/18

## 2019-02-01 ENCOUNTER — Telehealth: Payer: Self-pay | Admitting: Family

## 2019-02-15 ENCOUNTER — Other Ambulatory Visit: Payer: Self-pay | Admitting: Family

## 2019-02-15 DIAGNOSIS — I159 Secondary hypertension, unspecified: Secondary | ICD-10-CM

## 2019-02-15 NOTE — Telephone Encounter (Signed)
NTBS.

## 2019-05-13 ENCOUNTER — Other Ambulatory Visit: Payer: Self-pay | Admitting: Family

## 2019-05-13 DIAGNOSIS — I159 Secondary hypertension, unspecified: Secondary | ICD-10-CM

## 2019-08-13 ENCOUNTER — Other Ambulatory Visit: Payer: Self-pay | Admitting: Family

## 2019-08-13 DIAGNOSIS — I159 Secondary hypertension, unspecified: Secondary | ICD-10-CM

## 2019-08-13 NOTE — Telephone Encounter (Signed)
What is the name of the medication? BP Medication  Have you contacted your pharmacy to request a refill? Yes Which pharmacy would you like this sent to? [facility], Scheduled pt for OV on 09/02/19. Wants enough to last him until his appt   Patient notified that their request is being sent to the clinical staff for review and that they should receive a call once it is complete. If they do not receive a call within 24 hours they can check with their pharmacy or our office.

## 2019-08-26 ENCOUNTER — Other Ambulatory Visit: Payer: Self-pay

## 2019-08-26 DIAGNOSIS — Z20822 Contact with and (suspected) exposure to covid-19: Secondary | ICD-10-CM

## 2019-08-27 LAB — NOVEL CORONAVIRUS, NAA: SARS-CoV-2, NAA: DETECTED — AB

## 2019-08-28 ENCOUNTER — Telehealth: Payer: Self-pay | Admitting: Unknown Physician Specialty

## 2019-08-28 NOTE — Telephone Encounter (Signed)
Discussed with patient about Covid symptoms and the use of bamlanivimab, a monoclonal antibody infusion for those with mild to moderate Covid symptoms and at a high risk of hospitalization.    Pt is qualified for this infusion at the [facility] infusion center due to hypertension treated by PCP.    Pt is asymptomatic and not interested

## 2019-08-29 ENCOUNTER — Telehealth: Payer: Self-pay | Admitting: Family

## 2019-08-29 NOTE — Telephone Encounter (Signed)
Spoke to pt and advised him to get the fax number and call back with it and we will fax the results over. Pt voiced understanding.

## 2019-09-02 ENCOUNTER — Ambulatory Visit (INDEPENDENT_AMBULATORY_CARE_PROVIDER_SITE_OTHER): Payer: Commercial Managed Care - PPO | Admitting: Family

## 2019-09-02 ENCOUNTER — Encounter: Payer: Self-pay | Admitting: Family

## 2019-09-02 ENCOUNTER — Other Ambulatory Visit: Payer: Self-pay

## 2019-09-02 DIAGNOSIS — Z20822 Contact with and (suspected) exposure to covid-19: Secondary | ICD-10-CM

## 2019-09-02 DIAGNOSIS — K429 Umbilical hernia without obstruction or gangrene: Secondary | ICD-10-CM

## 2019-09-02 DIAGNOSIS — I159 Secondary hypertension, unspecified: Secondary | ICD-10-CM

## 2019-09-02 MED ORDER — LISINOPRIL-HYDROCHLOROTHIAZIDE 20-12.5 MG PO TABS: 2.0000 | ORAL_TABLET | Freq: Every day | ORAL | 2 refills | Status: DC

## 2019-09-02 NOTE — Progress Notes (Signed)
Virtual Visit via telephone Note Due to COVID-19 pandemic this visit was conducted virtually. This visit type was conducted due to national recommendations for restrictions regarding the COVID-19 Pandemic (e.g. social distancing, sheltering in place) in an effort to limit this patient's exposure and mitigate transmission in our community. All issues noted in this document were discussed and addressed.  A physical exam was not performed with this format.  I connected with Bobby Ortiz on 09/02/19 at 2:07 pm by telephone and verified that I am speaking with the correct person using two identifiers. Bobby Ortiz is currently located at home and wife is currently with him  during visit. The provider, Jannifer Rodney, FNP is located in their office at time of visit.  I discussed the limitations, risks, security and privacy concerns of performing an evaluation and management service by telephone and the availability of in person appointments. I also discussed with the patient that there may be a patient responsible charge related to this service. The patient expressed understanding and agreed to proceed.   History and Present Illness:  Pt calls the office today for refill of BP medication. He was diagnosed with COVID last week. He is feeling well with no complaints of SOB, cough, or fevers at this time.   He is complaining of a hernia around his umbilicus that he noticed last year. He denies any pain while sitting and states he has mild pain with pushing and straining   Hypertension This is a chronic problem. The current episode started more than 1 year ago. The problem has been resolved since onset. The problem is controlled. Pertinent negatives include no headaches, malaise/fatigue, peripheral edema or shortness of breath. The current treatment provides moderate improvement. There is no history of kidney disease, CAD/MI or heart failure.       Review of Systems  Constitutional: Negative for  malaise/fatigue.  Respiratory: Negative for shortness of breath.   Neurological: Negative for headaches.     Observations/Objective: No SOB or distress noted   Assessment and Plan: Bobby Ortiz comes in today with chief complaint of No chief complaint on file.   Diagnosis and orders addressed:  1. Secondary hypertension -Dash diet information given -Exercise encouraged - Stress Management  -Continue current meds -RTO in 6 months  - lisinopril-hydrochlorothiazide (ZESTORETIC) 20-12.5 MG tablet; Take 2 tablets by mouth daily.  Dispense: 90 tablet; Refill: 2  2. Morbid obesity (HCC) - lisinopril-hydrochlorothiazide (ZESTORETIC) 20-12.5 MG tablet; Take 2 tablets by mouth daily.  Dispense: 90 tablet; Refill: 2  3. Umbilical hernia without obstruction and without gangrene - Ambulatory referral to General Surgery   Labs pending- Will come in January to have drawn  Health Maintenance reviewed Diet and exercise encouraged  Follow up plan: 1 year       I discussed the assessment and treatment plan with the patient. The patient was provided an opportunity to ask questions and all were answered. The patient agreed with the plan and demonstrated an understanding of the instructions.   The patient was advised to call back or seek an in-person evaluation if the symptoms worsen or if the condition fails to improve as anticipated.  The above assessment and management plan was discussed with the patient. The patient verbalized understanding of and has agreed to the management plan. Patient is aware to call the clinic if symptoms persist or worsen. Patient is aware when to return to the clinic for a follow-up visit. Patient educated on when it is appropriate to go to  the emergency department.   Time call ended:  2:22 pm   I provided 15 minutes of non-face-to-face time during this encounter.    Evelina Dun, FNP

## 2019-09-03 LAB — NOVEL CORONAVIRUS, NAA: SARS-CoV-2, NAA: DETECTED — AB

## 2019-09-03 NOTE — Telephone Encounter (Signed)
Virtual visit done yesterday.

## 2019-10-07 ENCOUNTER — Telehealth: Payer: Self-pay | Admitting: Family

## 2019-10-07 NOTE — Telephone Encounter (Signed)
Patient has a follow up appointment scheduled. 

## 2019-10-14 ENCOUNTER — Encounter: Payer: Self-pay | Admitting: Family

## 2019-10-14 ENCOUNTER — Ambulatory Visit: Payer: Commercial Managed Care - PPO | Admitting: Family

## 2019-10-14 ENCOUNTER — Other Ambulatory Visit: Payer: Self-pay

## 2019-10-14 VITALS — BP 126/88 | HR 79 | Temp 99.1°F | Ht 75.0 in | Wt 346.0 lb

## 2019-10-14 DIAGNOSIS — I159 Secondary hypertension, unspecified: Secondary | ICD-10-CM | POA: Diagnosis not present

## 2019-10-14 DIAGNOSIS — Z0000 Encounter for general adult medical examination without abnormal findings: Secondary | ICD-10-CM | POA: Diagnosis not present

## 2019-10-14 MED ORDER — LISINOPRIL-HYDROCHLOROTHIAZIDE 20-12.5 MG PO TABS: 2.0000 | ORAL_TABLET | Freq: Every day | ORAL | 2 refills | Status: DC

## 2019-10-14 NOTE — Patient Instructions (Signed)

## 2019-10-14 NOTE — Progress Notes (Signed)
Subjective:    Patient ID: Bobby Ortiz, male    DOB: November 13, 1960, 59 y.o.   MRN: [id number]  Chief Complaint  Patient presents with  . Medical Management of Chronic Issues   PT presents to the office today for CPE.  Hypertension This is a chronic problem. The current episode started more than 1 year ago. The problem has been resolved since onset. The problem is controlled. Associated symptoms include malaise/fatigue. Pertinent negatives include no anxiety, peripheral edema or shortness of breath. Risk factors for coronary artery disease include dyslipidemia, obesity, male gender and sedentary lifestyle. The current treatment provides moderate improvement. There is no history of kidney disease, CAD/MI, CVA or heart failure.      Review of Systems  Constitutional: Positive for malaise/fatigue.  Respiratory: Negative for shortness of breath.   All other systems reviewed and are negative.  Family History  Problem Relation Age of Onset  . Heart disease Mother        Stents  . Cancer Mother        Stomach  . Heart disease Father        Heart attack    Social History   Socioeconomic History  . Marital status: Married    Spouse name: Not on file  . Number of children: Not on file  . Years of education: Not on file  . Highest education level: Not on file  Occupational History  . Not on file  Tobacco Use  . Smoking status: Never Smoker  . Smokeless tobacco: Never Used  Substance and Sexual Activity  . Alcohol use: No  . Drug use: No  . Sexual activity: Not on file  Other Topics Concern  . Not on file  Social History Narrative  . Not on file   Social Determinants of Health   Financial Resource Strain:   . Difficulty of Paying Living Expenses: Not on file  Food Insecurity:   . Worried About Charity fundraiser in the Last Year: Not on file  . Ran Out of Food in the Last Year: Not on file  Transportation Needs:   . Lack of Transportation (Medical): Not on file  .  Lack of Transportation (Non-Medical): Not on file  Physical Activity:   . Days of Exercise per Week: Not on file  . Minutes of Exercise per Session: Not on file  Stress:   . Feeling of Stress : Not on file  Social Connections:   . Frequency of Communication with Friends and Family: Not on file  . Frequency of Social Gatherings with Friends and Family: Not on file  . Attends Religious Services: Not on file  . Active Member of Clubs or Organizations: Not on file  . Attends Archivist Meetings: Not on file  . Marital Status: Not on file        Objective:   Physical Exam Vitals reviewed.  Constitutional:      General: He is not in acute distress.    Appearance: He is well-developed.  HENT:     Head: Normocephalic.     Right Ear: Tympanic membrane normal.     Left Ear: Tympanic membrane normal.  Eyes:     General:        Right eye: No discharge.        Left eye: No discharge.     Pupils: Pupils are equal, round, and reactive to light.  Neck:     Thyroid: No thyromegaly.  Cardiovascular:  Rate and Rhythm: Normal rate and regular rhythm.     Heart sounds: Normal heart sounds. No murmur.  Pulmonary:     Effort: Pulmonary effort is normal. No respiratory distress.     Breath sounds: Normal breath sounds. No wheezing.  Abdominal:     General: Bowel sounds are normal. There is no distension.     Palpations: Abdomen is soft.     Tenderness: There is no abdominal tenderness.     Hernia: A hernia is present.  Musculoskeletal:        General: No tenderness. Normal range of motion.     Cervical back: Normal range of motion and neck supple.  Skin:    General: Skin is warm and dry.     Findings: No erythema or rash.  Neurological:     Mental Status: He is alert and oriented to person, place, and time.     Cranial Nerves: No cranial nerve deficit.     Deep Tendon Reflexes: Reflexes are normal and symmetric.  Psychiatric:        Behavior: Behavior normal.         Thought Content: Thought content normal.        Judgment: Judgment normal.       BP 126/88   Pulse 79   Temp 99.1 F (37.3 C) (Temporal)   Ht '6\' 3"'  (1.905 m)   Wt (!) 346 lb (156.9 kg)   SpO2 98%   BMI 43.25 kg/m      Assessment & Plan:  Bobby Ortiz comes in today with chief complaint of Medical Management of Chronic Issues   Diagnosis and orders addressed:  1. Annual physical exam - CMP14+EGFR - CBC with Differential/Platelet - Lipid panel - PSA, total and free - TSH  2. Secondary hypertension - lisinopril-hydrochlorothiazide (ZESTORETIC) 20-12.5 MG tablet; Take 2 tablets by mouth daily.  Dispense: 90 tablet; Refill: 2  3. Morbid obesity (HCC) - lisinopril-hydrochlorothiazide (ZESTORETIC) 20-12.5 MG tablet; Take 2 tablets by mouth daily.  Dispense: 90 tablet; Refill: 2    Labs pending Health Maintenance reviewed Diet and exercise encouraged  Follow up plan:  1 year  Evelina Dun, FNP

## 2019-10-15 ENCOUNTER — Other Ambulatory Visit: Payer: Self-pay | Admitting: Family

## 2019-10-15 DIAGNOSIS — R7989 Other specified abnormal findings of blood chemistry: Secondary | ICD-10-CM

## 2019-10-15 LAB — CMP14+EGFR
ALT: 19 IU/L (ref 0–44)
AST: 20 IU/L (ref 0–40)
Albumin/Globulin Ratio: 1.5 (ref 1.2–2.2)
Albumin: 4.6 g/dL (ref 3.8–4.9)
Alkaline Phosphatase: 78 IU/L (ref 39–117)
BUN/Creatinine Ratio: 16 (ref 9–20)
BUN: 25 mg/dL — ABNORMAL HIGH (ref 6–24)
Bilirubin Total: <0.2 mg/dL (ref 0.0–1.2)
CO2: 24 mmol/L (ref 20–29)
Calcium: 9.6 mg/dL (ref 8.7–10.2)
Chloride: 100 mmol/L (ref 96–106)
Creatinine, Ser: 1.54 mg/dL — ABNORMAL HIGH (ref 0.76–1.27)
GFR calc Af Amer: 57 mL/min/{1.73_m2} — ABNORMAL LOW (ref >59)
GFR calc non Af Amer: 49 mL/min/{1.73_m2} — ABNORMAL LOW (ref >59)
Globulin, Total: 3 g/dL (ref 1.5–4.5)
Glucose: 125 mg/dL — ABNORMAL HIGH (ref 65–99)
Potassium: 4.2 mmol/L (ref 3.5–5.2)
Sodium: 138 mmol/L (ref 134–144)
Total Protein: 7.6 g/dL (ref 6.0–8.5)

## 2019-10-15 LAB — CBC WITH DIFFERENTIAL/PLATELET
Basophils Absolute: 0.1 10*3/uL (ref 0.0–0.2)
Basos: 1 %
EOS (ABSOLUTE): 0.1 10*3/uL (ref 0.0–0.4)
Eos: 1 %
Hematocrit: 40.1 % (ref 37.5–51.0)
Hemoglobin: 14.4 g/dL (ref 13.0–17.7)
Immature Grans (Abs): 0 10*3/uL (ref 0.0–0.1)
Immature Granulocytes: 0 %
Lymphocytes Absolute: 2.7 10*3/uL (ref 0.7–3.1)
Lymphs: 36 %
MCH: 31.9 pg (ref 26.6–33.0)
MCHC: 35.9 g/dL — ABNORMAL HIGH (ref 31.5–35.7)
MCV: 89 fL (ref 79–97)
Monocytes Absolute: 0.5 10*3/uL (ref 0.1–0.9)
Monocytes: 6 %
Neutrophils Absolute: 4.2 10*3/uL (ref 1.4–7.0)
Neutrophils: 56 %
Platelets: 255 10*3/uL (ref 150–450)
RBC: 4.51 x10E6/uL (ref 4.14–5.80)
RDW: 13.3 % (ref 11.6–15.4)
WBC: 7.6 10*3/uL (ref 3.4–10.8)

## 2019-10-15 LAB — LIPID PANEL
Chol/HDL Ratio: 5.8 ratio — ABNORMAL HIGH (ref 0.0–5.0)
Cholesterol, Total: 203 mg/dL — ABNORMAL HIGH (ref 100–199)
HDL: 35 mg/dL — ABNORMAL LOW (ref >39)
LDL Chol Calc (NIH): 127 mg/dL — ABNORMAL HIGH (ref 0–99)
Triglycerides: 232 mg/dL — ABNORMAL HIGH (ref 0–149)
VLDL Cholesterol Cal: 41 mg/dL — ABNORMAL HIGH (ref 5–40)

## 2019-10-15 LAB — PSA, TOTAL AND FREE
PSA, Free Pct: 22.2 %
PSA, Free: 0.4 ng/mL
Prostate Specific Ag, Serum: 1.8 ng/mL (ref 0.0–4.0)

## 2019-10-15 LAB — TSH: TSH: 4.23 u[IU]/mL (ref 0.450–4.500)

## 2019-10-15 MED ORDER — ATORVASTATIN CALCIUM 20 MG PO TABS: 20.0000 mg | ORAL_TABLET | Freq: Every day | ORAL | 3 refills | Status: DC

## 2019-10-15 NOTE — Telephone Encounter (Signed)
Reviewed results with pt .

## 2019-10-15 NOTE — Telephone Encounter (Signed)
Pt rc for lab results. Pt can answer after 3:00 he is at work now.

## 2019-11-12 ENCOUNTER — Ambulatory Visit: Payer: Self-pay | Admitting: Surgery

## 2019-11-12 NOTE — H&P (Signed)
History of Present Illness Bobby Ortiz. [name] MD; 11/12/2019 5:09 PM) The patient is a 59 year old male who presents with an umbilical hernia. Referred by Jannifer Rodney, NP for umbilical hernia  This is a 58 year with morbid obesity who presents with a 1-year history of an enlarging tender umbilical hernia. His job requires some lifting and this is interfering with work. No obstructive symptoms. It remains reducible. He is referred to [place] for evaluation for repair.  He is a non-smoker.   Problem List/Past Medical [name], MD; 11/12/2019 5:09 PM) UMBILICAL HERNIA WITHOUT OBSTRUCTION OR GANGRENE (K42.9)  Past Surgical History (Tanisha A. Manson Passey, RMA; 11/12/2019 3:11 PM) Appendectomy Shoulder Surgery Left. Vasectomy  Diagnostic Studies History (Tanisha A. Manson Passey, RMA; 11/12/2019 3:11 PM) Colonoscopy never  Allergies (Tanisha A. Manson Passey, RMA; 11/12/2019 3:11 PM) No Known Drug Allergies [11/12/2019]: Allergies Reconciled  Medication History (Tanisha A. Manson Passey, RMA; 11/12/2019 3:12 PM) Lisinopril-hydroCHLOROthiazide (20-12.5MG  Tablet, Oral) Active. Medications Reconciled  Social History (Tanisha A. Manson Passey, RMA; 11/12/2019 3:11 PM) Alcohol use Occasional alcohol use. Caffeine use Carbonated beverages. No drug use Tobacco use Never smoker.  Family History (Tanisha A. Manson Passey, RMA; 11/12/2019 3:11 PM) Hypertension Mother.  Other Problems Bobby Ortiz. [name], MD; 11/12/2019 5:09 PM) High blood pressure     Review of Systems ([name] RMA; 11/12/2019 3:11 PM) General Not Present- Appetite Loss, Chills, Fatigue, Fever, Night Sweats, Weight Gain and Weight Loss. Skin Not Present- Change in Wart/Mole, Dryness, Hives, Jaundice, New Lesions, Non-Healing Wounds, Rash and Ulcer. HEENT Not Present- Earache, Hearing Loss, Hoarseness, Nose Bleed, Oral Ulcers, Ringing in the Ears, Seasonal Allergies, Sinus Pain, Sore Throat, Visual Disturbances, Wears glasses/contact lenses  and Yellow Eyes. Respiratory Not Present- Bloody sputum, Chronic Cough, Difficulty Breathing, Snoring and Wheezing. Breast Not Present- Breast Mass, Breast Pain, Nipple Discharge and Skin Changes. Cardiovascular Not Present- Chest Pain, Difficulty Breathing Lying Down, Leg Cramps, Palpitations, Rapid Heart Rate, Shortness of Breath and Swelling of Extremities. Gastrointestinal Not Present- Abdominal Pain, Bloating, Bloody Stool, Change in Bowel Habits, Chronic diarrhea, Constipation, Difficulty Swallowing, Excessive gas, Gets full quickly at meals, Hemorrhoids, Indigestion, Nausea, Rectal Pain and Vomiting. Male Genitourinary Not Present- Blood in Urine, Change in Urinary Stream, Frequency, Impotence, Nocturia, Painful Urination, Urgency and Urine Leakage. Musculoskeletal Not Present- Back Pain, Joint Pain, Joint Stiffness, Muscle Pain, Muscle Weakness and Swelling of Extremities. Neurological Not Present- Decreased Memory, Fainting, Headaches, Numbness, Seizures, Tingling, Tremor, Trouble walking and Weakness. Psychiatric Not Present- Anxiety, Bipolar, Change in Sleep Pattern, Depression, Fearful and Frequent crying. Endocrine Not Present- Cold Intolerance, Excessive Hunger, Hair Changes, Heat Intolerance, Hot flashes and New Diabetes. Hematology Not Present- Blood Thinners, Easy Bruising, Excessive bleeding, Gland problems, HIV and Persistent Infections.  Vitals ([name] RMA; 11/12/2019 3:12 PM) 11/12/2019 3:12 PM Weight: 345 lb Height: 75in Body Surface Area: 2.77 m Body Mass Index: 43.12 kg/m  Temp.: 80F  Pulse: 87 (Regular)  BP: 134/82 (Sitting, Left Arm, Standard)        Physical Exam [name] MD; 11/12/2019 5:10 PM)  The physical exam findings are as follows: Note:Constitutional: WDWN in NAD, conversant, no obvious deformities; lying in bed comfortably Eyes: Pupils equal, round; sclera anicteric; moist conjunctiva; no lid lag HENT: Oral mucosa  moist; good dentition Neck: No masses palpated, trachea midline; no thyromegaly Lungs: CTA bilaterally; normal respiratory effort CV: Regular rate and rhythm; no murmurs; extremities well-perfused with no edema Abd: +bowel sounds, obese, non-tender, no palpable organomegaly; protruding umbilical hernia Hernia is  mostly reducible when he is supine. Defect about 2 cm across. Musc: Unable to assess gait; no apparent clubbing or cyanosis in extremities Lymphatic: No palpable cervical or axillary lymphadenopathy Skin: Warm, dry; no sign of jaundice Psychiatric - alert and oriented x 4; calm mood and affect    Assessment & Plan [name] MD; 9/77/4142 3:95 PM)  UMBILICAL HERNIA WITHOUT OBSTRUCTION OR GANGRENE (K42.9)  Current Plans Schedule for Surgery - Umbilical hernia repair with mesh. The surgical procedure has been discussed with the patient. Potential risks, benefits, alternative treatments, and expected outcomes have been explained. All of the patient's questions at this time have been answered. The likelihood of reaching the patient's treatment goal is good. The patient understand the proposed surgical procedure and wishes to proceed.  [name], MD, [facility] Surgery  General/ Trauma Surgery   11/12/2019 5:11 PM

## 2020-04-14 ENCOUNTER — Other Ambulatory Visit: Payer: Self-pay | Admitting: Family

## 2020-04-14 DIAGNOSIS — I159 Secondary hypertension, unspecified: Secondary | ICD-10-CM

## 2020-05-30 ENCOUNTER — Other Ambulatory Visit: Payer: Self-pay | Admitting: Family

## 2020-05-30 DIAGNOSIS — I159 Secondary hypertension, unspecified: Secondary | ICD-10-CM

## 2020-06-02 MED ORDER — LISINOPRIL-HYDROCHLOROTHIAZIDE 20-12.5 MG PO TABS: 2.0000 | ORAL_TABLET | Freq: Every day | ORAL | 0 refills | Status: DC

## 2020-06-02 NOTE — Addendum Note (Signed)
Addended by: Ignacia Bayley on: 06/02/2020 09:32 AM   Modules accepted: Orders

## 2020-06-02 NOTE — Telephone Encounter (Signed)
[place]. NTBS 30 days given 04/15/20

## 2020-06-13 ENCOUNTER — Other Ambulatory Visit: Payer: Self-pay | Admitting: Family

## 2020-06-13 DIAGNOSIS — I159 Secondary hypertension, unspecified: Secondary | ICD-10-CM

## 2020-07-07 ENCOUNTER — Encounter: Payer: Self-pay | Admitting: Family

## 2020-07-07 ENCOUNTER — Ambulatory Visit: Payer: Commercial Managed Care - PPO | Admitting: Family

## 2020-07-07 ENCOUNTER — Other Ambulatory Visit: Payer: Self-pay

## 2020-07-07 VITALS — BP 128/86 | HR 76 | Temp 97.2°F | Ht 75.0 in | Wt 340.2 lb

## 2020-07-07 DIAGNOSIS — I159 Secondary hypertension, unspecified: Secondary | ICD-10-CM

## 2020-07-07 DIAGNOSIS — Z1211 Encounter for screening for malignant neoplasm of colon: Secondary | ICD-10-CM

## 2020-07-07 DIAGNOSIS — E785 Hyperlipidemia, unspecified: Secondary | ICD-10-CM | POA: Diagnosis not present

## 2020-07-07 DIAGNOSIS — Z0001 Encounter for general adult medical examination with abnormal findings: Secondary | ICD-10-CM | POA: Diagnosis not present

## 2020-07-07 DIAGNOSIS — Z0000 Encounter for general adult medical examination without abnormal findings: Secondary | ICD-10-CM

## 2020-07-07 MED ORDER — LISINOPRIL-HYDROCHLOROTHIAZIDE 20-12.5 MG PO TABS: 2.0000 | ORAL_TABLET | Freq: Every day | ORAL | 2 refills | Status: DC

## 2020-07-07 MED ORDER — LIVALO 2 MG PO TABS: 2.0000 mg | ORAL_TABLET | Freq: Every day | ORAL | 4 refills | Status: DC

## 2020-07-07 NOTE — Patient Instructions (Signed)

## 2020-07-07 NOTE — Progress Notes (Signed)
Subjective:    Patient ID: Bobby Ortiz, male    DOB: [birth year], 59 y.o.   MRN: [id number]  Chief Complaint  Patient presents with   Hypertension   Pt presents to the office today for CPE. He states he took the lipitor for about a month, but reports bilateral foot pain. He stopped it and his foot pain resolved.  Hypertension This is a chronic problem. The current episode started more than 1 year ago. The problem has been resolved since onset. The problem is controlled. Pertinent negatives include no malaise/fatigue, peripheral edema or shortness of breath. Risk factors for coronary artery disease include dyslipidemia, obesity, male gender and sedentary lifestyle. The current treatment provides moderate improvement. There is no history of CVA or heart failure.  Hyperlipidemia This is a chronic problem. The current episode started more than 1 year ago. The problem is controlled. Exacerbating diseases include obesity. Pertinent negatives include no shortness of breath. Current antihyperlipidemic treatment includes diet change. The current treatment provides mild improvement of lipids. Risk factors for coronary artery disease include dyslipidemia, hypertension and a sedentary lifestyle.      Review of Systems  Constitutional: Negative for malaise/fatigue.  Respiratory: Negative for shortness of breath.   All other systems reviewed and are negative.  Family History  Problem Relation Age of Onset   Heart disease Mother        Stents   Cancer Mother        Stomach   Heart disease Father        Heart attack   Social History   Socioeconomic History   Marital status: Married    Spouse name: Not on file   Number of children: Not on file   Years of education: Not on file   Highest education level: Not on file  Occupational History   Not on file  Tobacco Use   Smoking status: Never Smoker   Smokeless tobacco: Never Used  Vaping Use   Vaping Use: Never used    Substance and Sexual Activity   Alcohol use: No   Drug use: No   Sexual activity: Not on file  Other Topics Concern   Not on file  Social History Narrative   Not on file   Social Determinants of Health   Financial Resource Strain:    Difficulty of Paying Living Expenses: Not on file  Food Insecurity:    Worried About [place] in the Last Year: Not on file   Ran Out of Food in the Last Year: Not on file  Transportation Needs:    Lack of Transportation (Medical): Not on file   Lack of Transportation (Non-Medical): Not on file  Physical Activity:    Days of Exercise per Week: Not on file   Minutes of Exercise per Session: Not on file  Stress:    Feeling of Stress : Not on file  Social Connections:    Frequency of Communication with Friends and Family: Not on file   Frequency of Social Gatherings with Friends and Family: Not on file   Attends Religious Services: Not on file   Active Member of Clubs or Organizations: Not on file   Attends Archivist Meetings: Not on file   Marital Status: Not on file       Objective:   Physical Exam Vitals reviewed.  Constitutional:      General: He is not in acute distress.    Appearance: He is well-developed.  HENT:  Head: Normocephalic.     Right Ear: Tympanic membrane normal.     Left Ear: Tympanic membrane normal.  Eyes:     General:        Right eye: No discharge.        Left eye: No discharge.     Pupils: Pupils are equal, round, and reactive to light.  Neck:     Thyroid: No thyromegaly.  Cardiovascular:     Rate and Rhythm: Normal rate and regular rhythm.     Heart sounds: Normal heart sounds. No murmur heard.   Pulmonary:     Effort: Pulmonary effort is normal. No respiratory distress.     Breath sounds: Normal breath sounds. No wheezing.  Abdominal:     General: Bowel sounds are normal. There is no distension.     Palpations: Abdomen is soft.     Tenderness: There is no  abdominal tenderness.  Musculoskeletal:        General: No tenderness. Normal range of motion.     Cervical back: Normal range of motion and neck supple.  Skin:    General: Skin is warm and dry.     Findings: No erythema or rash.  Neurological:     Mental Status: He is alert and oriented to person, place, and time.     Cranial Nerves: No cranial nerve deficit.     Deep Tendon Reflexes: Reflexes are normal and symmetric.  Psychiatric:        Behavior: Behavior normal.        Thought Content: Thought content normal.        Judgment: Judgment normal.       BP 128/86    Pulse 76    Temp (!) 97.2 F (36.2 C) (Temporal)    Ht '6\' 3"'  (1.905 m)    Wt (!) 340 lb 3.2 oz (154.3 kg)    SpO2 95%    BMI 42.52 kg/m      Assessment & Plan:  Bobby Ortiz comes in today with chief complaint of Hypertension   Diagnosis and orders addressed:  1. Secondary hypertension - lisinopril-hydrochlorothiazide (ZESTORETIC) 20-12.5 MG tablet; Take 2 tablets by mouth daily.  Dispense: 180 tablet; Refill: 2 - CMP14+EGFR - CBC with Differential/Platelet  2. Morbid obesity (HCC) - lisinopril-hydrochlorothiazide (ZESTORETIC) 20-12.5 MG tablet; Take 2 tablets by mouth daily.  Dispense: 180 tablet; Refill: 2 - CMP14+EGFR - CBC with Differential/Platelet  3. Annual physical exam - CMP14+EGFR - CBC with Differential/Platelet - Lipid panel - TSH - PSA, total and free  4. Hyperlipidemia, unspecified hyperlipidemia type - CMP14+EGFR - CBC with Differential/Platelet - Lipid panel - Pitavastatin Calcium (LIVALO) 2 MG TABS; Take 1 tablet (2 mg total) by mouth daily.  Dispense: 90 tablet; Refill: 4  5. Colon cancer screening - Cologuard - CMP14+EGFR - CBC with Differential/Platelet   Labs pending Health Maintenance reviewed Diet and exercise encouraged  Follow up plan: 1 year   Evelina Dun, FNP

## 2020-07-08 LAB — CBC WITH DIFFERENTIAL/PLATELET
Basophils Absolute: 0.1 10*3/uL (ref 0.0–0.2)
Basos: 1 %
EOS (ABSOLUTE): 0.1 10*3/uL (ref 0.0–0.4)
Eos: 1 %
Hematocrit: 42 % (ref 37.5–51.0)
Hemoglobin: 14.6 g/dL (ref 13.0–17.7)
Immature Grans (Abs): 0 10*3/uL (ref 0.0–0.1)
Immature Granulocytes: 0 %
Lymphocytes Absolute: 2.7 10*3/uL (ref 0.7–3.1)
Lymphs: 38 %
MCH: 31.8 pg (ref 26.6–33.0)
MCHC: 34.8 g/dL (ref 31.5–35.7)
MCV: 92 fL (ref 79–97)
Monocytes Absolute: 0.4 10*3/uL (ref 0.1–0.9)
Monocytes: 6 %
Neutrophils Absolute: 3.8 10*3/uL (ref 1.4–7.0)
Neutrophils: 54 %
Platelets: 264 10*3/uL (ref 150–450)
RBC: 4.59 x10E6/uL (ref 4.14–5.80)
RDW: 13 % (ref 11.6–15.4)
WBC: 7 10*3/uL (ref 3.4–10.8)

## 2020-07-08 LAB — CMP14+EGFR
ALT: 21 IU/L (ref 0–44)
AST: 17 IU/L (ref 0–40)
Albumin/Globulin Ratio: 1.8 (ref 1.2–2.2)
Albumin: 4.7 g/dL (ref 3.8–4.9)
Alkaline Phosphatase: 83 IU/L (ref 44–121)
BUN/Creatinine Ratio: 13 (ref 9–20)
BUN: 18 mg/dL (ref 6–24)
Bilirubin Total: <0.2 mg/dL (ref 0.0–1.2)
CO2: 24 mmol/L (ref 20–29)
Calcium: 9.9 mg/dL (ref 8.7–10.2)
Chloride: 98 mmol/L (ref 96–106)
Creatinine, Ser: 1.34 mg/dL — ABNORMAL HIGH (ref 0.76–1.27)
GFR calc Af Amer: 67 mL/min/{1.73_m2} (ref >59)
GFR calc non Af Amer: 58 mL/min/{1.73_m2} — ABNORMAL LOW (ref >59)
Globulin, Total: 2.6 g/dL (ref 1.5–4.5)
Glucose: 106 mg/dL — ABNORMAL HIGH (ref 65–99)
Potassium: 4 mmol/L (ref 3.5–5.2)
Sodium: 136 mmol/L (ref 134–144)
Total Protein: 7.3 g/dL (ref 6.0–8.5)

## 2020-07-08 LAB — LIPID PANEL
Chol/HDL Ratio: 5.5 ratio — ABNORMAL HIGH (ref 0.0–5.0)
Cholesterol, Total: 188 mg/dL (ref 100–199)
HDL: 34 mg/dL — ABNORMAL LOW (ref >39)
LDL Chol Calc (NIH): 108 mg/dL — ABNORMAL HIGH (ref 0–99)
Triglycerides: 266 mg/dL — ABNORMAL HIGH (ref 0–149)
VLDL Cholesterol Cal: 46 mg/dL — ABNORMAL HIGH (ref 5–40)

## 2020-07-08 LAB — TSH: TSH: 3.65 u[IU]/mL (ref 0.450–4.500)

## 2020-07-08 LAB — PSA, TOTAL AND FREE
PSA, Free Pct: 24.1 %
PSA, Free: 0.53 ng/mL
Prostate Specific Ag, Serum: 2.2 ng/mL (ref 0.0–4.0)

## 2020-07-13 ENCOUNTER — Telehealth: Payer: Self-pay

## 2020-07-13 DIAGNOSIS — E785 Hyperlipidemia, unspecified: Secondary | ICD-10-CM

## 2020-07-13 MED ORDER — PRAVASTATIN SODIUM 80 MG PO TABS: 80.0000 mg | ORAL_TABLET | Freq: Every day | ORAL | 3 refills | Status: DC

## 2020-07-13 MED ORDER — LIVALO 2 MG PO TABS: 2.0000 mg | ORAL_TABLET | Freq: Every day | ORAL | 4 refills | Status: DC

## 2020-07-13 NOTE — Telephone Encounter (Signed)
Left detailed message per dpr- medication sent

## 2020-07-13 NOTE — Telephone Encounter (Signed)
Patient states that Livalo is too expensive- requesting to change to prevachol.  [facility]. Covering PCP- please advise

## 2020-07-16 ENCOUNTER — Ambulatory Visit: Payer: Commercial Managed Care - PPO | Admitting: Family Medicine

## 2020-07-16 ENCOUNTER — Ambulatory Visit (INDEPENDENT_AMBULATORY_CARE_PROVIDER_SITE_OTHER): Payer: Commercial Managed Care - PPO

## 2020-07-16 ENCOUNTER — Other Ambulatory Visit: Payer: Self-pay

## 2020-07-16 ENCOUNTER — Encounter: Payer: Self-pay | Admitting: Family Medicine

## 2020-07-16 VITALS — BP 113/73 | HR 85 | Temp 98.1°F | Ht 75.0 in | Wt 340.0 lb

## 2020-07-16 DIAGNOSIS — M79671 Pain in right foot: Secondary | ICD-10-CM

## 2020-07-16 DIAGNOSIS — M25471 Effusion, right ankle: Secondary | ICD-10-CM | POA: Diagnosis not present

## 2020-07-16 MED ORDER — PREDNISONE 10 MG PO TABS: ORAL_TABLET | ORAL | 0 refills | Status: DC

## 2020-07-16 MED ORDER — TRAMADOL HCL 50 MG PO TABS
50.0000 mg | ORAL_TABLET | Freq: Four times a day (QID) | ORAL | 0 refills | Status: AC
Start: 2020-07-16 — End: 2020-07-21

## 2020-07-16 NOTE — Progress Notes (Signed)
Chief Complaint  Patient presents with  . Ankle Pain    right, joint, No injury    HPI  Patient presents today for excruciating right ankle pain for 2 to 3 days. He is having some much pain that he cannot bear weight. He did not have any injury. This is happened a couple of times before earlier this year. He did not have injury on those occasions either. He is having pain at the anterior joint line. There is swelling accompanying it and he says it feels tight inside the joint.  PMH: Smoking status noted ROS: Per HPI  Objective: BP 113/73   Pulse 85   Temp 98.1 F (36.7 C) (Temporal)   Ht _0  (1.905 m)   Wt (!) 340 lb (154.2 kg)   BMI 42.50 kg/m  Gen: NAD, alert, cooperative with exam Ext: There is moderate edema at the anterior aspect of the right ankle extending to each malleolus. He is tender at the talofibular and talotibial joint areas at the malleolar line and anteriorly from them. There is 2+ edema. There is decreased range of motion at the ankle. No opening for eversion inversion or drawer sign. He has full range of motion distally and proximally from the ankle. He is nontender from the midfoot to the forefoot. Neuro: Alert and oriented, No gross deficits No acute abnormality on x-ray of the right ankle, Preliminary reading done by Randell Loop  Assessment and plan:  1. Right foot pain   2. Edema of right ankle     Meds ordered this encounter  Medications  . predniSONE (DELTASONE) 10 MG tablet    Sig: Take 5 daily for 3 days followed by 4,3,2 and 1 for 3 days each.    Dispense:  45 tablet    Refill:  0  . traMADol (ULTRAM) 50 MG tablet    Sig: Take 1 tablet (50 mg total) by mouth 4 (four) times daily for 5 days. 1-2 tablets up to 4 times a day as needed for pain    Dispense:  20 tablet    Refill:  0  Cast boot fitted.  Patient to be out of work for a week.  After that when he goes back to work he should continue with the boot until the swelling and pain are  gone.  1 week out of work note given.  Orders Placed This Encounter  Procedures  . DG Ankle Complete Right    Standing Status:   Future    Number of Occurrences:   1    Standing Expiration Date:   08/16/2020    Order Specific Question:   Reason for Exam (SYMPTOM  OR DIAGNOSIS REQUIRED)    Answer:   pain for 2 days, NKI. Can't bear weight    Order Specific Question:   Preferred imaging location?    Answer:   Internal  . CBC with Differential/Platelet  . Uric acid  . BMP8+EGFR    Order Specific Question:   Has the patient fasted?    Answer:   No    Follow up as needed.  Claretta Fraise, MD

## 2020-07-17 LAB — CBC WITH DIFFERENTIAL/PLATELET
Basophils Absolute: 0.1 10*3/uL (ref 0.0–0.2)
Basos: 1 %
EOS (ABSOLUTE): 0.1 10*3/uL (ref 0.0–0.4)
Eos: 1 %
Hematocrit: 43.4 % (ref 37.5–51.0)
Hemoglobin: 15.2 g/dL (ref 13.0–17.7)
Immature Grans (Abs): 0 10*3/uL (ref 0.0–0.1)
Immature Granulocytes: 0 %
Lymphocytes Absolute: 2.3 10*3/uL (ref 0.7–3.1)
Lymphs: 25 %
MCH: 32.1 pg (ref 26.6–33.0)
MCHC: 35 g/dL (ref 31.5–35.7)
MCV: 92 fL (ref 79–97)
Monocytes Absolute: 0.6 10*3/uL (ref 0.1–0.9)
Monocytes: 6 %
Neutrophils Absolute: 6 10*3/uL (ref 1.4–7.0)
Neutrophils: 67 %
Platelets: 295 10*3/uL (ref 150–450)
RBC: 4.74 x10E6/uL (ref 4.14–5.80)
RDW: 13 % (ref 11.6–15.4)
WBC: 9 10*3/uL (ref 3.4–10.8)

## 2020-07-17 LAB — BMP8+EGFR
BUN/Creatinine Ratio: 18 (ref 9–20)
BUN: 23 mg/dL (ref 6–24)
CO2: 22 mmol/L (ref 20–29)
Calcium: 9.7 mg/dL (ref 8.7–10.2)
Chloride: 96 mmol/L (ref 96–106)
Creatinine, Ser: 1.27 mg/dL (ref 0.76–1.27)
GFR calc Af Amer: 71 mL/min/{1.73_m2} (ref >59)
GFR calc non Af Amer: 61 mL/min/{1.73_m2} (ref >59)
Glucose: 120 mg/dL — ABNORMAL HIGH (ref 65–99)
Potassium: 4.2 mmol/L (ref 3.5–5.2)
Sodium: 135 mmol/L (ref 134–144)

## 2020-07-17 LAB — URIC ACID: Uric Acid: 11.1 mg/dL — ABNORMAL HIGH (ref 3.8–8.4)

## 2020-07-20 ENCOUNTER — Other Ambulatory Visit: Payer: Self-pay | Admitting: Family Medicine

## 2020-07-20 ENCOUNTER — Telehealth: Payer: Self-pay

## 2020-07-20 MED ORDER — ALLOPURINOL 100 MG PO TABS: 100.0000 mg | ORAL_TABLET | Freq: Every day | ORAL | 6 refills | Status: DC

## 2020-07-20 MED ORDER — COLCHICINE 0.6 MG PO TABS: ORAL_TABLET | ORAL | 2 refills | Status: DC

## 2020-07-20 NOTE — Telephone Encounter (Signed)
Line busy x 2

## 2020-07-21 NOTE — Telephone Encounter (Signed)
Multiple attempts made to contact patient.  This encounter will now be closed  

## 2020-09-28 ENCOUNTER — Other Ambulatory Visit: Payer: Self-pay

## 2020-09-28 ENCOUNTER — Ambulatory Visit: Payer: Commercial Managed Care - PPO | Admitting: Nurse Practitioner

## 2020-09-28 ENCOUNTER — Encounter: Payer: Self-pay | Admitting: Nurse Practitioner

## 2020-09-28 VITALS — BP 119/82 | HR 57 | Temp 97.9°F | Resp 20 | Ht 75.0 in | Wt 329.0 lb

## 2020-09-28 DIAGNOSIS — M25561 Pain in right knee: Secondary | ICD-10-CM

## 2020-09-28 MED ORDER — TRAMADOL HCL 50 MG PO TABS
50.0000 mg | ORAL_TABLET | Freq: Three times a day (TID) | ORAL | 0 refills | Status: AC | PRN
Start: 2020-09-28 — End: 2020-10-03

## 2020-09-28 MED ORDER — PREDNISONE 10 MG (21) PO TBPK: ORAL_TABLET | ORAL | 0 refills | Status: DC

## 2020-09-28 NOTE — Patient Instructions (Signed)

## 2020-09-28 NOTE — Progress Notes (Signed)
   Subjective:    Patient ID: Bobby Ortiz, male    DOB: 02/15/1961, 60 y.o.   MRN: [id number]   Chief Complaint: Knee Pain   HPI Patient comes in today with his wife. He is c/o right knee pain. He said it started hurting last Thursday. He is not aware of any injury. He has had it propped up all weekend. When he first gets up it hurts and gets a little better once he continues to walk. Has some swelling. Rates pain / currently.   Review of Systems  Constitutional: Negative for diaphoresis.  Eyes: Negative for pain.  Respiratory: Negative for shortness of breath.   Cardiovascular: Negative for chest pain, palpitations and leg swelling.  Gastrointestinal: Negative for abdominal pain.  Endocrine: Negative for polydipsia.  Musculoskeletal: Positive for arthralgias (right knee).  Skin: Negative for rash.  Neurological: Negative for dizziness, weakness and headaches.  Hematological: Does not bruise/bleed easily.  All other systems reviewed and are negative.      Objective:   Physical Exam Vitals and nursing note reviewed.  Constitutional:      Appearance: Normal appearance.  Cardiovascular:     Rate and Rhythm: Normal rate and regular rhythm.     Heart sounds: Normal heart sounds.  Pulmonary:     Breath sounds: Normal breath sounds.  Musculoskeletal:     Comments: Mild right knee effusion FROM with pain on flexion and extension All ligaments intact  Neurological:     Mental Status: He is alert.    BP 119/82   Pulse (!) 57   Temp 97.9 F (36.6 C) (Temporal)   Resp 20   Ht 6\' 3"  (1.905 m)   Wt (!) 329 lb (149.2 kg)   SpO2 96%   BMI 41.12 kg/m         Assessment & Plan:  in today with chief complaint of Knee Pain   1. Acute pain of right knee Ice bid  Elevated Compression wrap rest - predniSONE (STERAPRED UNI-PAK 21 TAB) 10 MG (21) TBPK tablet; As directed x 6 days  Dispense: 21 tablet; Refill: 0    The above assessment and management  plan was discussed with the patient. The patient verbalized understanding of and has agreed to the management plan. Patient is aware to call the clinic if symptoms persist or worsen. Patient is aware when to return to the clinic for a follow-up visit. Patient educated on when it is appropriate to go to the emergency department.   Mary-Margaret Larene Pickett, FNP

## 2020-10-02 ENCOUNTER — Telehealth: Payer: Self-pay

## 2020-10-02 DIAGNOSIS — M25561 Pain in right knee: Secondary | ICD-10-CM

## 2020-10-02 NOTE — Telephone Encounter (Signed)
REFERRAL REQUEST Telephone Note  Have you been seen at our office for this problem? Yes, on 09-29-2019 w/MMM (Advise that they may need an appointment with their PCP before a referral can be done)  Reason for Referral: knee pain Referral discussed with patient: yes, MRI Best contact number of patient for referral team:   [phone] Has patient been seen by a specialist for this issue before: no Patient provider preference for referral: MRI? Patient location preference for referral: Where MRI is BIG enough to fit him in it bc he is a big guy.   Patient notified that referrals can take up to a week or longer to process. If they haven't heard anything within a week they should call back and speak with the referral department.   MMM's pt.  Please call pt.

## 2020-10-05 ENCOUNTER — Telehealth: Payer: Self-pay

## 2020-10-05 NOTE — Telephone Encounter (Signed)
Please advise.  Patient saw you on 09/28/20 for knee pain and was prescribed Prednisone dose pack.  Looks like he sent a message on 10/02/20 requesting MRI of knee and Toni Amend forwarded this request to [facility] for referral approval.

## 2020-10-06 NOTE — Telephone Encounter (Signed)
I have ordered a MRI and placed a referral to Ortho placed.

## 2020-10-06 NOTE — Telephone Encounter (Signed)
Patient aware and verbalizes understanding. 

## 2020-10-06 NOTE — Telephone Encounter (Signed)
Waiting on provider to review  

## 2020-10-07 ENCOUNTER — Telehealth: Payer: Self-pay

## 2020-10-07 MED ORDER — PREDNISONE 10 MG (21) PO TBPK: ORAL_TABLET | ORAL | 0 refills | Status: DC

## 2020-10-07 NOTE — Telephone Encounter (Signed)
Patient aware and verbalized understanding. Note printed and up front

## 2020-10-07 NOTE — Telephone Encounter (Signed)
Pt wants to be referred to see someone in [place] for his knee.   Requested to speak with referrals.

## 2020-10-07 NOTE — Telephone Encounter (Signed)
Prednisone dose pack sent to pharmacy. I placed a referral to Ortho the other day and he he hear back from this shortly. Ok to give work note until next Monday.

## 2020-10-07 NOTE — Telephone Encounter (Signed)
Pt called stating that he has called our office several times to get update on if MMM approved to send him in more Prednisone for his knee pain, but has not heard anything from anyone.  Pt says he is in a lot of pain and needs a refill. Pt also needs a note for work since he has been out.

## 2020-10-20 ENCOUNTER — Ambulatory Visit (HOSPITAL_COMMUNITY): Payer: BLUE CROSS/BLUE SHIELD

## 2021-02-01 ENCOUNTER — Encounter: Payer: Self-pay | Admitting: Family Medicine

## 2021-04-10 ENCOUNTER — Other Ambulatory Visit: Payer: Self-pay | Admitting: Family

## 2021-04-10 DIAGNOSIS — I159 Secondary hypertension, unspecified: Secondary | ICD-10-CM

## 2021-04-12 ENCOUNTER — Telehealth (INDEPENDENT_AMBULATORY_CARE_PROVIDER_SITE_OTHER): Payer: Commercial Managed Care - PPO | Admitting: Family

## 2021-04-12 ENCOUNTER — Encounter: Payer: Self-pay | Admitting: Family

## 2021-04-12 DIAGNOSIS — U071 COVID-19: Secondary | ICD-10-CM

## 2021-04-12 MED ORDER — MOLNUPIRAVIR EUA 200MG CAPSULE: 4.0000 | ORAL_CAPSULE | Freq: Two times a day (BID) | ORAL | 0 refills | Status: AC

## 2021-04-12 MED ORDER — ALBUTEROL SULFATE HFA 108 (90 BASE) MCG/ACT IN AERS: 2.0000 | INHALATION_SPRAY | Freq: Four times a day (QID) | RESPIRATORY_TRACT | 0 refills | Status: DC | PRN

## 2021-04-12 NOTE — Telephone Encounter (Signed)
OV 07/07/21 rtc 1 yr

## 2021-04-12 NOTE — Progress Notes (Signed)
Virtual Visit  Note Due to COVID-19 pandemic this visit was conducted virtually. This visit type was conducted due to national recommendations for restrictions regarding the COVID-19 Pandemic (e.g. social distancing, sheltering in place) in an effort to limit this patient's exposure and mitigate transmission in our community. All issues noted in this document were discussed and addressed.  A physical exam was not performed with this format.  I connected with Bobby Ortiz on 04/12/21 at 1:21 pm  by telephone and verified that I am speaking with the correct person using two identifiers. Bobby Ortiz is currently located at home and his wife is currently with him  during visit. The provider, Jannifer Rodney, FNP is located in their office at time of visit.  I discussed the limitations, risks, security and privacy concerns of performing an evaluation and management service by telephone and the availability of in person appointments. I also discussed with the patient that there may be a patient responsible charge related to this service. The patient expressed understanding and agreed to proceed.   Bobby Ortiz, Bobby Ortiz are scheduled for a virtual visit with your provider today.    Just as we do with appointments in the office, we must obtain your consent to participate.  Your consent will be active for this visit and any virtual visit you may have with one of our providers in the next 365 days.    If you have a MyChart account, I can also send a copy of this consent to you electronically.  All virtual visits are billed to your insurance company just like a traditional visit in the office.  As this is a virtual visit, video technology does not allow for your provider to perform a traditional examination.  This may limit your provider's ability to fully assess your condition.  If your provider identifies any concerns that need to be evaluated in person or the need to arrange testing such as labs, EKG, etc, we  will make arrangements to do so.    Although advances in technology are sophisticated, we cannot ensure that it will always work on either your end or our end.  If the connection with a video visit is poor, we may have to switch to a telephone visit.  With either a video or telephone visit, we are not always able to ensure that we have a secure connection.   I need to obtain your verbal consent now.   Are you willing to proceed with your visit today?   Bobby Ortiz has provided verbal consent on 04/12/2021 for a virtual visit (video or telephone).   Jannifer Rodney, [place] 04/12/2021  1:14 PM    History and Present Illness:  He calls the office today with COVID. He reports his symptoms started this morning and tested positive today. His mother had COVID last week and has been testing every day.  Cough This is a new problem. The current episode started in the past 7 days. The problem has been gradually worsening. The cough is Non-productive. Associated symptoms include chills, headaches, myalgias, nasal congestion, postnasal drip and rhinorrhea. Pertinent negatives include no ear congestion, ear pain, fever, shortness of breath or wheezing. The symptoms are aggravated by lying down. He has tried rest and OTC cough suppressant for the symptoms. The treatment provided mild relief.     Review of Systems  Constitutional:  Positive for chills. Negative for fever.  HENT:  Positive for postnasal drip and rhinorrhea. Negative for ear pain.   Respiratory:  Positive  for cough. Negative for shortness of breath and wheezing.   Musculoskeletal:  Positive for myalgias.  Neurological:  Positive for headaches.    Observations/Objective: No SOB or distress noted, nasal congestion & hoarse voice  Assessment and Plan: 1. COVID-19 virus detected COVID positive, rest, force fluids, tylenol as needed, Quarantine for at least 5 days and fever free, report any worsening symptoms such as increased shortness of  breath, swelling, or continued high fevers.  Possible adverse effects discussed with molnupiravir  - molnupiravir EUA 200 mg CAPS; Take 4 capsules (800 mg total) by mouth 2 (two) times daily for 5 days.  Dispense: 40 capsule; Refill: 0 - albuterol (VENTOLIN HFA) 108 (90 Base) MCG/ACT inhaler; Inhale 2 puffs into the lungs every 6 (six) hours as needed for wheezing or shortness of breath.  Dispense: 8 g; Refill: 0     I discussed the assessment and treatment plan with the patient. The patient was provided an opportunity to ask questions and all were answered. The patient agreed with the plan and demonstrated an understanding of the instructions.   The patient was advised to call back or seek an in-person evaluation if the symptoms worsen or if the condition fails to improve as anticipated.  The above assessment and management plan was discussed with the patient. The patient verbalized understanding of and has agreed to the management plan. Patient is aware to call the clinic if symptoms persist or worsen. Patient is aware when to return to the clinic for a follow-up visit. Patient educated on when it is appropriate to go to the emergency department.   Time call ended:  1:32 pm   I provided 11 minutes of  non face-to-face time during this encounter.    Jannifer Rodney, FNP

## 2021-05-10 ENCOUNTER — Other Ambulatory Visit: Payer: Self-pay | Admitting: Family

## 2021-05-10 DIAGNOSIS — I159 Secondary hypertension, unspecified: Secondary | ICD-10-CM

## 2021-05-20 ENCOUNTER — Other Ambulatory Visit: Payer: Self-pay | Admitting: Family Medicine

## 2021-05-21 NOTE — Telephone Encounter (Signed)
NEEDS TO BE SEEN FOR FURTHER REFILLS.

## 2021-06-21 ENCOUNTER — Other Ambulatory Visit: Payer: Self-pay | Admitting: Family

## 2021-06-21 NOTE — Telephone Encounter (Signed)
[place] NTBS 30 days given 05/21/21

## 2021-07-23 ENCOUNTER — Telehealth: Payer: Self-pay | Admitting: Family

## 2021-07-23 ENCOUNTER — Other Ambulatory Visit: Payer: Self-pay | Admitting: Family

## 2021-07-23 MED ORDER — COLCHICINE 0.6 MG PO TABS: ORAL_TABLET | ORAL | 0 refills | Status: DC

## 2021-07-23 NOTE — Telephone Encounter (Signed)
Refill sent.

## 2021-07-23 NOTE — Telephone Encounter (Signed)
  Prescription Request  07/23/2021  Is this a "Controlled Substance" medicine? no  Have you seen your PCP in the last 2 weeks? NO  If YES, route message to pool  -  If NO, patient needs to be scheduled for appointment.  What is the name of the medication or equipment? Colchicine 0.6 mg  Have you contacted your pharmacy to request a refill? yes   [facility] you [facility]   Patient notified that their request is being sent to the clinical staff for review and that they should receive a response within 2 business days.

## 2021-08-09 ENCOUNTER — Other Ambulatory Visit: Payer: Self-pay | Admitting: Family

## 2021-08-09 DIAGNOSIS — I159 Secondary hypertension, unspecified: Secondary | ICD-10-CM

## 2021-08-11 ENCOUNTER — Other Ambulatory Visit: Payer: Self-pay

## 2021-08-11 ENCOUNTER — Encounter: Payer: Self-pay | Admitting: Family

## 2021-08-11 ENCOUNTER — Ambulatory Visit: Payer: Commercial Managed Care - PPO | Admitting: Family

## 2021-08-11 VITALS — BP 128/85 | HR 65 | Temp 98.0°F | Ht 75.0 in | Wt 338.0 lb

## 2021-08-11 DIAGNOSIS — E785 Hyperlipidemia, unspecified: Secondary | ICD-10-CM

## 2021-08-11 DIAGNOSIS — I159 Secondary hypertension, unspecified: Secondary | ICD-10-CM | POA: Diagnosis not present

## 2021-08-11 DIAGNOSIS — Z0000 Encounter for general adult medical examination without abnormal findings: Secondary | ICD-10-CM

## 2021-08-11 DIAGNOSIS — Z0001 Encounter for general adult medical examination with abnormal findings: Secondary | ICD-10-CM

## 2021-08-11 DIAGNOSIS — M109 Gout, unspecified: Secondary | ICD-10-CM

## 2021-08-11 MED ORDER — LISINOPRIL 40 MG PO TABS: 40.0000 mg | ORAL_TABLET | Freq: Every day | ORAL | 3 refills | Status: DC

## 2021-08-11 MED ORDER — AMLODIPINE BESYLATE 5 MG PO TABS: 5.0000 mg | ORAL_TABLET | Freq: Every day | ORAL | 0 refills | Status: DC

## 2021-08-11 MED ORDER — COLCHICINE 0.6 MG PO TABS: ORAL_TABLET | ORAL | 0 refills | Status: DC

## 2021-08-11 MED ORDER — PRAVASTATIN SODIUM 80 MG PO TABS: 80.0000 mg | ORAL_TABLET | Freq: Every day | ORAL | 3 refills | Status: DC

## 2021-08-11 MED ORDER — ALLOPURINOL 100 MG PO TABS: ORAL_TABLET | ORAL | 3 refills | Status: DC

## 2021-08-11 NOTE — Patient Instructions (Signed)
Gout ?Gout is a condition that causes painful swelling of the joints. Gout is a type of inflammation of the joints (arthritis). This condition is caused by having too much uric acid in the body. Uric acid is a chemical that forms when the body breaks down substances called purines. Purines are important for building body proteins. ?When the body has too much uric acid, sharp crystals can form and build up inside the joints. This causes pain and swelling. Gout attacks can happen quickly and may be very painful (acute gout). Over time, the attacks can affect more joints and become more frequent (chronic gout). Gout can also cause uric acid to build up under the skin and inside the kidneys. ?What are the causes? ?This condition is caused by too much uric acid in your blood. This can happen because: ?Your kidneys do not remove enough uric acid from your blood. This is the most common cause. ?Your body makes too much uric acid. This can happen with some cancers and cancer treatments. It can also occur if your body is breaking down too many red blood cells (hemolytic anemia). ?You eat too many foods that are high in purines. These foods include organ meats and some seafood. Alcohol, especially beer, is also high in purines. ?A gout attack may be triggered by trauma or stress. ?What increases the risk? ?You are more likely to develop this condition if you: ?Have a family history of gout. ?Are male and middle-aged. ?Are male and have gone through menopause. ?Are obese. ?Frequently drink alcohol, especially beer. ?Are dehydrated. ?Lose weight too quickly. ?Have an organ transplant. ?Have lead poisoning. ?Take certain medicines, including aspirin, cyclosporine, diuretics, levodopa, and niacin. ?Have kidney disease. ?Have a skin condition called psoriasis. ?What are the signs or symptoms? ?An attack of acute gout happens quickly. It usually occurs in just one joint. The most common place is the big toe. Attacks often start  at night. Other joints that may be affected include joints of the feet, ankle, knee, fingers, wrist, or elbow. Symptoms of this condition may include: ?Severe pain. ?Warmth. ?Swelling. ?Stiffness. ?Tenderness. The affected joint may be very painful to touch. ?Shiny, red, or purple skin. ?Chills and fever. ?Chronic gout may cause symptoms more frequently. More joints may be involved. You may also have white or yellow lumps (tophi) on your hands or feet or in other areas near your joints. ?How is this diagnosed? ?This condition is diagnosed based on your symptoms, medical history, and physical exam. You may have tests, such as: ?Blood tests to measure uric acid levels. ?Removal of joint fluid with a thin needle (aspiration) to look for uric acid crystals. ?X-rays to look for joint damage. ?How is this treated? ?Treatment for this condition has two phases: treating an acute attack and preventing future attacks. Acute gout treatment may include medicines to reduce pain and swelling, including: ?NSAIDs. ?Steroids. These are strong anti-inflammatory medicines that can be taken by mouth (orally) or injected into a joint. ?Colchicine. This medicine relieves pain and swelling when it is taken soon after an attack. It can be given by mouth or through an IV. ?Preventive treatment may include: ?Daily use of smaller doses of NSAIDs or colchicine. ?Use of a medicine that reduces uric acid levels in your blood. ?Changes to your diet. You may need to see a dietitian about what to eat and drink to prevent gout. ?Follow these instructions at home: ?During a gout attack ? ?If directed, put ice on the affected area: ?  Put ice in a plastic bag. ?Place a towel between your skin and the bag. ?Leave the ice on for 20 minutes, 2-3 times a day. ?Raise (elevate) the affected joint above the level of your heart as often as possible. ?Rest the joint as much as possible. If the affected joint is in your leg, you may be given crutches to  use. ?Follow instructions from your health care provider about eating or drinking restrictions. ?Avoiding future gout attacks ?Follow a low-purine diet as told by your dietitian or health care provider. Avoid foods and drinks that are high in purines, including liver, kidney, anchovies, asparagus, herring, mushrooms, mussels, and beer. ?Maintain a healthy weight or lose weight if you are overweight. If you want to lose weight, talk with your health care provider. It is important that you do not lose weight too quickly. ?Start or maintain an exercise program as told by your health care provider. ?Eating and drinking ?Drink enough fluids to keep your urine pale yellow. ?If you drink alcohol: ?Limit how much you use to: ?0-1 drink a day for women. ?0-2 drinks a day for men. ?Be aware of how much alcohol is in your drink. In the [place] one drink equals one 12 oz bottle of beer (355 mL) one 5 oz glass of wine (148 mL), or one 1? oz glass of hard liquor (44 mL). ?General instructions ?Take over-the-counter and prescription medicines only as told by your health care provider. ?Do not drive or use heavy machinery while taking prescription pain medicine. ?Return to your normal activities as told by your health care provider. Ask your health care provider what activities are safe for you. ?Keep all follow-up visits as told by your health care provider. This is important. ?Contact a health care provider if you have: ?Another gout attack. ?Continuing symptoms of a gout attack after 10 days of treatment. ?Side effects from your medicines. ?Chills or a fever. ?Burning pain when you urinate. ?Pain in your lower back or belly. ?Get help right away if you: ?Have severe or uncontrolled pain. ?Cannot urinate. ?Summary ?Gout is painful swelling of the joints caused by inflammation. ?The most common site of pain is the big toe, but it can affect other joints in the body. ?Medicines and dietary changes can help to prevent and treat gout  attacks. ?This information is not intended to replace advice given to you by your health care provider. Make sure you discuss any questions you have with your health care provider. ?Document Revised: 03/23/2018 Document Reviewed: 04/04/2018 ?Elsevier Patient Education ? 2022 [employer] ? ?

## 2021-08-11 NOTE — Progress Notes (Signed)
Subjective:    Patient ID: Bobby Ortiz, male    DOB: 05/16/1961, 60 y.o.   MRN: [id number]  Chief Complaint  Patient presents with   Medical Management of Chronic Issues   Pt presents to the office today for CPE. He has gout and takes allopurinol 100 mg and uses colchicine as needed. His last flare up was 3 months ago.  Hyperlipidemia This is a chronic problem. The current episode started more than 1 year ago. The problem is uncontrolled. Exacerbating diseases include obesity. Pertinent negatives include no shortness of breath. Current antihyperlipidemic treatment includes statins. The current treatment provides moderate improvement of lipids. Risk factors for coronary artery disease include dyslipidemia, male sex, hypertension and a sedentary lifestyle.  Hypertension This is a chronic problem. The current episode started more than 1 year ago. The problem has been resolved since onset. The problem is controlled. Pertinent negatives include no malaise/fatigue, peripheral edema or shortness of breath. Past treatments include ACE inhibitors and diuretics. The current treatment provides moderate improvement.     Review of Systems  Constitutional:  Negative for malaise/fatigue.  Respiratory:  Negative for shortness of breath.   All other systems reviewed and are negative.  Family History  Problem Relation Age of Onset   Heart disease Mother        Stents   Cancer Mother        Stomach   Heart disease Father        Heart attack   Social History   Socioeconomic History   Marital status: Married    Spouse name: Not on file   Number of children: Not on file   Years of education: Not on file   Highest education level: Not on file  Occupational History   Not on file  Tobacco Use   Smoking status: Never   Smokeless tobacco: Never  Vaping Use   Vaping Use: Never used  Substance and Sexual Activity   Alcohol use: No   Drug use: No   Sexual activity: Not on file  Other Topics  Concern   Not on file  Social History Narrative   Not on file   Social Determinants of Health   Financial Resource Strain: Not on file  Food Insecurity: Not on file  Transportation Needs: Not on file  Physical Activity: Not on file  Stress: Not on file  Social Connections: Not on file       Objective:   Physical Exam Vitals reviewed.  Constitutional:      General: He is not in acute distress.    Appearance: He is well-developed. He is obese.  HENT:     Head: Normocephalic.     Right Ear: Tympanic membrane normal.     Left Ear: Tympanic membrane normal.  Eyes:     General:        Right eye: No discharge.        Left eye: No discharge.     Pupils: Pupils are equal, round, and reactive to light.  Neck:     Thyroid: No thyromegaly.  Cardiovascular:     Rate and Rhythm: Normal rate and regular rhythm.     Heart sounds: Normal heart sounds. No murmur heard. Pulmonary:     Effort: Pulmonary effort is normal. No respiratory distress.     Breath sounds: Normal breath sounds. No wheezing.  Abdominal:     General: Bowel sounds are normal. There is no distension.     Palpations: Abdomen is soft.  Tenderness: There is no abdominal tenderness.  Musculoskeletal:        General: No tenderness. Normal range of motion.     Cervical back: Normal range of motion and neck supple.  Skin:    General: Skin is warm and dry.     Findings: No erythema or rash.  Neurological:     Mental Status: He is alert and oriented to person, place, and time.     Cranial Nerves: No cranial nerve deficit.     Deep Tendon Reflexes: Reflexes are normal and symmetric.  Psychiatric:        Behavior: Behavior normal.        Thought Content: Thought content normal.        Judgment: Judgment normal.     BP 128/85   Pulse 65   Temp 98 F (36.7 C) (Temporal)   Ht '6\' 3"'  (1.905 m)   Wt (!) 338 lb (153.3 kg)   BMI 42.25 kg/m      Assessment & Plan:  Bobby Ortiz comes in today with chief  complaint of Medical Management of Chronic Issues   Diagnosis and orders addressed:  1. Secondary hypertension Stop HCTZ because of gout and start Norvasc 5 mg  RTO in 2 months to recheck - CMP14+EGFR - CBC with Differential/Platelet - lisinopril (ZESTRIL) 40 MG tablet; Take 1 tablet (40 mg total) by mouth daily.  Dispense: 90 tablet; Refill: 3 - amLODipine (NORVASC) 5 MG tablet; Take 1 tablet (5 mg total) by mouth daily.  Dispense: 90 tablet; Refill: 0  2. Morbid obesity ([place]) - CMP14+EGFR - CBC with Differential/Platelet  3. Annual physical exam - CMP14+EGFR - CBC with Differential/Platelet - Lipid panel - TSH - Uric acid  4. Hyperlipidemia, unspecified hyperlipidemia type - pravastatin (PRAVACHOL) 80 MG tablet; Take 1 tablet (80 mg total) by mouth daily.  Dispense: 90 tablet; Refill: 3 - CMP14+EGFR - CBC with Differential/Platelet - Lipid panel  5. Acute gout of multiple sites, unspecified cause Will stop HCTZ  Force fluids Low purine diet - colchicine 0.6 MG tablet; Take twice daily for gout attack. (may take every two hours up to 6 doses at acute onset)  Dispense: 60 tablet; Refill: 0 - allopurinol (ZYLOPRIM) 100 MG tablet; TAKE ONE (1) TABLET EACH DAY  Dispense: 90 tablet; Refill: 3 - CMP14+EGFR - CBC with Differential/Platelet   Labs pending Health Maintenance reviewed Diet and exercise encouraged  Follow up plan: 2 months to recheck HTN and gout   Evelina Dun, FNP

## 2021-09-08 ENCOUNTER — Other Ambulatory Visit: Payer: Self-pay | Admitting: Family

## 2021-09-26 DIAGNOSIS — A419 Sepsis, unspecified organism: Secondary | ICD-10-CM

## 2021-09-26 DIAGNOSIS — N179 Acute kidney failure, unspecified: Secondary | ICD-10-CM

## 2021-09-26 DIAGNOSIS — N1831 Chronic kidney disease, stage 3a: Secondary | ICD-10-CM

## 2021-09-26 HISTORY — DX: Sepsis, unspecified organism: A41.9

## 2021-09-26 HISTORY — DX: Chronic kidney disease, stage 3a: N18.31

## 2021-09-26 HISTORY — DX: Acute kidney failure, unspecified: N17.9

## 2021-10-08 ENCOUNTER — Other Ambulatory Visit: Payer: Self-pay | Admitting: Family

## 2021-10-08 DIAGNOSIS — I159 Secondary hypertension, unspecified: Secondary | ICD-10-CM

## 2021-10-13 ENCOUNTER — Emergency Department (HOSPITAL_BASED_OUTPATIENT_CLINIC_OR_DEPARTMENT_OTHER): Payer: Commercial Managed Care - PPO

## 2021-10-13 ENCOUNTER — Encounter (HOSPITAL_BASED_OUTPATIENT_CLINIC_OR_DEPARTMENT_OTHER): Payer: Self-pay

## 2021-10-13 ENCOUNTER — Inpatient Hospital Stay (HOSPITAL_BASED_OUTPATIENT_CLINIC_OR_DEPARTMENT_OTHER)
Admission: EM | Admit: 2021-10-13 | Discharge: 2021-10-15 | DRG: 872 | Disposition: A | Discharge status: Home / Self Care | Payer: Commercial Managed Care - PPO | Attending: Internal Medicine | Admitting: Internal Medicine

## 2021-10-13 ENCOUNTER — Other Ambulatory Visit: Payer: Self-pay

## 2021-10-13 DIAGNOSIS — N1831 Chronic kidney disease, stage 3a: Secondary | ICD-10-CM | POA: Diagnosis present

## 2021-10-13 DIAGNOSIS — Z9049 Acquired absence of other specified parts of digestive tract: Secondary | ICD-10-CM

## 2021-10-13 DIAGNOSIS — Z20822 Contact with and (suspected) exposure to covid-19: Secondary | ICD-10-CM | POA: Diagnosis present

## 2021-10-13 DIAGNOSIS — M109 Gout, unspecified: Secondary | ICD-10-CM | POA: Diagnosis present

## 2021-10-13 DIAGNOSIS — E860 Dehydration: Secondary | ICD-10-CM | POA: Diagnosis present

## 2021-10-13 DIAGNOSIS — Z8249 Family history of ischemic heart disease and other diseases of the circulatory system: Secondary | ICD-10-CM | POA: Diagnosis not present

## 2021-10-13 DIAGNOSIS — Z6841 Body Mass Index (BMI) 40.0 and over, adult: Secondary | ICD-10-CM | POA: Diagnosis not present

## 2021-10-13 DIAGNOSIS — Z79899 Other long term (current) drug therapy: Secondary | ICD-10-CM

## 2021-10-13 DIAGNOSIS — E785 Hyperlipidemia, unspecified: Secondary | ICD-10-CM | POA: Diagnosis present

## 2021-10-13 DIAGNOSIS — K8000 Calculus of gallbladder with acute cholecystitis without obstruction: Secondary | ICD-10-CM | POA: Diagnosis present

## 2021-10-13 DIAGNOSIS — I159 Secondary hypertension, unspecified: Secondary | ICD-10-CM | POA: Diagnosis present

## 2021-10-13 DIAGNOSIS — N179 Acute kidney failure, unspecified: Secondary | ICD-10-CM | POA: Diagnosis present

## 2021-10-13 DIAGNOSIS — R6520 Severe sepsis without septic shock: Secondary | ICD-10-CM

## 2021-10-13 DIAGNOSIS — K819 Cholecystitis, unspecified: Secondary | ICD-10-CM | POA: Diagnosis not present

## 2021-10-13 DIAGNOSIS — Z7982 Long term (current) use of aspirin: Secondary | ICD-10-CM

## 2021-10-13 DIAGNOSIS — A419 Sepsis, unspecified organism: Principal | ICD-10-CM | POA: Diagnosis present

## 2021-10-13 DIAGNOSIS — E871 Hypo-osmolality and hyponatremia: Secondary | ICD-10-CM | POA: Diagnosis present

## 2021-10-13 DIAGNOSIS — K810 Acute cholecystitis: Secondary | ICD-10-CM

## 2021-10-13 DIAGNOSIS — R1011 Right upper quadrant pain: Secondary | ICD-10-CM

## 2021-10-13 DIAGNOSIS — E876 Hypokalemia: Secondary | ICD-10-CM | POA: Diagnosis present

## 2021-10-13 DIAGNOSIS — N183 Chronic kidney disease, stage 3 unspecified: Secondary | ICD-10-CM | POA: Diagnosis present

## 2021-10-13 LAB — COMPREHENSIVE METABOLIC PANEL
ALT: 39 U/L (ref 0–44)
AST: 46 U/L — ABNORMAL HIGH (ref 15–41)
Albumin: 3.6 g/dL (ref 3.5–5.0)
Alkaline Phosphatase: 77 U/L (ref 38–126)
Anion gap: 14 (ref 5–15)
BUN: 57 mg/dL — ABNORMAL HIGH (ref 6–20)
CO2: 24 mmol/L (ref 22–32)
Calcium: 9.1 mg/dL (ref 8.9–10.3)
Chloride: 92 mmol/L — ABNORMAL LOW (ref 98–111)
Creatinine, Ser: 3.06 mg/dL — ABNORMAL HIGH (ref 0.61–1.24)
GFR, Estimated: 23 mL/min — ABNORMAL LOW (ref >60)
Glucose, Bld: 163 mg/dL — ABNORMAL HIGH (ref 70–99)
Potassium: 4.5 mmol/L (ref 3.5–5.1)
Sodium: 130 mmol/L — ABNORMAL LOW (ref 135–145)
Total Bilirubin: 0.9 mg/dL (ref 0.3–1.2)
Total Protein: 7.9 g/dL (ref 6.5–8.1)

## 2021-10-13 LAB — PROTIME-INR
INR: 1.1 (ref 0.8–1.2)
Prothrombin Time: 14.1 seconds (ref 11.4–15.2)

## 2021-10-13 LAB — CBC
HCT: 41.4 % (ref 39.0–52.0)
Hemoglobin: 14.3 g/dL (ref 13.0–17.0)
MCH: 29.9 pg (ref 26.0–34.0)
MCHC: 34.5 g/dL (ref 30.0–36.0)
MCV: 86.6 fL (ref 80.0–100.0)
Platelets: 219 10*3/uL (ref 150–400)
RBC: 4.78 MIL/uL (ref 4.22–5.81)
RDW: 12.9 % (ref 11.5–15.5)
WBC: 15.8 10*3/uL — ABNORMAL HIGH (ref 4.0–10.5)
nRBC: 0 % (ref 0.0–0.2)

## 2021-10-13 LAB — URINALYSIS, ROUTINE W REFLEX MICROSCOPIC
Bilirubin Urine: NEGATIVE
Glucose, UA: NEGATIVE mg/dL
Ketones, ur: NEGATIVE mg/dL
Leukocytes,Ua: NEGATIVE
Nitrite: NEGATIVE
Protein, ur: 100 mg/dL — AB
Specific Gravity, Urine: 1.02 (ref 1.005–1.030)
pH: 5.5 (ref 5.0–8.0)

## 2021-10-13 LAB — LACTIC ACID, PLASMA
Lactic Acid, Venous: 1.3 mmol/L (ref 0.5–1.9)
Lactic Acid, Venous: 1.1 mmol/L (ref 0.5–1.9)

## 2021-10-13 LAB — RESP PANEL BY RT-PCR (FLU A&B, COVID) ARPGX2
Influenza A by PCR: NEGATIVE
Influenza B by PCR: NEGATIVE
SARS Coronavirus 2 by RT PCR: NEGATIVE

## 2021-10-13 LAB — CK: Total CK: 300 U/L (ref 49–397)

## 2021-10-13 LAB — LIPASE, BLOOD: Lipase: 16 U/L (ref 11–51)

## 2021-10-13 MED ORDER — FENTANYL CITRATE PF 50 MCG/ML IJ SOSY: 50.0000 ug | PREFILLED_SYRINGE | INTRAMUSCULAR | Status: DC | PRN

## 2021-10-13 MED ORDER — ACETAMINOPHEN 325 MG PO TABS
650.0000 mg | ORAL_TABLET | Freq: Four times a day (QID) | ORAL | Status: DC | PRN
  Administered 2021-10-13 – 2021-10-14 (×3): 650 mg via ORAL
  Filled 2021-10-13 (×3): qty 2

## 2021-10-13 MED ORDER — SODIUM CHLORIDE 0.9 % IV SOLN: 2.0000 g | Freq: Once | INTRAVENOUS | Status: DC

## 2021-10-13 MED ORDER — ONDANSETRON HCL 4 MG PO TABS: 4.0000 mg | ORAL_TABLET | Freq: Four times a day (QID) | ORAL | Status: DC | PRN

## 2021-10-13 MED ORDER — FENTANYL CITRATE PF 50 MCG/ML IJ SOSY
50.0000 ug | PREFILLED_SYRINGE | INTRAMUSCULAR | Status: DC | PRN
  Administered 2021-10-13: 50 ug via INTRAVENOUS
  Filled 2021-10-13: qty 1

## 2021-10-13 MED ORDER — METRONIDAZOLE 500 MG/100ML IV SOLN
500.0000 mg | Freq: Once | INTRAVENOUS | Status: AC
  Administered 2021-10-13: 500 mg via INTRAVENOUS
  Filled 2021-10-13: qty 100

## 2021-10-13 MED ORDER — SODIUM CHLORIDE 0.9 % IV SOLN
2.0000 g | INTRAVENOUS | Status: DC
  Administered 2021-10-14: 2 g via INTRAVENOUS
  Filled 2021-10-13 (×2): qty 20

## 2021-10-13 MED ORDER — METRONIDAZOLE 500 MG/100ML IV SOLN
500.0000 mg | Freq: Two times a day (BID) | INTRAVENOUS | Status: DC
  Administered 2021-10-14 – 2021-10-15 (×3): 500 mg via INTRAVENOUS
  Filled 2021-10-13 (×3): qty 100

## 2021-10-13 MED ORDER — ACETAMINOPHEN 650 MG RE SUPP: 650.0000 mg | Freq: Four times a day (QID) | RECTAL | Status: DC | PRN

## 2021-10-13 MED ORDER — LACTATED RINGERS IV BOLUS
2000.0000 mL | Freq: Once | INTRAVENOUS | Status: AC
  Administered 2021-10-13: 2000 mL via INTRAVENOUS

## 2021-10-13 MED ORDER — HYDROCODONE-ACETAMINOPHEN 5-325 MG PO TABS: 1.0000 | ORAL_TABLET | ORAL | Status: DC | PRN

## 2021-10-13 MED ORDER — ONDANSETRON HCL 4 MG/2ML IJ SOLN
4.0000 mg | Freq: Four times a day (QID) | INTRAMUSCULAR | Status: DC | PRN
  Administered 2021-10-13: 4 mg via INTRAVENOUS
  Filled 2021-10-13: qty 2

## 2021-10-13 MED ORDER — LACTATED RINGERS IV SOLN: INTRAVENOUS | Status: AC

## 2021-10-13 MED ORDER — SODIUM CHLORIDE 0.9 % IV SOLN
2.0000 g | Freq: Once | INTRAVENOUS | Status: DC
  Filled 2021-10-13: qty 20

## 2021-10-13 MED ORDER — ONDANSETRON HCL 4 MG/2ML IJ SOLN
4.0000 mg | Freq: Once | INTRAMUSCULAR | Status: AC
  Administered 2021-10-13: 4 mg via INTRAVENOUS
  Filled 2021-10-13: qty 2

## 2021-10-13 NOTE — Plan of Care (Signed)
[facility] will assume care on arrival to accepting facility. Until arrival, care as per EDP. However, [facility] available 24/7 for questions and assistance. ° °Nursing staff, please page [facility] Admits and Consults ([phone]) as soon as the patient arrives the hospital.   °

## 2021-10-13 NOTE — ED Triage Notes (Signed)
He cites lifting many heavy boxes of building materials Sat. (Five days ago). He is here today with c/o feeling a "tearing" sensation at mid and upper abd. Areas; followed by n/v/d ~ 10 episodes per day.

## 2021-10-13 NOTE — Sepsis Progress Note (Addendum)
Elink following Code Sepsis   Rocephin started briefly but was stopped and blood cultures were drawn

## 2021-10-13 NOTE — H&P (Signed)
Bobby Ortiz YCX:[id number] DOB: [birth year] DOA: 10/13/2021     PCP: Sharion Balloon, FNP   Outpatient Specialists:  NONE    Patient arrived to ER on 10/13/21 at 1543 Referred by Attending Shela Leff, MD   Patient coming from: home Lives With family    Chief Complaint:   Chief Complaint  Patient presents with   Abdominal Pain    HPI: Bobby Ortiz is a 61 y.o. male with medical history significant of HTN. HLD, gout    Presented with   N/V/D abd pain  4 days of abdominal pain he thought it was from moving heavy tiles Then developed n/V/D up to 10 BMs a day  Emesis is bilious and non-bloody up to 5 a day He thought it was stomach virus but continued to get sicker No sick contacts Nausea now better with zofran Denies fever at home Reports did not get any sleep reports chills No CP No Tobacco no etoh    Has  been vaccinated against COVID  and boosted had    no flu shot   Initial COVID TEST  NEGATIVE   Lab Results  Component Value Date   SARSCOV2NAA NEGATIVE 10/13/2021   [place] Detected (A) 09/02/2019   [place] Detected (A) 08/26/2019     Regarding pertinent Chronic problems:     Hyperlipidemia -  on statins Pravachol Lipid Panel     Component Value Date/Time   CHOL 188 07/07/2020 1624   TRIG 266 (H) 07/07/2020 1624   HDL 34 (L) 07/07/2020 1624   CHOLHDL 5.5 (H) 07/07/2020 1624   LDLCALC 108 (H) 07/07/2020 1624   LABVLDL 46 (H) 07/07/2020 1624     HTN on Lisinopril     Morbid obesity-   BMI Readings from Last 1 Encounters:  08/11/21 42.25 kg/m      CKD stage IIIa- baseline Cr  1.3 CrCl cannot be calculated (Unknown ideal weight.).  Lab Results  Component Value Date   CREATININE 3.06 (H) 10/13/2021   CREATININE 1.27 07/16/2020   CREATININE 1.34 (H) 07/07/2020     While in ER:   Noted to have significant abdominal pain and positive versus time hyponatremic and new AKI 88 CT scan ordered and showed cholelithiasis no  SBO.  High suspicion for symptomatic cholelithiasis. Started on Zofran and fentanyl given IV fluids Discussed with general surgery who would like him to be admitted to medicine but operate in a.m. Ordered    CXR -  NON acute  CTabd/pelvis - Cholelithiasis with acute cholecystitis.    Following Medications were ordered in ER: Medications  lactated ringers infusion ( Intravenous New Bag/Given 10/13/21 1910)  fentaNYL (SUBLIMAZE) injection 50 mcg (50 mcg Intravenous Given 10/13/21 1742)  cefTRIAXone (ROCEPHIN) 2 g in sodium chloride 0.9 % 100 mL IVPB (0 g Intravenous Stopped 10/13/21 2008)  ondansetron (ZOFRAN) injection 4 mg (4 mg Intravenous Given 10/13/21 1741)  lactated ringers bolus 2,000 mL (0 mLs Intravenous Stopped 10/13/21 1905)  metroNIDAZOLE (FLAGYL) IVPB 500 mg (0 mg Intravenous Stopped 10/13/21 2120)    _______________________________________________________ ER Provider Called:  General Surgery   Dr.Shelby They Recommend admit to medicine   Will see in AM      ED Triage Vitals  Enc Vitals Group     BP 10/13/21 1553 101/68     Pulse Rate 10/13/21 1553 95     Resp 10/13/21 1553 (!) 22     Temp 10/13/21 1553 99.3 F (37.4 C)     Temp Source  10/13/21 2212 Oral     SpO2 10/13/21 1553 95 %     Weight --      Height --      Head Circumference --      Peak Flow --      Pain Score 10/13/21 1623 8     Pain Loc --      Pain Edu? --      Excl. in [place]? --   TMAX(24)@     _________________________________________ Significant initial  Findings: Abnormal Labs Reviewed  COMPREHENSIVE METABOLIC PANEL - Abnormal; Notable for the following components:      Result Value   Sodium 130 (*)    Chloride 92 (*)    Glucose, Bld 163 (*)    BUN 57 (*)    Creatinine, Ser 3.06 (*)    AST 46 (*)    GFR, Estimated 23 (*)    All other components within normal limits  CBC - Abnormal; Notable for the following components:   WBC 15.8 (*)    All other components within normal limits   URINALYSIS, ROUTINE W REFLEX MICROSCOPIC - Abnormal; Notable for the following components:   APPearance HAZY (*)    Hgb urine dipstick MODERATE (*)    Protein, ur 100 (*)    All other components within normal limits      ECG: Ordered Personally reviewed by me showing: HR :  88 Rhythm: NSR,   nonspecific changes,   QTC 441    ____________________ This patient meets SIRS Criteria and may be septic.    The recent clinical data is shown below. Vitals:   10/13/21 1945 10/13/21 2030 10/13/21 2115 10/13/21 2212  BP: 117/63 117/68 117/81 (!) 141/88  Pulse: 73 77 91 (!) 105  Resp: (!) 32 (!) _0 Temp:    (!) 100.4 F (38 C)  TempSrc:    Oral  SpO2: 97% 96% 94% 95%     WBC     Component Value Date/Time   WBC 15.8 (H) 10/13/2021 1616   LYMPHSABS 2.3 07/16/2020 1200   MONOABS 0.6 07/23/2014 1535   EOSABS 0.1 07/16/2020 1200   BASOSABS 0.1 07/16/2020 1200    Lactic Acid, Venous    Component Value Date/Time   LATICACIDVEN 1.1 10/13/2021 2130       UA   no evidence of UTI      Urine analysis:    Component Value Date/Time   COLORURINE YELLOW 10/13/2021 1914   APPEARANCEUR HAZY (A) 10/13/2021 1914   LABSPEC 1.020 10/13/2021 1914   PHURINE 5.5 10/13/2021 1914   GLUCOSEU NEGATIVE 10/13/2021 1914   HGBUR MODERATE (A) 10/13/2021 1914   BILIRUBINUR NEGATIVE 10/13/2021 1914   KETONESUR NEGATIVE 10/13/2021 1914   PROTEINUR 100 (A) 10/13/2021 1914   UROBILINOGEN 0.2 07/23/2014 1520   NITRITE NEGATIVE 10/13/2021 1914   LEUKOCYTESUR NEGATIVE 10/13/2021 1914    Results for orders placed or performed during the hospital encounter of 10/13/21  Resp Panel by RT-PCR (Flu A&B, Covid) Nasopharyngeal Swab     Status: None   Collection Time: 10/13/21  5:50 PM   Specimen: Nasopharyngeal Swab; Nasopharyngeal(NP) swabs in vial transport medium  Result Value Ref Range Status   SARS Coronavirus 2 by RT PCR NEGATIVE NEGATIVE Final         Influenza A by PCR NEGATIVE NEGATIVE  Final   Influenza B by PCR NEGATIVE NEGATIVE Final          _______________________________________________ Hospitalist was called for admission  for cholecystitis  The following Work up has been ordered so far:  Orders Placed This Encounter  Procedures   Critical Care   Resp Panel by RT-PCR (Flu A&B, Covid) Nasopharyngeal Swab   Blood Culture (routine x 2)   CT ABDOMEN PELVIS WO CONTRAST   DG Chest Port 1 View   Lipase, blood   Comprehensive metabolic panel   CBC   Urinalysis, Routine w reflex microscopic   CK   Lactic acid, plasma   Protime-INR   Diet NPO time specified   Cardiac monitoring   Document height and weight   Assess and Document Glasgow Coma Scale   Document vital signs within 1-hour of fluid bolus completion. Notify provider of abnormal vital signs despite fluid resuscitation.   DO NOT delay antibiotics if unable to obtain blood culture.   Refer to Sidebar Report: Sepsis Sidebar ED/IP   Notify provider for difficulties obtaining IV access.   Insert peripheral IV x 2   Initiate Carrier Fluid Protocol   Consult to general surgery   Code Sepsis activation.  This occurs automatically when order is signed and prioritizes pharmacy, lab, and radiology services for STAT collections and interventions.  If [facility] downtime, call [facility] [phone]) to activate Code Sepsis.   pharmacy consult   Consult to hospitalist   Pulse oximetry, continuous   EKG 12-Lead   ED EKG 12-Lead   EKG 12-Lead   Admit to Inpatient (patient's expected length of stay will be greater than 2 midnights or inpatient only procedure)     OTHER Significant initial  Findings:  labs showing:    Recent Labs  Lab 10/13/21 1616  NA 130*  K 4.5  CO2 24  GLUCOSE 163*  BUN 57*  CREATININE 3.06*  CALCIUM 9.1    Cr  Up from baseline see below Lab Results  Component Value Date   CREATININE 3.06 (H) 10/13/2021   CREATININE 1.27 07/16/2020   CREATININE 1.34 (H) 07/07/2020    Recent Labs   Lab 10/13/21 1616  AST 46*  ALT 39  ALKPHOS 77  BILITOT 0.9  PROT 7.9  ALBUMIN 3.6   Lab Results  Component Value Date   CALCIUM 9.1 10/13/2021    Plt: Lab Results  Component Value Date   PLT 219 10/13/2021     COVID-19 Labs  No results for input(s): DDIMER, FERRITIN, LDH, CRP in the last 72 hours.  Lab Results  Component Value Date   SARSCOV2NAA NEGATIVE 10/13/2021   SARSCOV2NAA Detected (A) 09/02/2019   SARSCOV2NAA Detected (A) 08/26/2019        Recent Labs  Lab 10/13/21 1616  WBC 15.8*  HGB 14.3  HCT 41.4  MCV 86.6  PLT 219    HG/HCT  stable,       Component Value Date/Time   HGB 14.3 10/13/2021 1616   HGB 15.2 07/16/2020 1200   HCT 41.4 10/13/2021 1616   HCT 43.4 07/16/2020 1200   MCV 86.6 10/13/2021 1616   MCV 92 07/16/2020 1200    Recent Labs  Lab 10/13/21 1616  LIPASE 16   No results for input(s): AMMONIA in the last 168 hours.    Cardiac Panel (last 3 results) Recent Labs    10/13/21 1750  CKTOTAL 300    .car BNP (last 3 results) No results for input(s): BNP in the last 8760 hours.      Cultures:    Component Value Date/Time   SDES ABSCESS RIGHT GROIN 07/25/2014 1240   SDES ABSCESS RIGHT  GROIN 07/25/2014 1240   SPECREQUEST POF UNASYN AND VANCOMYCIN 07/25/2014 1240   SPECREQUEST POF UNASYN AND VANCOMYCIN 07/25/2014 1240   CULT  07/25/2014 1240    ABUNDANT STAPHYLOCOCCUS AUREUS Note: RIFAMPIN AND GENTAMICIN SHOULD NOT BE USED AS SINGLE DRUGS FOR TREATMENT OF STAPH INFECTIONS. Performed at [facility]  07/25/2014 [place] Performed at [facility] 07/28/2014 FINAL 07/25/2014 1240   REPTSTATUS 07/30/2014 FINAL 07/25/2014 1240     Radiological Exams on Admission: CT ABDOMEN PELVIS WO CONTRAST  Result Date: 10/13/2021 CLINICAL DATA:  Bowel obstruction suspected EXAM: CT ABDOMEN AND PELVIS WITHOUT CONTRAST TECHNIQUE: Multidetector CT imaging of the abdomen and  pelvis was performed following the standard protocol without IV contrast. RADIATION DOSE REDUCTION: This exam was performed according to the departmental dose-optimization program which includes automated exposure control, adjustment of the mA and/or kV according to patient size and/or use of iterative reconstruction technique. COMPARISON:  None. FINDINGS: Lower chest: No acute abnormality. Hepatobiliary: No focal liver abnormality. Gallbladder wall thickening and pericholecystic fluid. Layering hyperdensity within gallbladder lumen may represent tiny gallstones versus gallbladder sludge. No biliary dilatation. Pancreas: No focal lesion. Normal pancreatic contour. No surrounding inflammatory changes. No main pancreatic ductal dilatation. Spleen: Normal in size without focal abnormality. Adrenals/Urinary Tract: No adrenal nodule bilaterally. No nephrolithiasis and no hydronephrosis. Parapelvic 4.1 x 3.5 cm fluid density lesion likely representing a parapelvic cyst. No ureterolithiasis or hydroureter. The urinary bladder is unremarkable. Stomach/Bowel: Stomach is within normal limits. Vague reactive changes of the duodenum. Otherwise no evidence of bowel wall thickening or dilatation. Status post appendectomy. Vascular/Lymphatic: No abdominal aorta or iliac aneurysm. Mild atherosclerotic plaque of the aorta and its branches. No abdominal, pelvic, or inguinal lymphadenopathy. Reproductive: Prostate is unremarkable. Other: No intraperitoneal free fluid. No intraperitoneal free gas. No organized fluid collection. Musculoskeletal: No abdominal wall hernia or abnormality. No suspicious lytic or blastic osseous lesions. No acute displaced fracture. Multilevel degenerative changes of the spine. IMPRESSION: Cholelithiasis with acute cholecystitis. Electronically Signed   By: Iven Finn M.D.   On: 10/13/2021 18:33   DG Chest Port 1 View  Result Date: 10/13/2021 CLINICAL DATA:  Questionable sepsis. EXAM: PORTABLE CHEST  1 VIEW COMPARISON:  CT abdomen and pelvis 10/13/2021. FINDINGS: The heart size and mediastinal contours are within normal limits. Both lungs are clear. The visualized skeletal structures are unremarkable. IMPRESSION: No active disease. Electronically Signed   By: Ronney Asters M.D.   On: 10/13/2021 19:24   _______________________________________________________________________________________________________ Latest  Blood pressure (!) 141/88, pulse (!) 105, temperature (!) 100.4 F (38 C), temperature source Oral, resp. rate 18, SpO2 95 %.   Vitals  labs and radiology finding personally reviewed  Review of Systems:    Pertinent positives include:   Fevers, chills, fatigue,  abdominal pain, nausea, vomiting, diarrhea   Constitutional:  No weight loss, night sweats weight loss  HEENT:  No headaches, Difficulty swallowing,Tooth/dental problems,Sore throat,  No sneezing, itching, ear ache, nasal congestion, post nasal drip,  Cardio-vascular:  No chest pain, Orthopnea, PND, anasarca, dizziness, palpitations.no Bilateral lower extremity swelling  GI:  No heartburn, indigestion,, change in bowel habits, loss of appetite, melena, blood in stool, hematemesis Resp:  no shortness of breath at rest. No dyspnea on exertion, No excess mucus, no productive cough, No non-productive cough, No coughing up of blood.No change in color of mucus.No wheezing. Skin:  no rash or lesions. No jaundice GU:  no  dysuria, change in color of urine, no urgency or frequency. No straining to urinate.  No flank pain.  Musculoskeletal:  No joint pain or no joint swelling. No decreased range of motion. No back pain.  Psych:  No change in mood or affect. No depression or anxiety. No memory loss.  Neuro: no localizing neurological complaints, no tingling, no weakness, no double vision, no gait abnormality, no slurred speech, no confusion  All systems reviewed and apart from [place] all are  negative _______________________________________________________________________________________________ Past Medical History:   Past Medical History:  Diagnosis Date   Hypertension       Past Surgical History:  Procedure Laterality Date   APPENDECTOMY     IRRIGATION AND DEBRIDEMENT ABSCESS Right 07/25/2014   Procedure: IRRIGATION AND DEBRIDEMENT ABSCESS RIGHT GROIN;  Surgeon: Claybon Jabs, MD;  Location: [facility];  Service: Urology;  Laterality: Right;    Social History:  Ambulatory   independently       reports that he has never smoked. He has never used smokeless tobacco. He reports that he does not drink alcohol and does not use drugs.     Family History:   Family History  Problem Relation Age of Onset   Heart disease Mother        Stents   Cancer Mother        Stomach   Heart disease Father        Heart attack   ______________________________________________________________________________________________ Allergies: No Known Allergies   Prior to Admission medications   Medication Sig Start Date End Date Taking? Authorizing Provider  albuterol (VENTOLIN HFA) 108 (90 Base) MCG/ACT inhaler Inhale 2 puffs into the lungs every 6 (six) hours as needed for wheezing or shortness of breath. Patient not taking: Reported on 08/11/2021 04/12/21   Evelina Dun A, FNP  allopurinol (ZYLOPRIM) 100 MG tablet TAKE ONE (1) TABLET EACH DAY 08/11/21   Evelina Dun A, FNP  amLODipine (NORVASC) 5 MG tablet TAKE ONE (1) TABLET BY MOUTH EVERY DAY 10/08/21   Evelina Dun A, FNP  aspirin EC 81 MG tablet Take 81 mg by mouth daily.    [provider]  colchicine 0.6 MG tablet Take twice daily for gout attack. (may take every two hours up to 6 doses at acute onset) 08/11/21   Sharion Balloon, FNP  ibuprofen (ADVIL,MOTRIN) 200 MG tablet Take 400-600 mg by mouth every 6 (six) hours as needed.    [provider]  lisinopril (ZESTRIL) 40 MG tablet Take 1 tablet (40 mg  total) by mouth daily. 08/11/21   Sharion Balloon, FNP  pravastatin (PRAVACHOL) 80 MG tablet Take 1 tablet (80 mg total) by mouth daily. 08/11/21   Sharion Balloon, FNP    ___________________________________________________________________________________________________ Physical Exam: Vitals with BMI 10/13/2021 10/13/2021 10/13/2021  Height - - -  Weight - - -  BMI - - -  Systolic 086 578 469  Diastolic 88 81 68  Pulse 629 91 77     1. General:  in No  Acute distress   acutely ill -appearing 2. Psychological: Alert and   Oriented 3. Head/ENT:   Dry Mucous Membranes                          Head Non traumatic, neck supple                           Poor Dentition 4. SKIN:  decreased Skin turgor,  Skin clean Dry and intact no rash 5. Heart: Regular rate and rhythm no  Murmur, no Rub or gallop 6. Lungs:  Clear to auscultation bilaterally, no wheezes or crackles   7. Abdomen: Soft, mild epigastric tender, Non distended   obese  bowel sounds present 8. Lower extremities: no clubbing, cyanosis, no  edema 9. Neurologically Grossly intact, moving all 4 extremities equally   10. MSK: Normal range of motion    Chart has been reviewed  ______________________________________________________________________________________________  Assessment/Plan 61 y.o. male with medical history significant of HTN. HLD, gout   Admitted for acute cholecystitis  Present on Admission:  Cholecystitis  Morbid obesity ([place])  Secondary hypertension  Hyperlipemia  AKI (acute kidney injury) ([place])  Hyponatremia  Dehydration     Secondary hypertension Allow permissive HTn  Hyperlipemia Resume home meds when able to tolerate  Cholecystitis Appreciate general surgery consult continue ceftriaxone and Flagyl.  N.p.o. postmidnight.  Pain management as needed Anticipate surgical intervention tomorrow  Morbid obesity ([place]) Chronic continue will need to follow-up as an outpatient with nutrition  AKI  (acute kidney injury) ([place]) Likely in the setting of dehydration will rehydrate and follow renal function Obtain urine electrolytes  Hyponatremia In the setting of dehydration obtain urine electrolytes rehydrate and follow serial BMet  Dehydration Rehydrate and follow fluid status  Sepsis (HCC)  -SIRS criteria met with  elevated white blood cell count,       Component Value Date/Time   WBC 15.8 (H) 10/13/2021 1616   LYMPHSABS 2.3 07/16/2020 1200     tachycardia    Fever  RR >20 Today's Vitals   10/13/21 1945 10/13/21 2030 10/13/21 2115 10/13/21 2212  BP: 117/63 117/68 117/81 (!) 141/88  Pulse: 73 77 91 (!) 105  Resp: (!) 32 (!) _0 Temp:    (!) 100.4 F (38 C)  TempSrc:    Oral  SpO2: 97% 96% 94% 95%  PainSc:    0-No pain    -Most likely source being: intra-abdominal,   Patient meeting criteria for Severe sepsis with    evidence of end organ damage/organ dysfunction such as  Acute Kidney Injury with Cr > 2,  Lab Results  Component Value Date   CREATININE 3.06 (H) 10/13/2021   CREATININE 1.27 07/16/2020      - Obtain serial lactic acid and procalcitonin level.  - Initiated IV antibiotics in ER: Antibiotics Given (last 72 hours)     Date/Time Action Medication Dose Rate   10/13/21 2013 New Bag/Given   metroNIDAZOLE (FLAGYL) IVPB 500 mg 500 mg 100 mL/hr       Will continue  on ceftriaxone and Flagyl   - await results of blood and urine culture  - Rehydrate aggressively  Intravenous fluids were administered     12:11 AM   CKD (chronic kidney disease), stage [name] (HCC)  -chronic avoid nephrotoxic medications such as NSAIDs, Vanco Zosyn combo,  avoid hypotension, continue to follow renal function    Other plan as per orders.  DVT prophylaxis:  SCD      Code Status:    Code Status: Prior FULL CODE as per patient   I had personally discussed CODE STATUS with patient     Family Communication:   Family not at  Bedside    Disposition Plan:       To home once workup is complete and patient is stable   Following barriers for discharge:  Electrolytes corrected                                                           Pain controlled with PO medications                               Afebrile, white count improving able to transition to PO antibiotics                             Will need to be able to tolerate PO                                                     Will need consultants to evaluate patient prior to discharge    Consults called:  General surgery is aware    Admission status:  ED Disposition     ED Disposition  [place]: [facility] [[id number]]  Level of Care: Med-Surg [16]  May admit patient to [facility] or [facility] if equivalent level of care is available:: Yes  Interfacility transfer: Yes  Covid Evaluation: Asymptomatic Screening Protocol (No Symptoms)  Diagnosis: Cholecystitis [888280]  Admitting Physician: Shela Leff [[id number]]  Attending Physician: Shela Leff [[id number]]  Estimated length of stay: past midnight tomorrow  Certification:: I certify this patient will need inpatient services for at least 2 midnights          inpatient     I Expect 2 midnight stay secondary to severity of patient's current illness need for inpatient interventions justified by the following:  hemodynamic instability despite optimal treatment (tachycardia  )  Severe lab/radiological/exam abnormalities including:    cholecystitis and extensive comorbidities including:  CKD    That are currently affecting medical management.   I expect  patient to be hospitalized for 2 midnights requiring inpatient medical care.  Patient is at high risk for adverse outcome (such as loss of life or disability) if not treated.  Indication for inpatient stay as follows:    severe pain requiring acute inpatient management,  inability to  maintain oral hydration    Need for operative/procedural  intervention     Need for IV antibiotics, IV fluids, IV pain medications, IV anticoagulation,      Level of care       medical floor        Lab Results  Component Value Date   [place] 10/13/2021     Precautions: admitted as   Covid Negative     Bobby Ortiz 10/14/2021, 2:35 AM    [facility]     after 2 AM please page floor coverage PA If 7AM-7PM, please contact the day team taking care of the patient using [email or web address]   Patient was evaluated in the context of the global COVID-19 pandemic, which necessitated consideration that the patient might be at risk for infection with the SARS-CoV-2 virus that causes COVID-19. Institutional protocols and algorithms that pertain to the evaluation of patients at risk for COVID-19 are  in a state of rapid change based on information released by regulatory bodies including the CDC and federal and state organizations. These policies and algorithms were followed during the patient's care.

## 2021-10-13 NOTE — Assessment & Plan Note (Signed)
Allow permissive HTn  °

## 2021-10-13 NOTE — ED Notes (Signed)
Called [facility] to transport patient to [facility] 3E room 1303 

## 2021-10-13 NOTE — Subjective & Objective (Signed)
4 days of abdominal pain he thought it was from moving heavy tiles Then developed n/V/D up to 10 BMs a day  Emesis is bilious and non-bloody up to 5 a day

## 2021-10-13 NOTE — ED Provider Notes (Signed)
[facility] EMERGENCY DEPT Provider Note   CSN: [id number] Arrival date & time: 10/13/21  1543     History  Chief Complaint  Patient presents with   Abdominal Pain    Bobby Ortiz is a 61 y.o. male.  HPI    61 year old male comes in with chief complaint of abdominal pain.  Patient has history of hypertension. He reports that about 4 days ago he was moving heavy tiles.  Thereafter he started having lower quadrant abdominal pain, nausea, vomiting and diarrhea.  His pain is generalized, worse when he is retching or moving.  Patient has had about 5 episodes of emesis, with severe nausea that is persistent.  Emesis is bilious and nonbloody.  Patient also reports more than 10 loose watery BM.  He has history of appendectomy.  No history of SBO.  No association of pain to food.   Home Medications Prior to Admission medications   Medication Sig Start Date End Date Taking? Authorizing Provider  albuterol (VENTOLIN HFA) 108 (90 Base) MCG/ACT inhaler Inhale 2 puffs into the lungs every 6 (six) hours as needed for wheezing or shortness of breath. Patient not taking: Reported on 08/11/2021 04/12/21   Jannifer Rodney A, FNP  allopurinol (ZYLOPRIM) 100 MG tablet TAKE ONE (1) TABLET EACH DAY 08/11/21   Jannifer Rodney A, FNP  amLODipine (NORVASC) 5 MG tablet TAKE ONE (1) TABLET BY MOUTH EVERY DAY 10/08/21   Jannifer Rodney A, FNP  aspirin EC 81 MG tablet Take 81 mg by mouth daily.    [provider]  colchicine 0.6 MG tablet Take twice daily for gout attack. (may take every two hours up to 6 doses at acute onset) 08/11/21   Junie Spencer, FNP  ibuprofen (ADVIL,MOTRIN) 200 MG tablet Take 400-600 mg by mouth every 6 (six) hours as needed.    [provider]  lisinopril (ZESTRIL) 40 MG tablet Take 1 tablet (40 mg total) by mouth daily. 08/11/21   Junie Spencer, FNP  pravastatin (PRAVACHOL) 80 MG tablet Take 1 tablet (80 mg total) by mouth daily. 08/11/21   Junie Spencer, FNP      Allergies    Patient has no known allergies.    Review of Systems   Review of Systems  Constitutional:  Positive for activity change.  Gastrointestinal:  Positive for abdominal pain, diarrhea, nausea and vomiting.   Physical Exam Updated Vital Signs BP 107/76 (BP Location: Left Arm)    Pulse 73    Temp 99.3 F (37.4 C)    Resp 16    SpO2 96%  Physical Exam Vitals and nursing note reviewed.  Constitutional:      Appearance: He is well-developed.  HENT:     Head: Atraumatic.  Cardiovascular:     Rate and Rhythm: Normal rate.  Pulmonary:     Effort: Pulmonary effort is normal.  Abdominal:     Tenderness: There is abdominal tenderness in the right upper quadrant, right lower quadrant, epigastric area and periumbilical area. There is guarding and rebound. Positive signs include Murphy's sign.  Musculoskeletal:     Cervical back: Neck supple.  Skin:    General: Skin is warm.  Neurological:     Mental Status: He is alert and oriented to person, place, and time.    ED Results / Procedures / Treatments   Labs (all labs ordered are listed, but only abnormal results are displayed) Labs Reviewed  COMPREHENSIVE METABOLIC PANEL - Abnormal; Notable for the following components:  Result Value   Sodium 130 (*)    Chloride 92 (*)    Glucose, Bld 163 (*)    BUN 57 (*)    Creatinine, Ser 3.06 (*)    AST 46 (*)    GFR, Estimated 23 (*)    All other components within normal limits  CBC - Abnormal; Notable for the following components:   WBC 15.8 (*)    All other components within normal limits  RESP PANEL BY RT-PCR (FLU A&B, COVID) ARPGX2  CULTURE, BLOOD (ROUTINE X 2)  CULTURE, BLOOD (ROUTINE X 2)  LIPASE, BLOOD  CK  URINALYSIS, ROUTINE W REFLEX MICROSCOPIC  LACTIC ACID, PLASMA  LACTIC ACID, PLASMA  PROTIME-INR    EKG EKG Interpretation  Date/Time:  Wednesday October 13 2021 15:59:52 EST Ventricular Rate:  88 PR Interval:  192 QRS  Duration: 107 QT Interval:  364 QTC Calculation: 441 R Axis:   -89 Text Interpretation: Sinus rhythm Incomplete RBBB and LAFB Consider anterior infarct No acute changes No old tracing to compare Confirmed by Derwood Kaplan [phone]) on 10/13/2021 6:48:44 PM  Radiology CT ABDOMEN PELVIS WO CONTRAST  Result Date: 10/13/2021 CLINICAL DATA:  Bowel obstruction suspected EXAM: CT ABDOMEN AND PELVIS WITHOUT CONTRAST TECHNIQUE: Multidetector CT imaging of the abdomen and pelvis was performed following the standard protocol without IV contrast. RADIATION DOSE REDUCTION: This exam was performed according to the departmental dose-optimization program which includes automated exposure control, adjustment of the mA and/or kV according to patient size and/or use of iterative reconstruction technique. COMPARISON:  None. FINDINGS: Lower chest: No acute abnormality. Hepatobiliary: No focal liver abnormality. Gallbladder wall thickening and pericholecystic fluid. Layering hyperdensity within gallbladder lumen may represent tiny gallstones versus gallbladder sludge. No biliary dilatation. Pancreas: No focal lesion. Normal pancreatic contour. No surrounding inflammatory changes. No main pancreatic ductal dilatation. Spleen: Normal in size without focal abnormality. Adrenals/Urinary Tract: No adrenal nodule bilaterally. No nephrolithiasis and no hydronephrosis. Parapelvic 4.1 x 3.5 cm fluid density lesion likely representing a parapelvic cyst. No ureterolithiasis or hydroureter. The urinary bladder is unremarkable. Stomach/Bowel: Stomach is within normal limits. Vague reactive changes of the duodenum. Otherwise no evidence of bowel wall thickening or dilatation. Status post appendectomy. Vascular/Lymphatic: No abdominal aorta or iliac aneurysm. Mild atherosclerotic plaque of the aorta and its branches. No abdominal, pelvic, or inguinal lymphadenopathy. Reproductive: Prostate is unremarkable. Other: No intraperitoneal free fluid.  No intraperitoneal free gas. No organized fluid collection. Musculoskeletal: No abdominal wall hernia or abnormality. No suspicious lytic or blastic osseous lesions. No acute displaced fracture. Multilevel degenerative changes of the spine. IMPRESSION: Cholelithiasis with acute cholecystitis. Electronically Signed   By: Tish Frederickson M.D.   On: 10/13/2021 18:33   DG Chest Port 1 View  Result Date: 10/13/2021 CLINICAL DATA:  Questionable sepsis. EXAM: PORTABLE CHEST 1 VIEW COMPARISON:  CT abdomen and pelvis 10/13/2021. FINDINGS: The heart size and mediastinal contours are within normal limits. Both lungs are clear. The visualized skeletal structures are unremarkable. IMPRESSION: No active disease. Electronically Signed   By: Darliss Cheney M.D.   On: 10/13/2021 19:24    Procedures .Critical Care Performed by: Derwood Kaplan, MD Authorized by: Derwood Kaplan, MD   Critical care provider statement:    Critical care time (minutes):  65   Critical care was necessary to treat or prevent imminent or life-threatening deterioration of the following conditions:  Renal failure, circulatory failure and sepsis   Critical care was time spent personally by me on the following activities:  Development  of treatment plan with patient or surrogate, discussions with consultants, evaluation of patient's response to treatment, examination of patient, ordering and review of laboratory studies, ordering and review of radiographic studies, ordering and performing treatments and interventions, pulse oximetry, re-evaluation of patient's condition and review of old charts   I assumed direction of critical care for this patient from another provider in my specialty: yes      Medications Ordered in ED Medications  lactated ringers infusion ( Intravenous New Bag/Given 10/13/21 1910)  fentaNYL (SUBLIMAZE) injection 50 mcg (50 mcg Intravenous Given 10/13/21 1742)  cefTRIAXone (ROCEPHIN) 2 g in sodium chloride 0.9 % 100 mL  IVPB (2 g Intravenous New Bag/Given 10/13/21 1913)  metroNIDAZOLE (FLAGYL) IVPB 500 mg (has no administration in time range)  ondansetron (ZOFRAN) injection 4 mg (4 mg Intravenous Given 10/13/21 1741)  lactated ringers bolus 2,000 mL (0 mLs Intravenous Stopped 10/13/21 1905)    ED Course/ Medical Decision Making/ A&P                           Medical Decision Making This patient presents to the ED with chief complaint(s) of abdominal pain, nausea, vomiting and diarrhea with pertinent past medical history of appendectomy, and moving heavy load prior to the symptoms onset, which further complicates the presenting complaint. The complaint involves an extensive differential diagnosis and treatment options and also carries with it a high risk of complications and morbidity.    On exam patient is noted to have significant right-sided abdominal discomfort with positive Murphy sign.  The differential diagnosis includes: Gastroenteritis, cholelithiasis, cholecystitis, pancreatitis, peptic ulcer disease, renal failure, electrolyte abnormality, tumor  The initial plan is to get basic labs.  CT scan ordered to ensure there is no small bowel obstruction.   Additional history obtained: Additional history obtained from spouse  Reassessment and review: Lab Tests: I Ordered, and personally interpreted labs.  The pertinent results include: Patient has acute renal failure with creatinine over 3.  He also has elevated white count.  Imaging Studies ordered: I independently visualized and interpreted the following imaging CT scan of the abdomen without contrast which showed cholelithiasis.  No evidence of small bowel obstruction. I agree with the radiologist interpretation.  I repeated the abdominal exam, patient continues to have Murphy sign, there is high suspicion for symptomatic cholelithiasis.   Cardiac Monitoring: The patient was maintained on a cardiac monitor.  I personally viewed and interpreted the  cardiac monitor which showed an underlying rhythm of:  sinus rhythm  Medicines ordered and prescription drug management: I ordered the following medications IV Zofran and fentanyl and also IV antibiotic for pain and nausea control and initial treatment of cholecystitis. IV fluids ordered for acute renal failure.   Reevaluation of the patient after these medicines showed that the patient    improved  Consultations Obtained: I requested consultation with the admitting physician for admission for acute renal failure and symptomatic cholelithiasis and consultant with general surgery, Dr. Mitzi DavenportShelby has reviewed the CT scan and indicates that she is concerned that patient might have acute cholecystitis.  General surgery recommends that patient be admitted to medicine, and they would like to operate on him tomorrow.  Complexity of problems addressed: Patient's presentation is most consistent with  acute presentation with potential threat to life or bodily function     During patient's assessment  Disposition: After consideration of the diagnostic results and the patient's response to treatment,  Problems Addressed: Acute cholecystitis: acute illness or injury with systemic symptoms that poses a threat to life or bodily functions AKI (acute kidney injury) [facility](HCC): acute illness or injury that poses a threat to life or bodily functions Severe sepsis [facility](HCC): acute illness or injury with systemic symptoms that poses a threat to life or bodily functions  Amount and/or Complexity of Data Reviewed Independent Historian: spouse External Data Reviewed: notes. Labs: ordered. Decision-making details documented in ED Course. Radiology: ordered and independent interpretation performed. Decision-making details documented in ED Course. ECG/medicine tests: ordered.  Risk Prescription drug management. Parenteral controlled substances. Decision regarding hospitalization. Emergency major  surgery.          Final Clinical Impression(s) / ED Diagnoses Final diagnoses:  Acute cholecystitis  Severe sepsis (HCC)  AKI (acute kidney injury) [facility](HCC)    Rx / DC Orders ED Discharge Orders     None         Derwood KaplanNanavati, Taige Housman, MD 10/13/21 1940

## 2021-10-13 NOTE — Assessment & Plan Note (Signed)
Resume home meds when able to tolerate

## 2021-10-14 ENCOUNTER — Inpatient Hospital Stay (HOSPITAL_COMMUNITY): Payer: Commercial Managed Care - PPO

## 2021-10-14 DIAGNOSIS — N183 Chronic kidney disease, stage 3 unspecified: Secondary | ICD-10-CM | POA: Diagnosis present

## 2021-10-14 HISTORY — PX: IR PERC CHOLECYSTOSTOMY: IMG2326

## 2021-10-14 LAB — COMPREHENSIVE METABOLIC PANEL
ALT: 42 U/L (ref 0–44)
ALT: 41 U/L (ref 0–44)
AST: 40 U/L (ref 15–41)
AST: 39 U/L (ref 15–41)
Albumin: 3.2 g/dL — ABNORMAL LOW (ref 3.5–5.0)
Albumin: 3.1 g/dL — ABNORMAL LOW (ref 3.5–5.0)
Alkaline Phosphatase: 78 U/L (ref 38–126)
Alkaline Phosphatase: 79 U/L (ref 38–126)
Anion gap: 15 (ref 5–15)
Anion gap: 12 (ref 5–15)
BUN: 53 mg/dL — ABNORMAL HIGH (ref 6–20)
BUN: 50 mg/dL — ABNORMAL HIGH (ref 6–20)
CO2: 23 mmol/L (ref 22–32)
CO2: 23 mmol/L (ref 22–32)
Calcium: 8.3 mg/dL — ABNORMAL LOW (ref 8.9–10.3)
Calcium: 8.2 mg/dL — ABNORMAL LOW (ref 8.9–10.3)
Chloride: 93 mmol/L — ABNORMAL LOW (ref 98–111)
Chloride: 95 mmol/L — ABNORMAL LOW (ref 98–111)
Creatinine, Ser: 2.66 mg/dL — ABNORMAL HIGH (ref 0.61–1.24)
Creatinine, Ser: 2.1 mg/dL — ABNORMAL HIGH (ref 0.61–1.24)
GFR, Estimated: 27 mL/min — ABNORMAL LOW (ref >60)
GFR, Estimated: 35 mL/min — ABNORMAL LOW (ref >60)
Glucose, Bld: 150 mg/dL — ABNORMAL HIGH (ref 70–99)
Glucose, Bld: 140 mg/dL — ABNORMAL HIGH (ref 70–99)
Potassium: 3.2 mmol/L — ABNORMAL LOW (ref 3.5–5.1)
Potassium: 3 mmol/L — ABNORMAL LOW (ref 3.5–5.1)
Sodium: 131 mmol/L — ABNORMAL LOW (ref 135–145)
Sodium: 130 mmol/L — ABNORMAL LOW (ref 135–145)
Total Bilirubin: 0.7 mg/dL (ref 0.3–1.2)
Total Bilirubin: 0.7 mg/dL (ref 0.3–1.2)
Total Protein: 7 g/dL (ref 6.5–8.1)
Total Protein: 6.9 g/dL (ref 6.5–8.1)

## 2021-10-14 LAB — CBC WITH DIFFERENTIAL/PLATELET
Abs Immature Granulocytes: 0.09 10*3/uL — ABNORMAL HIGH (ref 0.00–0.07)
Basophils Absolute: 0 10*3/uL (ref 0.0–0.1)
Basophils Relative: 0 %
Eosinophils Absolute: 0 10*3/uL (ref 0.0–0.5)
Eosinophils Relative: 0 %
HCT: 37.9 % — ABNORMAL LOW (ref 39.0–52.0)
Hemoglobin: 13 g/dL (ref 13.0–17.0)
Immature Granulocytes: 1 %
Lymphocytes Relative: 7 %
Lymphs Abs: 1 10*3/uL (ref 0.7–4.0)
MCH: 31 pg (ref 26.0–34.0)
MCHC: 34.3 g/dL (ref 30.0–36.0)
MCV: 90.5 fL (ref 80.0–100.0)
Monocytes Absolute: 1.3 10*3/uL — ABNORMAL HIGH (ref 0.1–1.0)
Monocytes Relative: 9 %
Neutro Abs: 11.4 10*3/uL — ABNORMAL HIGH (ref 1.7–7.7)
Neutrophils Relative %: 83 %
Platelets: 180 10*3/uL (ref 150–400)
RBC: 4.19 MIL/uL — ABNORMAL LOW (ref 4.22–5.81)
RDW: 13.2 % (ref 11.5–15.5)
WBC: 13.8 10*3/uL — ABNORMAL HIGH (ref 4.0–10.5)
nRBC: 0 % (ref 0.0–0.2)

## 2021-10-14 LAB — SODIUM, URINE, RANDOM: Sodium, Ur: 27 mmol/L

## 2021-10-14 LAB — TSH
TSH: 1.66 u[IU]/mL (ref 0.350–4.500)
TSH: 1.9 u[IU]/mL (ref 0.350–4.500)

## 2021-10-14 LAB — OSMOLALITY: Osmolality: 293 mOsm/kg (ref 275–295)

## 2021-10-14 LAB — MAGNESIUM
Magnesium: 1.6 mg/dL — ABNORMAL LOW (ref 1.7–2.4)
Magnesium: 1.6 mg/dL — ABNORMAL LOW (ref 1.7–2.4)

## 2021-10-14 LAB — PHOSPHORUS
Phosphorus: 3.2 mg/dL (ref 2.5–4.6)
Phosphorus: 3.3 mg/dL (ref 2.5–4.6)

## 2021-10-14 LAB — PROCALCITONIN: Procalcitonin: 17.31 ng/mL

## 2021-10-14 LAB — HIV ANTIBODY (ROUTINE TESTING W REFLEX): HIV Screen 4th Generation wRfx: NONREACTIVE

## 2021-10-14 LAB — CK: Total CK: 289 U/L (ref 49–397)

## 2021-10-14 LAB — OSMOLALITY, URINE: Osmolality, Ur: 565 mOsm/kg (ref 300–900)

## 2021-10-14 LAB — CREATININE, URINE, RANDOM: Creatinine, Urine: 165.5 mg/dL

## 2021-10-14 MED ORDER — LACTATED RINGERS IV SOLN: INTRAVENOUS | Status: DC

## 2021-10-14 MED ORDER — MIDAZOLAM HCL 2 MG/2ML IJ SOLN
INTRAMUSCULAR | Status: AC | PRN
Start: 2021-10-14 — End: 2021-10-14
  Administered 2021-10-14 (×2): 1 mg via INTRAVENOUS

## 2021-10-14 MED ORDER — MAGNESIUM SULFATE 2 GM/50ML IV SOLN
2.0000 g | Freq: Once | INTRAVENOUS | Status: AC
  Administered 2021-10-14: 2 g via INTRAVENOUS
  Filled 2021-10-14: qty 50

## 2021-10-14 MED ORDER — FENTANYL CITRATE (PF) 100 MCG/2ML IJ SOLN
INTRAMUSCULAR | Status: AC
  Filled 2021-10-14: qty 4

## 2021-10-14 MED ORDER — LIDOCAINE HCL 1 % IJ SOLN
INTRAMUSCULAR | Status: AC
  Filled 2021-10-14: qty 20

## 2021-10-14 MED ORDER — POTASSIUM CHLORIDE 10 MEQ/100ML IV SOLN
10.0000 meq | INTRAVENOUS | Status: AC
  Administered 2021-10-14 (×2): 10 meq via INTRAVENOUS
  Filled 2021-10-14: qty 100

## 2021-10-14 MED ORDER — KCL IN DEXTROSE-NACL 20-5-0.9 MEQ/L-%-% IV SOLN
INTRAVENOUS | Status: DC
  Filled 2021-10-14 (×2): qty 1000

## 2021-10-14 MED ORDER — SODIUM CHLORIDE 0.9% FLUSH
5.0000 mL | Freq: Three times a day (TID) | INTRAVENOUS | Status: DC
  Administered 2021-10-14: 5 mL

## 2021-10-14 MED ORDER — LIDOCAINE HCL 1 % IJ SOLN
INTRAMUSCULAR | Status: AC | PRN
  Administered 2021-10-14: 15 mL via INTRADERMAL

## 2021-10-14 MED ORDER — POTASSIUM CHLORIDE 10 MEQ/100ML IV SOLN
10.0000 meq | INTRAVENOUS | Status: AC
  Administered 2021-10-14 (×4): 10 meq via INTRAVENOUS
  Filled 2021-10-14 (×4): qty 100

## 2021-10-14 MED ORDER — FENTANYL CITRATE (PF) 100 MCG/2ML IJ SOLN
INTRAMUSCULAR | Status: AC | PRN
  Administered 2021-10-14 (×2): 50 ug via INTRAVENOUS
  Administered 2021-10-14: 100 ug via INTRAVENOUS

## 2021-10-14 MED ORDER — ALLOPURINOL 100 MG PO TABS
100.0000 mg | ORAL_TABLET | Freq: Every day | ORAL | Status: DC
  Administered 2021-10-14 – 2021-10-15 (×2): 100 mg via ORAL
  Filled 2021-10-14 (×2): qty 1

## 2021-10-14 MED ORDER — MIDAZOLAM HCL 2 MG/2ML IJ SOLN
INTRAMUSCULAR | Status: AC
  Filled 2021-10-14: qty 4

## 2021-10-14 MED ORDER — IOHEXOL 300 MG/ML  SOLN
100.0000 mL | Freq: Once | INTRAMUSCULAR | Status: AC | PRN
  Administered 2021-10-14: 10 mL

## 2021-10-14 NOTE — Progress Notes (Signed)
Patient spike a fever of 102.1 PRN Tylenol was given re checked Temperature after an hour and went down to 101.0 . Paged on call provider and recommend cooling down the room and use ice packs, we will carry out and monitor.

## 2021-10-14 NOTE — Assessment & Plan Note (Addendum)
Likely in the setting of dehydration will rehydrate and follow renal function Obtain urine electrolytes

## 2021-10-14 NOTE — Consult Note (Signed)
Chief Complaint: Patient was seen in consultation today for percutaneous cholecystostomy Chief Complaint  Patient presents with   Abdominal Pain     Referring Physician(s): Martin,M  Supervising Physician: Markus Daft  Patient Status: [facility] - In-pt  History of Present Illness: Bobby Ortiz is a 61 y.o. male with a PMH HTN, HLD, and gout who presented to the ED with a cc of 4-5 days of abdominal pain after heavy lifting.  Patient states he was moving some heavy marble on Saturday and then developed right-sided abdominal pain, thought he pulled a muscle.  On Sunday the pain persisted and he developed nausea and vomiting.  He thought he might have a virus so decided to monitor symptoms at home.  When he did not get better he came into the ED for evaluation.  Pain described as right-sided abdominal pain that is worse with movement and palpation. Denies a history of similar pain. Associated with nausea, vomiting, and diarrhea.  Patient tested negative for COVID 19 and FLU.  Blood cultures negative to date.  Has had some low-grade temperature elevations.  CT abdomen pelvis revealed cholelithiasis with acute cholecystitis.  Ultrasound of abdomen revealed severe gallbladder wall edema but no stones identified.  No definite bile duct obstruction, probable hepatic steatosis.  Latest temp 98, BP okay, WBC 13.8, hemoglobin normal, platelets normal, PT/INR normal, potassium 3.9, creatinine 2.1.  Patient on Rocephin and Flagyl.  Patient was seen by surgery and request now received percutaneous cholecystostomy.   Past Medical History:  Diagnosis Date   Hypertension     Past Surgical History:  Procedure Laterality Date   APPENDECTOMY     IRRIGATION AND DEBRIDEMENT ABSCESS Right 07/25/2014   Procedure: IRRIGATION AND DEBRIDEMENT ABSCESS RIGHT GROIN;  Surgeon: Claybon Jabs, MD;  Location: [place];  Service: Urology;  Laterality: Right;    Allergies: Patient has no known  allergies.  Medications: Prior to Admission medications   Medication Sig Start Date End Date Taking? Authorizing Provider  allopurinol (ZYLOPRIM) 100 MG tablet TAKE ONE (1) TABLET EACH DAY Patient taking differently: Take 100 mg by mouth daily. 08/11/21  Yes Hawks, Christy A, FNP  amLODipine (NORVASC) 5 MG tablet TAKE ONE (1) TABLET BY MOUTH EVERY DAY Patient taking differently: Take 5 mg by mouth daily. 10/08/21  Yes Hawks, Christy A, FNP  aspirin EC 81 MG tablet Take 81 mg by mouth daily.   Yes [provider]  colchicine 0.6 MG tablet Take twice daily for gout attack. (may take every two hours up to 6 doses at acute onset) Patient taking differently: Take 0.6 mg by mouth See admin instructions. Take 0.6mg  by mouth twice daily for gout attack. (may take every two hours up to 6 doses at acute onset) 08/11/21  Yes Hawks, Christy A, FNP  ibuprofen (ADVIL,MOTRIN) 200 MG tablet Take 400-600 mg by mouth every 6 (six) hours as needed for moderate pain.   Yes [provider]  lisinopril (ZESTRIL) 40 MG tablet Take 1 tablet (40 mg total) by mouth daily. 08/11/21  Yes Hawks, Christy A, FNP  pravastatin (PRAVACHOL) 80 MG tablet Take 1 tablet (80 mg total) by mouth daily. Patient not taking: Reported on 10/13/2021 08/11/21   Sharion Balloon, FNP     Family History  Problem Relation Age of Onset   Heart disease Mother        Stents   Cancer Mother        Stomach   Heart disease Father  Heart attack    Social History   Socioeconomic History   Marital status: Married    Spouse name: Not on file   Number of children: Not on file   Years of education: Not on file   Highest education level: Not on file  Occupational History   Not on file  Tobacco Use   Smoking status: Never   Smokeless tobacco: Never  Vaping Use   Vaping Use: Never used  Substance and Sexual Activity   Alcohol use: No   Drug use: No   Sexual activity: Not on file  Other Topics Concern   Not on  file  Social History Narrative   Not on file   Social Determinants of Health   Financial Resource Strain: Not on file  Food Insecurity: Not on file  Transportation Needs: Not on file  Physical Activity: Not on file  Stress: Not on file  Social Connections: Not on file      Review of Systems see above; currently denies fever, headache, chest pain, dyspnea, cough, nausea, vomiting, back pain, bleeding  Vital Signs: BP 116/78 (BP Location: Left Arm)    Pulse 78    Temp 98 F (36.7 C) (Oral)    Resp 17    SpO2 94%   Physical Exam awake, alert.  Chest clear to auscultation bilaterally.  Heart with regular rate and rhythm.  Abdomen obese, soft, positive bowel sounds, tender right upper quadrant to palpation.  No lower extremity edema.  Imaging: CT ABDOMEN PELVIS WO CONTRAST  Result Date: 10/13/2021 CLINICAL DATA:  Bowel obstruction suspected EXAM: CT ABDOMEN AND PELVIS WITHOUT CONTRAST TECHNIQUE: Multidetector CT imaging of the abdomen and pelvis was performed following the standard protocol without IV contrast. RADIATION DOSE REDUCTION: This exam was performed according to the departmental dose-optimization program which includes automated exposure control, adjustment of the mA and/or kV according to patient size and/or use of iterative reconstruction technique. COMPARISON:  None. FINDINGS: Lower chest: No acute abnormality. Hepatobiliary: No focal liver abnormality. Gallbladder wall thickening and pericholecystic fluid. Layering hyperdensity within gallbladder lumen may represent tiny gallstones versus gallbladder sludge. No biliary dilatation. Pancreas: No focal lesion. Normal pancreatic contour. No surrounding inflammatory changes. No main pancreatic ductal dilatation. Spleen: Normal in size without focal abnormality. Adrenals/Urinary Tract: No adrenal nodule bilaterally. No nephrolithiasis and no hydronephrosis. Parapelvic 4.1 x 3.5 cm fluid density lesion likely representing a parapelvic  cyst. No ureterolithiasis or hydroureter. The urinary bladder is unremarkable. Stomach/Bowel: Stomach is within normal limits. Vague reactive changes of the duodenum. Otherwise no evidence of bowel wall thickening or dilatation. Status post appendectomy. Vascular/Lymphatic: No abdominal aorta or iliac aneurysm. Mild atherosclerotic plaque of the aorta and its branches. No abdominal, pelvic, or inguinal lymphadenopathy. Reproductive: Prostate is unremarkable. Other: No intraperitoneal free fluid. No intraperitoneal free gas. No organized fluid collection. Musculoskeletal: No abdominal wall hernia or abnormality. No suspicious lytic or blastic osseous lesions. No acute displaced fracture. Multilevel degenerative changes of the spine. IMPRESSION: Cholelithiasis with acute cholecystitis. Electronically Signed   By: Iven Finn M.D.   On: 10/13/2021 18:33   DG Chest Port 1 View  Result Date: 10/13/2021 CLINICAL DATA:  Questionable sepsis. EXAM: PORTABLE CHEST 1 VIEW COMPARISON:  CT abdomen and pelvis 10/13/2021. FINDINGS: The heart size and mediastinal contours are within normal limits. Both lungs are clear. The visualized skeletal structures are unremarkable. IMPRESSION: No active disease. Electronically Signed   By: Ronney Asters M.D.   On: 10/13/2021 19:24   US Abdomen  Limited RUQ (LIVER/GB)  Result Date: 10/14/2021 CLINICAL DATA:  61 year old male with nausea, vomiting. EXAM: ULTRASOUND ABDOMEN LIMITED RIGHT UPPER QUADRANT COMPARISON:  Noncontrast CT Abdomen and Pelvis yesterday. FINDINGS: Gallbladder: Diffuse heterogeneous gallbladder wall thickening and/or edema (images 4, 15). Wall thickness 10 mm or greater. No shadowing echogenic stones. Mild if any layering sludge. Sonographic Murphy sign not specified. Common bile duct: Diameter: 5-7 mm, not definitely dilated. Liver: Prominent hepatic veins suspected on image 63. No definite intrahepatic biliary ductal dilatation. Mildly echogenic liver. No  discrete liver lesion. Portal vein is patent on color Doppler imaging with normal direction of blood flow towards the liver. Other: Negative visible right kidney. IMPRESSION: 1. Severe gallbladder wall edema, but no cholelithiasis identified by ultrasound. Favor Acalculous Acute Cholecystitis. 2. No definite bile duct obstruction. 3. Probable hepatic steatosis. Electronically Signed   By: Genevie Ann M.D.   On: 10/14/2021 09:03    Labs:  CBC: Recent Labs    10/13/21 1616 10/14/21 0405  WBC 15.8* 13.8*  HGB 14.3 13.0  HCT 41.4 37.9*  PLT 219 180    COAGS: Recent Labs    10/13/21 1914  INR 1.1    BMP: Recent Labs    10/13/21 1616 10/13/21 2328 10/14/21 0405  NA 130* 131* 130*  K 4.5 3.2* 3.0*  CL 92* 93* 95*  CO2 24 23 23   GLUCOSE 163* 150* 140*  BUN 57* 53* 50*  CALCIUM 9.1 8.3* 8.2*  CREATININE 3.06* 2.66* 2.10*  GFRNONAA 23* 27* 35*    LIVER FUNCTION TESTS: Recent Labs    10/13/21 1616 10/13/21 2328 10/14/21 0405  BILITOT 0.9 0.7 0.7  AST 46* 40 39  ALT 39 42 41  ALKPHOS 77 78 79  PROT 7.9 7.0 6.9  ALBUMIN 3.6 3.2* 3.1*    TUMOR MARKERS: No results for input(s): AFPTM, CEA, CA199, CHROMGRNA in the last 8760 hours.  Assessment and Plan: 61 y.o. male with a PMH HTN, HLD, and gout who presented to the ED with a cc of 4-5 days of abdominal pain after heavy lifting.  Patient states he was moving some heavy marble on Saturday and then developed right-sided abdominal pain, thought he pulled a muscle.  On Sunday the pain persisted and he developed nausea and vomiting.  He thought he might have a virus so decided to monitor symptoms at home.  When he did not get better he came into the ED for evaluation.  Pain described as right-sided abdominal pain that is worse with movement and palpation. Denies a history of similar pain. Associated with nausea, vomiting, and diarrhea.  Patient tested negative for COVID 19 and FLU.  Blood cultures negative to date.  Has had some  low-grade temperature elevations.  CT abdomen pelvis revealed cholelithiasis with acute cholecystitis.  Ultrasound of abdomen revealed severe gallbladder wall edema but no stones identified.  No definite bile duct obstruction, probable hepatic steatosis.  Latest temp 98, BP okay, WBC 13.8, hemoglobin normal, platelets normal, PT/INR normal, potassium 3.9, creatinine 2.1.  Patient on Rocephin and Flagyl.  Patient was seen by surgery and request now received percutaneous cholecystostomy.  Imaging studies were reviewed by Dr. Anselm Pancoast.  Details/risks of procedure, including but not limited to, internal bleeding, infection, injury to adjacent structures, need for prolonged drainage discussed with patient and spouse with their understanding and consent.  Procedure tentatively scheduled for later today.   Thank you for this interesting consult.  I greatly enjoyed meeting Bobby Ortiz and look forward to participating  in their care.  A copy of this report was sent to the requesting provider on this date.  Electronically Signed: D. Rowe Robert, PA-C 10/14/2021, 11:54 AM   I spent a total of  25 minutes   in face to face in clinical consultation, greater than 50% of which was counseling/coordinating care for percutaneous cholecystostomy

## 2021-10-14 NOTE — Progress Notes (Signed)
°  Transition of Care [facility]) Screening Note   Patient Details  Name: Rita Dewinter Date of Birth: 06/07/1961   Transition of Care [facility]) CM/SW Contact:    Lennart Pall, LCSW Phone Number: 10/14/2021, 12:58 PM    Transition of Care Department [facility]) has reviewed patient and no TOC needs have been identified at this time. We will continue to monitor patient advancement through interdisciplinary progression rounds. If new patient transition needs arise, please place a TOC consult.

## 2021-10-14 NOTE — Assessment & Plan Note (Signed)
-  chronic avoid nephrotoxic medications such as NSAIDs, Vanco Zosyn combo,  avoid hypotension, continue to follow renal function  

## 2021-10-14 NOTE — Assessment & Plan Note (Signed)
-  SIRS criteria met with  elevated white blood cell count,       Component Value Date/Time   WBC 15.8 (H) 10/13/2021 1616   LYMPHSABS 2.3 07/16/2020 1200     tachycardia    Fever  RR >20 Today's Vitals   10/13/21 1945 10/13/21 2030 10/13/21 2115 10/13/21 2212  BP: 117/63 117/68 117/81 (!) 141/88  Pulse: 73 77 91 (!) 105  Resp: (!) 32 (!) _0 Temp:    (!) 100.4 F (38 C)  TempSrc:    Oral  SpO2: 97% 96% 94% 95%  PainSc:    0-No pain    -Most likely source being: intra-abdominal,   Patient meeting criteria for Severe sepsis with    evidence of end organ damage/organ dysfunction such as  Acute Kidney Injury with Cr > 2,  Lab Results  Component Value Date   CREATININE 3.06 (H) 10/13/2021   CREATININE 1.27 07/16/2020      - Obtain serial lactic acid and procalcitonin level.  - Initiated IV antibiotics in ER: Antibiotics Given (last 72 hours)    Date/Time Action Medication Dose Rate   10/13/21 2013 New Bag/Given   metroNIDAZOLE (FLAGYL) IVPB 500 mg 500 mg 100 mL/hr      Will continue  on ceftriaxone and Flagyl   - await results of blood and urine culture  - Rehydrate aggressively  Intravenous fluids were administered     12:11 AM

## 2021-10-14 NOTE — Progress Notes (Signed)
Bobby Ortiz  ION:[id number] DOB: 20-Mar-1961 DOA: 10/13/2021 PCP: Sharion Balloon, FNP    Brief Narrative:  61yo with a history of HTN, HLD, and gout who presented to the [facility] ED with intractable nausea vomiting and generalized abdominal pain x4 days.  Emesis was reported as bilious but nonbloody.  In the ED CT abdomen and pelvis noted cholelithiasis and there was clinical suspicion for acute cholecystitis.  He was also found to be hyponatremic and suffering with acute kidney injury.  He was transferred to [facility] for definitive care.  Consultants:  General Surgery  Code Status: FULL CODE  Antimicrobials:  Flagyl 1/18 > Rocephin 1/18 >  DVT prophylaxis: SCDs  Interim Hx: Low-grade fever with T-max 100.4.  Vital signs otherwise stable.  Saturation 94% on room air.  Sodium remains low at 130.  Potassium low at 3.0.  Creatinine improving.  LFTs normal.  Developing an appetite.  Is thirsty.  Denies severe abdominal pain at time of visit.  Denies shortness of breath or chest pain.  Assessment & Plan:  Acute cholecystitis w/ Sepsis  Care per General Surgery -continue empiric antibiotic -sepsis criteria met at time of admission with elevated WBC, elevated temperature, and gallbladder source of bacterial infection - plan for percutaneous cholecystostomy tube  Hyponatremia Due to volume depletion - continue with IV fluid resuscitation  Hypokalemia Due to GI losses and poor intake - supplement and follow  Hypomagnesemia Due to GI losses and poor intake - supplement and follow  Acute kidney failure Appears to be simply prerenal azotemia -continue to hydrate and follow  HTN Blood pressure well controlled at present in setting of sepsis and dehydration -follow trend with hydration  HLD Hold medical therapy until gallbladder addressed  Morbid obesity - There is no height or weight on file to calculate BMI.   Family Communication: Spoke with family at  bedside Disposition: From home -should be able to return home  Objective: Blood pressure 116/78, pulse 78, temperature 98 F (36.7 C), temperature source Oral, resp. rate 17, SpO2 94 %.  Intake/Output Summary (Last 24 hours) at 10/14/2021 0835 Last data filed at 10/14/2021 0532 Gross per 24 hour  Intake 2975.75 ml  Output 400 ml  Net 2575.75 ml   There were no vitals filed for this visit.  Examination: General: No acute respiratory distress Lungs: Clear to auscultation bilaterally without wheezes or crackles Cardiovascular: Regular rate and rhythm without murmur gallop or rub normal S1 and S2 Abdomen: Protuberant, tender to palpation in right upper quadrant, soft, bowel sounds positive, no rebound, no ascites, no appreciable mass Extremities: No significant cyanosis, clubbing, or edema bilateral lower extremities  CBC: Recent Labs  Lab 10/13/21 1616 10/14/21 0405  WBC 15.8* 13.8*  NEUTROABS  --  11.4*  HGB 14.3 13.0  HCT 41.4 37.9*  MCV 86.6 90.5  PLT 219 244   Basic Metabolic Panel: Recent Labs  Lab 10/13/21 1616 10/13/21 2328 10/14/21 0405  NA 130* 131* 130*  K 4.5 3.2* 3.0*  CL 92* 93* 95*  CO2 _0 GLUCOSE 163* 150* 140*  BUN 57* 53* 50*  CREATININE 3.06* 2.66* 2.10*  CALCIUM 9.1 8.3* 8.2*  MG  --  1.6* 1.6*  PHOS  --  3.2 3.3   GFR: CrCl cannot be calculated (Unknown ideal weight.).  Liver Function Tests: Recent Labs  Lab 10/13/21 1616 10/13/21 2328 10/14/21 0405  AST 46* 40 39  ALT 39 42 41  ALKPHOS 77 78 79  BILITOT 0.9 0.7 0.7  PROT 7.9 7.0 6.9  ALBUMIN 3.6 3.2* 3.1*   Recent Labs  Lab 10/13/21 1616  LIPASE 16    Coagulation Profile: Recent Labs  Lab 10/13/21 1914  INR 1.1    HbA1C: Hgb A1c MFr Bld  Date/Time Value Ref Range Status  07/24/2014 05:35 AM 5.9 (H) <5.7 % Final    Comment:    (NOTE)                                                                       According to the ADA Clinical Practice Recommendations  for 2011, when HbA1c is used as a screening test:  >=6.5%   Diagnostic of Diabetes Mellitus           (if abnormal result is confirmed) 5.7-6.4%   Increased risk of developing Diabetes Mellitus References:Diagnosis and Classification of Diabetes Mellitus,Diabetes KGMW,1027,25(DGUYQ 1):S62-S69 and Standards of Medical Care in         Diabetes - 2011,Diabetes IHKV,4259,56 (Suppl 1):S11-S61.    Recent Results (from the past 240 hour(s))  Resp Panel by RT-PCR (Flu A&B, Covid) Nasopharyngeal Swab     Status: None   Collection Time: 10/13/21  5:50 PM   Specimen: Nasopharyngeal Swab; Nasopharyngeal(NP) swabs in vial transport medium  Result Value Ref Range Status   SARS Coronavirus 2 by RT PCR NEGATIVE NEGATIVE Final    Comment: (NOTE) SARS-CoV-2 target nucleic acids are NOT DETECTED.  The SARS-CoV-2 RNA is generally detectable in upper respiratory specimens during the acute phase of infection. The lowest concentration of SARS-CoV-2 viral copies this assay can detect is 138 copies/mL. A negative result does not preclude SARS-Cov-2 infection and should not be used as the sole basis for treatment or other patient management decisions. A negative result may occur with  improper specimen collection/handling, submission of specimen other than nasopharyngeal swab, presence of viral mutation(s) within the areas targeted by this assay, and inadequate number of viral copies(<138 copies/mL). A negative result must be combined with clinical observations, patient history, and epidemiological information. The expected result is Negative.  Fact Sheet for Patients:  EntrepreneurPulse.com.au  Fact Sheet for Healthcare Providers:  IncredibleEmployment.be  This test is no t yet approved or cleared by the [place] FDA and  has been authorized for detection and/or diagnosis of SARS-CoV-2 by FDA under an Emergency Use Authorization (EUA). This EUA will remain  in  effect (meaning this test can be used) for the duration of the COVID-19 declaration under Section 564(b)(1) of the Act, 21 U.S.C.section 360bbb-3(b)(1), unless the authorization is terminated  or revoked sooner.       Influenza A by PCR NEGATIVE NEGATIVE Final   Influenza B by PCR NEGATIVE NEGATIVE Final    Comment: (NOTE) The Xpert Xpress SARS-CoV-2/FLU/RSV plus assay is intended as an aid in the diagnosis of influenza from Nasopharyngeal swab specimens and should not be used as a sole basis for treatment. Nasal washings and aspirates are unacceptable for Xpert Xpress SARS-CoV-2/FLU/RSV testing.  Fact Sheet for Patients: EntrepreneurPulse.com.au  Fact Sheet for Healthcare Providers: IncredibleEmployment.be  This test is not yet approved or cleared by the [place] FDA and has been authorized for detection and/or diagnosis of SARS-CoV-2 by FDA under an Emergency  Use Authorization (EUA). This EUA will remain in effect (meaning this test can be used) for the duration of the COVID-19 declaration under Section 564(b)(1) of the Act, 21 U.S.C. section 360bbb-3(b)(1), unless the authorization is terminated or revoked.  Performed at [facility], [address], [place], [place] [address]   Blood Culture (routine x 2)     Status: None (Preliminary result)   Collection Time: 10/13/21  7:25 PM   Specimen: BLOOD  Result Value Ref Range Status   Specimen Description   Final    BLOOD LEFT ANTECUBITAL Performed at [facility], [address] [place], [place] [address]    Special Requests   Final    Blood Culture adequate volume BOTTLES DRAWN AEROBIC AND ANAEROBIC Performed at [facility], [address] [place], [place] [address]    Culture   Final    NO GROWTH < 12 HOURS Performed at [facility], [place] [address], [place], [place] [address]    Report Status PENDING   Incomplete  Blood Culture (routine x 2)     Status: None (Preliminary result)   Collection Time: 10/13/21  7:34 PM   Specimen: BLOOD  Result Value Ref Range Status   Specimen Description   Final    BLOOD BLOOD LEFT HAND Performed at [facility], [address], [place], [place] [address]    Special Requests   Final    BOTTLES DRAWN AEROBIC AND ANAEROBIC Blood Culture adequate volume Performed at [facility], [address], [place], [place] [address]    Culture   Final    NO GROWTH < 12 HOURS Performed at [facility], [place] [address] [place], [place] [address]    Report Status PENDING  Incomplete  Culture, blood (x 2)     Status: None (Preliminary result)   Collection Time: 10/13/21 11:28 PM   Specimen: BLOOD  Result Value Ref Range Status   Specimen Description   Final    BLOOD BLOOD LEFT HAND Performed at [facility] [address], [employer], [place] [address]    Special Requests   Final    BOTTLES DRAWN AEROBIC ONLY Blood Culture adequate volume Performed at [facility] [address] [place], [place] [address]    Culture   Final    NO GROWTH < 12 HOURS Performed at [facility] [address] [place], [place] [address]    Report Status PENDING  Incomplete  Culture, blood (x 2)     Status: None (Preliminary result)   Collection Time: 10/13/21 11:28 PM   Specimen: BLOOD  Result Value Ref Range Status   Specimen Description   Final    BLOOD BLOOD RIGHT ARM Performed at [facility] [address] [place], [place] [address]    Special Requests   Final    BOTTLES DRAWN AEROBIC ONLY Blood Culture adequate volume Performed at [facility] [address] [place], [place] [address]    Culture   Final    NO GROWTH < 12 HOURS Performed at [facility] [address], [place],  [address]    Report Status PENDING   Incomplete     Scheduled Meds: Continuous Infusions:  cefTRIAXone (ROCEPHIN)  IV Stopped (10/13/21 2008)   cefTRIAXone (ROCEPHIN)  IV     lactated ringers 100 mL/hr at 10/14/21 0815   lactated ringers     metronidazole 500 mg (  10/14/21 0802)     LOS: 1 day   Cherene Altes, MD [facility] Office  [phone] Pager - Text Page per Amion  If 7PM-7AM, please contact night-coverage per Amion 10/14/2021, 8:35 AM

## 2021-10-14 NOTE — Assessment & Plan Note (Signed)
Rehydrate and follow fluid status 

## 2021-10-14 NOTE — Consult Note (Addendum)
Bobby Ortiz [birth year]  [id number].    Requesting MD: Sharon SellerMcClung, MD Chief Complaint/Reason for Consult: cholecystitis   HPI:  [name] is a 61 y/o M with a PMH HTN, HLD, and gout who presented to the ED with a cc of 4-5 days of abdominal pain after heavy lifting.  Patient states he was moving some heavy marble on Saturday and then developed right-sided abdominal pain, thought he pulled a muscle.  On Sunday the pain persisted and he developed nausea and vomiting.  He thought he might have a virus so decided to monitor symptoms at home.  When he did not get better he came into the ED for evaluation.  Pain described as right-sided abdominal pain that is worse with movement and palpation. Denies a history of similar pain. Associated with nausea, vomiting, and diarrhea. Denies sick contacts.  Denies new foods.  Surgical hx: appendectomy when he was 61 years old.  States he had an umbilical hernia repair with mesh last year by Dr. Corliss Skainssuei Social Hx: denies alcohol, tobacco, or other drug use. Employed as a Naval architecttruck driver.  States his wife is a Engineer, civil (consulting)nurse.  He lives at home with his wife.  His mother, who has cancer lives in their basement, his mother-in-law also requires their assistance and lives down the street. Blood thinners: none   ROS: Review of Systems  All other systems reviewed and are negative.  Family History  Problem Relation Age of Onset   Heart disease Mother        Stents   Cancer Mother        Stomach   Heart disease Father        Heart attack    Past Medical History:  Diagnosis Date   Hypertension     Past Surgical History:  Procedure Laterality Date   APPENDECTOMY     IRRIGATION AND DEBRIDEMENT ABSCESS Right 07/25/2014   Procedure: IRRIGATION AND DEBRIDEMENT ABSCESS RIGHT GROIN;  Surgeon: Garnett FarmMark C Ottelin, MD;  [facility];  Service: Urology;  Laterality: Right;    Social History:  reports that he has never smoked. He has never used smokeless tobacco.  He reports that he does not drink alcohol and does not use drugs.  Allergies: No Known Allergies  Medications Prior to Admission  Medication Sig Dispense Refill   allopurinol (ZYLOPRIM) 100 MG tablet TAKE ONE (1) TABLET EACH DAY (Patient taking differently: Take 100 mg by mouth daily.) 90 tablet 3   amLODipine (NORVASC) 5 MG tablet TAKE ONE (1) TABLET BY MOUTH EVERY DAY (Patient taking differently: Take 5 mg by mouth daily.) 90 tablet 0   aspirin EC 81 MG tablet Take 81 mg by mouth daily.     colchicine 0.6 MG tablet Take twice daily for gout attack. (may take every two hours up to 6 doses at acute onset) (Patient taking differently: Take 0.6 mg by mouth See admin instructions. Take 0.6mg  by mouth twice daily for gout attack. (may take every two hours up to 6 doses at acute onset)) 60 tablet 0   ibuprofen (ADVIL,MOTRIN) 200 MG tablet Take 400-600 mg by mouth every 6 (six) hours as needed for moderate pain.     lisinopril (ZESTRIL) 40 MG tablet Take 1 tablet (40 mg total) by mouth daily. 90 tablet 3   pravastatin (PRAVACHOL) 80 MG tablet Take 1 tablet (80 mg total) by mouth daily. (Patient not taking: Reported on 10/13/2021) 90 tablet 3     Physical Exam: Blood  pressure 116/78, pulse 78, temperature 98 F (36.7 C), temperature source Oral, resp. rate 17, SpO2 94 %. General: Pleasant white male laying on hospital bed, NAD. HEENT: head -normocephalic, atraumatic; Eyes: PERRLA, anicteric sclerae  Neck- Trachea is midline CV- RRR, normal S1/S2, no M/R/G, DP pulses 2+ BL Pulm- breathing is non-labored. CTABL, no wheezes, rhales, rhonchi. Abd- soft, obese, open appendectomy scar in right lower quadrant appears well-healing, there is right upper quadrant tenderness with a positive Murphy's sign. GU- deferred  MSK- UE/LE symmetrical, no cyanosis, clubbing, or edema. Neuro- CN II-XII grossly in tact, no paresthesias. Psych- Alert and Oriented x3 with appropriate affect Skin: warm and dry, no rashes  or lesions   Results for orders placed or performed during the hospital encounter of 10/13/21 (from the past 48 hour(s))  Lipase, blood     Status: None   Collection Time: 10/13/21  4:16 PM  Result Value Ref Range   Lipase 16 11 - 51 U/L    Comment: Performed at [facility], [address] [place], [place] [address]  Comprehensive metabolic panel     Status: Abnormal   Collection Time: 10/13/21  4:16 PM  Result Value Ref Range   Sodium 130 (L) 135 - 145 mmol/L   Potassium 4.5 3.5 - 5.1 mmol/L   Chloride 92 (L) 98 - 111 mmol/L   CO2 24 22 - 32 mmol/L   Glucose, Bld 163 (H) 70 - 99 mg/dL    Comment: Glucose reference range applies only to samples taken after fasting for at least 8 hours.   BUN 57 (H) 6 - 20 mg/dL   Creatinine, Ser 6.21 (H) 0.61 - 1.24 mg/dL   Calcium 9.1 8.9 - 30.8 mg/dL   Total Protein 7.9 6.5 - 8.1 g/dL   Albumin 3.6 3.5 - 5.0 g/dL   AST 46 (H) 15 - 41 U/L   ALT 39 0 - 44 U/L   Alkaline Phosphatase 77 38 - 126 U/L   Total Bilirubin 0.9 0.3 - 1.2 mg/dL   GFR, Estimated 23 (L) >60 mL/min    Comment: (NOTE) Calculated using the CKD-EPI Creatinine Equation (2021)    Anion gap 14 5 - 15    Comment: Performed at [facility], [address] [place], [place] [address]  CBC     Status: Abnormal   Collection Time: 10/13/21  4:16 PM  Result Value Ref Range   WBC 15.8 (H) 4.0 - 10.5 K/uL   RBC 4.78 4.22 - 5.81 MIL/uL   Hemoglobin 14.3 13.0 - 17.0 g/dL   HCT 69.6 29.5 - 28.4 %   MCV 86.6 80.0 - 100.0 fL   MCH 29.9 26.0 - 34.0 pg   MCHC 34.5 30.0 - 36.0 g/dL   RDW 13.2 44.0 - 10.2 %   Platelets 219 150 - 400 K/uL   nRBC 0.0 0.0 - 0.2 %    Comment: Performed at [facility], [address] [place], [place] [address]  Resp Panel by RT-PCR (Flu A&B, Covid) Nasopharyngeal Swab     Status: None   Collection Time: 10/13/21  5:50 PM   Specimen: Nasopharyngeal Swab; Nasopharyngeal(NP) swabs in vial transport  medium  Result Value Ref Range   SARS Coronavirus 2 by RT PCR NEGATIVE NEGATIVE    Comment: (NOTE) SARS-CoV-2 target nucleic acids are NOT DETECTED.  The SARS-CoV-2 RNA is generally detectable in upper respiratory specimens during the acute phase of infection. The lowest concentration of SARS-CoV-2 viral copies this assay  can detect is 138 copies/mL. A negative result does not preclude SARS-Cov-2 infection and should not be used as the sole basis for treatment or other patient management decisions. A negative result may occur with  improper specimen collection/handling, submission of specimen other than nasopharyngeal swab, presence of viral mutation(s) within the areas targeted by this assay, and inadequate number of viral copies(<138 copies/mL). A negative result must be combined with clinical observations, patient history, and epidemiological information. The expected result is Negative.  Fact Sheet for Patients:  BloggerCourse.com  Fact Sheet for Healthcare Providers:  SeriousBroker.it  This test is no t yet approved or cleared by the [place] FDA and  has been authorized for detection and/or diagnosis of SARS-CoV-2 by FDA under an Emergency Use Authorization (EUA). This EUA will remain  in effect (meaning this test can be used) for the duration of the COVID-19 declaration under Section 564(b)(1) of the Act, 21 U.S.C.section 360bbb-3(b)(1), unless the authorization is terminated  or revoked sooner.       Influenza A by PCR NEGATIVE NEGATIVE   Influenza B by PCR NEGATIVE NEGATIVE    Comment: (NOTE) The Xpert Xpress SARS-CoV-2/FLU/RSV plus assay is intended as an aid in the diagnosis of influenza from Nasopharyngeal swab specimens and should not be used as a sole basis for treatment. Nasal washings and aspirates are unacceptable for Xpert Xpress SARS-CoV-2/FLU/RSV testing.  Fact Sheet for  Patients: BloggerCourse.com  Fact Sheet for Healthcare Providers: SeriousBroker.it  This test is not yet approved or cleared by the [place] FDA and has been authorized for detection and/or diagnosis of SARS-CoV-2 by FDA under an Emergency Use Authorization (EUA). This EUA will remain in effect (meaning this test can be used) for the duration of the COVID-19 declaration under Section 564(b)(1) of the Act, 21 U.S.C. section 360bbb-3(b)(1), unless the authorization is terminated or revoked.  Performed at [facility], [address], [place], [place] [address]   CK     Status: None   Collection Time: 10/13/21  5:50 PM  Result Value Ref Range   Total CK 300 49 - 397 U/L    Comment: Performed at [facility], [address] [place], [place] [address]  Urinalysis, Routine w reflex microscopic Urine, Clean Catch     Status: Abnormal   Collection Time: 10/13/21  7:14 PM  Result Value Ref Range   Color, Urine YELLOW YELLOW   APPearance HAZY (A) CLEAR   Specific Gravity, Urine 1.020 1.005 - 1.030   pH 5.5 5.0 - 8.0   Glucose, UA NEGATIVE NEGATIVE mg/dL   Hgb urine dipstick MODERATE (A) NEGATIVE   Bilirubin Urine NEGATIVE NEGATIVE   Ketones, ur NEGATIVE NEGATIVE mg/dL   Protein, ur 735 (A) NEGATIVE mg/dL   Nitrite NEGATIVE NEGATIVE   Leukocytes,Ua NEGATIVE NEGATIVE   RBC / HPF 0-5 0 - 5 RBC/hpf   WBC, UA 6-10 0 - 5 WBC/hpf   Squamous Epithelial / LPF 0-5 0 - 5   Mucus PRESENT    Hyaline Casts, UA PRESENT    Granular Casts, UA PRESENT    Amorphous Crystal PRESENT     Comment: Performed at [facility], [address] [place], [place], [place] [address]  Lactic acid, plasma     Status: None   Collection Time: 10/13/21  7:14 PM  Result Value Ref Range   Lactic Acid, Venous 1.3 0.5 - 1.9 mmol/L    Comment: Performed at [facility], [address],  [place], [place]  [id number]  Protime-INR     Status: None   Collection Time: 10/13/21  7:14 PM  Result Value Ref Range   Prothrombin Time 14.1 11.4 - 15.2 seconds   INR 1.1 0.8 - 1.2    Comment: (NOTE) INR goal varies based on device and disease states. Performed at [facility], [address], [place], [place] [address]   Lactic acid, plasma     Status: None   Collection Time: 10/13/21  9:30 PM  Result Value Ref Range   Lactic Acid, Venous 1.1 0.5 - 1.9 mmol/L    Comment: Performed at [facility], [address], [place], [place] [address]  Comprehensive metabolic panel     Status: Abnormal   Collection Time: 10/13/21 11:28 PM  Result Value Ref Range   Sodium 131 (L) 135 - 145 mmol/L   Potassium 3.2 (L) 3.5 - 5.1 mmol/L   Chloride 93 (L) 98 - 111 mmol/L   CO2 23 22 - 32 mmol/L   Glucose, Bld 150 (H) 70 - 99 mg/dL    Comment: Glucose reference range applies only to samples taken after fasting for at least 8 hours.   BUN 53 (H) 6 - 20 mg/dL   Creatinine, Ser 9.142.66 (H) 0.61 - 1.24 mg/dL   Calcium 8.3 (L) 8.9 - 10.3 mg/dL   Total Protein 7.0 6.5 - 8.1 g/dL   Albumin 3.2 (L) 3.5 - 5.0 g/dL   AST 40 15 - 41 U/L   ALT 42 0 - 44 U/L   Alkaline Phosphatase 78 38 - 126 U/L   Total Bilirubin 0.7 0.3 - 1.2 mg/dL   GFR, Estimated 27 (L) >60 mL/min    Comment: (NOTE) Calculated using the CKD-EPI Creatinine Equation (2021)    Anion gap 15 5 - 15    Comment: Performed at [facility], [address] [place], [place] [address]  CK     Status: None   Collection Time: 10/13/21 11:28 PM  Result Value Ref Range   Total CK 289 49 - 397 U/L    Comment: Performed at [facility], [address] [place], [place] [address]  Magnesium     Status: Abnormal   Collection Time: 10/13/21 11:28 PM  Result Value Ref Range   Magnesium 1.6 (L) 1.7 - 2.4 mg/dL    Comment: Performed at [facility], [address] [place], [place] [address]  Phosphorus     Status: None   Collection Time: 10/13/21 11:28 PM  Result Value Ref Range   Phosphorus 3.2 2.5 - 4.6 mg/dL    Comment: Performed at [facility], [address] [place], [place] [address]  Osmolality     Status: None   Collection Time: 10/13/21 11:28 PM  Result Value Ref Range   Osmolality 293 275 - 295 mOsm/kg    Comment: Performed at [facility], [address] [place], [place] [address]  TSH     Status: None   Collection Time: 10/13/21 11:28 PM  Result Value Ref Range   TSH 1.660 0.350 - 4.500 uIU/mL    Comment: Performed by a 3rd Generation assay with a functional sensitivity of <=0.01 uIU/mL. Performed at [facility], [address] [place], [place] [address]   Procalcitonin     Status: None   Collection Time: 10/13/21 11:28 PM  Result Value Ref Range   Procalcitonin 17.31 ng/mL    Comment:  Interpretation: PCT >= 10 ng/mL: Important systemic inflammatory response, almost exclusively due to severe bacterial sepsis or septic shock. (NOTE)       Sepsis PCT Algorithm           Lower Respiratory Tract                                      Infection PCT Algorithm    ----------------------------     ----------------------------         PCT < 0.25 ng/mL                PCT < 0.10 ng/mL          Strongly encourage             Strongly discourage   discontinuation of antibiotics    initiation of antibiotics    ----------------------------     -----------------------------       PCT 0.25 - 0.50 ng/mL            PCT 0.10 - 0.25 ng/mL               OR       >80% decrease in PCT            Discourage initiation of                                            antibiotics      Encourage discontinuation           of antibiotics    ----------------------------     -----------------------------         PCT >= 0.50 ng/mL              PCT 0.26 - 0.50 ng/mL                AND        <80% decrease in PCT             Encourage initiation of                                             antibiotics       Encourage continuation           of antibiotics    ----------------------------     -----------------------------        PCT >= 0.50 ng/mL                  PCT > 0.50 ng/mL               AND         increase in PCT                  Strongly encourage                                      initiation of antibiotics    Strongly encourage escalation           of antibiotics                                     -----------------------------  PCT <= 0.25 ng/mL                                                 OR                                        > 80% decrease in PCT                                      Discontinue / Do not initiate                                             antibiotics  Performed at [facility], [address], [place], [place] [address]   Osmolality, urine     Status: None   Collection Time: 10/14/21  2:00 AM  Result Value Ref Range   Osmolality, Ur 565 300 - 900 mOsm/kg    Comment: Performed at [facility], [address], [place], [place] [address]  Sodium, urine, random     Status: None   Collection Time: 10/14/21  2:00 AM  Result Value Ref Range   Sodium, Ur 27 mmol/L    Comment: Performed at [facility], [address] [place], [place] [address]  Creatinine, urine, random     Status: None   Collection Time: 10/14/21  2:00 AM  Result Value Ref Range   Creatinine, Urine 165.50 mg/dL    Comment: Performed at [facility], [address], [place], [place] [address]  Magnesium     Status: Abnormal   Collection Time: 10/14/21  4:05 AM  Result Value Ref Range   Magnesium 1.6 (L) 1.7 - 2.4 mg/dL    Comment: Performed at [facility], [address] [place], [place] [address]  Phosphorus     Status: None   Collection  Time: 10/14/21  4:05 AM  Result Value Ref Range   Phosphorus 3.3 2.5 - 4.6 mg/dL    Comment: Performed at [facility], [address], [place], [place] [address]  CBC WITH DIFFERENTIAL     Status: Abnormal   Collection Time: 10/14/21  4:05 AM  Result Value Ref Range   WBC 13.8 (H) 4.0 - 10.5 K/uL   RBC 4.19 (L) 4.22 - 5.81 MIL/uL   Hemoglobin 13.0 13.0 - 17.0 g/dL   HCT 52.8 (L) 41.3 - 24.4 %   MCV 90.5 80.0 - 100.0 fL   MCH 31.0 26.0 - 34.0 pg   MCHC 34.3 30.0 - 36.0 g/dL   RDW 01.0 27.2 - 53.6 %   Platelets 180 150 - 400 K/uL   nRBC 0.0 0.0 - 0.2 %   Neutrophils Relative % 83 %   Neutro Abs 11.4 (H) 1.7 - 7.7 K/uL   Lymphocytes Relative 7 %   Lymphs Abs 1.0 0.7 - 4.0 K/uL   Monocytes Relative 9 %   Monocytes Absolute 1.3 (H) 0.1 - 1.0 K/uL   Eosinophils Relative 0 %   Eosinophils Absolute 0.0 0.0 -  0.5 K/uL   Basophils Relative 0 %   Basophils Absolute 0.0 0.0 - 0.1 K/uL   Immature Granulocytes 1 %   Abs Immature Granulocytes 0.09 (H) 0.00 - 0.07 K/uL    Comment: Performed at [facility], [address] [place], [place] [address]  TSH     Status: None   Collection Time: 10/14/21  4:05 AM  Result Value Ref Range   TSH 1.900 0.350 - 4.500 uIU/mL    Comment: Performed by a 3rd Generation assay with a functional sensitivity of <=0.01 uIU/mL. Performed at [facility], [address] [place], [place] [address]   Comprehensive metabolic panel     Status: Abnormal   Collection Time: 10/14/21  4:05 AM  Result Value Ref Range   Sodium 130 (L) 135 - 145 mmol/L   Potassium 3.0 (L) 3.5 - 5.1 mmol/L   Chloride 95 (L) 98 - 111 mmol/L   CO2 23 22 - 32 mmol/L   Glucose, Bld 140 (H) 70 - 99 mg/dL    Comment: Glucose reference range applies only to samples taken after fasting for at least 8 hours.   BUN 50 (H) 6 - 20 mg/dL   Creatinine, Ser 0.98 (H) 0.61 - 1.24 mg/dL   Calcium 8.2 (L) 8.9 - 10.3 mg/dL   Total Protein 6.9  6.5 - 8.1 g/dL   Albumin 3.1 (L) 3.5 - 5.0 g/dL   AST 39 15 - 41 U/L   ALT 41 0 - 44 U/L   Alkaline Phosphatase 79 38 - 126 U/L   Total Bilirubin 0.7 0.3 - 1.2 mg/dL   GFR, Estimated 35 (L) >60 mL/min    Comment: (NOTE) Calculated using the CKD-EPI Creatinine Equation (2021)    Anion gap 12 5 - 15    Comment: Performed at [facility], [address] [place], [place] [address]   CT ABDOMEN PELVIS WO CONTRAST  Result Date: 10/13/2021 CLINICAL DATA:  Bowel obstruction suspected EXAM: CT ABDOMEN AND PELVIS WITHOUT CONTRAST TECHNIQUE: Multidetector CT imaging of the abdomen and pelvis was performed following the standard protocol without IV contrast. RADIATION DOSE REDUCTION: This exam was performed according to the departmental dose-optimization program which includes automated exposure control, adjustment of the mA and/or kV according to patient size and/or use of iterative reconstruction technique. COMPARISON:  None. FINDINGS: Lower chest: No acute abnormality. Hepatobiliary: No focal liver abnormality. Gallbladder wall thickening and pericholecystic fluid. Layering hyperdensity within gallbladder lumen may represent tiny gallstones versus gallbladder sludge. No biliary dilatation. Pancreas: No focal lesion. Normal pancreatic contour. No surrounding inflammatory changes. No main pancreatic ductal dilatation. Spleen: Normal in size without focal abnormality. Adrenals/Urinary Tract: No adrenal nodule bilaterally. No nephrolithiasis and no hydronephrosis. Parapelvic 4.1 x 3.5 cm fluid density lesion likely representing a parapelvic cyst. No ureterolithiasis or hydroureter. The urinary bladder is unremarkable. Stomach/Bowel: Stomach is within normal limits. Vague reactive changes of the duodenum. Otherwise no evidence of bowel wall thickening or dilatation. Status post appendectomy. Vascular/Lymphatic: No abdominal aorta or iliac aneurysm. Mild atherosclerotic plaque of the aorta and  its branches. No abdominal, pelvic, or inguinal lymphadenopathy. Reproductive: Prostate is unremarkable. Other: No intraperitoneal free fluid. No intraperitoneal free gas. No organized fluid collection. Musculoskeletal: No abdominal wall hernia or abnormality. No suspicious lytic or blastic osseous lesions. No acute displaced fracture. Multilevel degenerative changes of the spine. IMPRESSION: Cholelithiasis with acute cholecystitis. Electronically Signed   By: Tish Frederickson M.D.   On: 10/13/2021 18:33  DG Chest Port 1 View  Result Date: 10/13/2021 CLINICAL DATA:  Questionable sepsis. EXAM: PORTABLE CHEST 1 VIEW COMPARISON:  CT abdomen and pelvis 10/13/2021. FINDINGS: The heart size and mediastinal contours are within normal limits. Both lungs are clear. The visualized skeletal structures are unremarkable. IMPRESSION: No active disease. Electronically Signed   By: Darliss Cheney M.D.   On: 10/13/2021 19:24    Assessment/Plan Acute cholecystitis  - WBC 13.8, LFT's are WNL - CT A/P 10/13/20 w/ gallbladder wall thickening, pericholecystitis fluid, possible stones, and some stranding around the gallbladder.  - RUQ ultrasound with severe gallbladder wall edema and wall thickening and no visible gallstones -Patient with 6 days of abdominal pain and associated nausea.  He has a positive Murphy sign on exam.  Patient would benefit from laparoscopic cholecystectomy versus percutaneous cholecystostomy tube due the severity of his inflammatory changes in the right upper quadrant.  Continue IV antibiotics.  Will discuss surgical plan with Dr. Daphine Deutscher.  SIRS - resolved, HR and BP WNL, afebrile AKI - BUN 53, Cr 2.66; improving  Obesity  HTN HLD   FEN -NPO, IVF VTE -SCDs ID -Rocephin/Flagyl Admit - TRH service Moderate Medical Decision Making  Adam Phenix, [facility] 10/14/2021, 7:45 AM Please see Amion for pager number during day hours 7:00am-4:30pm or 7:00am -11:30am on  weekends

## 2021-10-14 NOTE — Assessment & Plan Note (Signed)
Chronic continue will need to follow-up as an outpatient with nutrition

## 2021-10-14 NOTE — Assessment & Plan Note (Addendum)
Appreciate general surgery consult continue ceftriaxone and Flagyl.  N.p.o. postmidnight.  Pain management as needed Anticipate surgical intervention tomorrow

## 2021-10-14 NOTE — Assessment & Plan Note (Signed)
In the setting of dehydration obtain urine electrolytes rehydrate and follow serial BMet

## 2021-10-14 NOTE — Procedures (Signed)
Interventional Radiology Procedure:   Indications: Acute cholecystitis  Procedure: Percutaneous cholecystostomy  Findings: Abnormal distended gallbladder with wall thickening.  Placed 10 Fr drain and removed greater than 80 ml of cloudy brown fluid.  Gallbladder was decompressed at end of procedure.   Complications: No immediate complications noted.     EBL: Minimal  Plan: Send fluid for culture.     Bobby Ortiz R. Anselm Pancoast, MD  Pager: [phone]

## 2021-10-15 LAB — COMPREHENSIVE METABOLIC PANEL
ALT: 37 U/L (ref 0–44)
AST: 31 U/L (ref 15–41)
Albumin: 2.9 g/dL — ABNORMAL LOW (ref 3.5–5.0)
Alkaline Phosphatase: 79 U/L (ref 38–126)
Anion gap: 9 (ref 5–15)
BUN: 32 mg/dL — ABNORMAL HIGH (ref 6–20)
CO2: 24 mmol/L (ref 22–32)
Calcium: 8.2 mg/dL — ABNORMAL LOW (ref 8.9–10.3)
Chloride: 98 mmol/L (ref 98–111)
Creatinine, Ser: 1.62 mg/dL — ABNORMAL HIGH (ref 0.61–1.24)
GFR, Estimated: 48 mL/min — ABNORMAL LOW (ref >60)
Glucose, Bld: 189 mg/dL — ABNORMAL HIGH (ref 70–99)
Potassium: 3.6 mmol/L (ref 3.5–5.1)
Sodium: 131 mmol/L — ABNORMAL LOW (ref 135–145)
Total Bilirubin: 0.5 mg/dL (ref 0.3–1.2)
Total Protein: 6.9 g/dL (ref 6.5–8.1)

## 2021-10-15 LAB — CBC
HCT: 39.1 % (ref 39.0–52.0)
Hemoglobin: 13.2 g/dL (ref 13.0–17.0)
MCH: 31.2 pg (ref 26.0–34.0)
MCHC: 33.8 g/dL (ref 30.0–36.0)
MCV: 92.4 fL (ref 80.0–100.0)
Platelets: 195 10*3/uL (ref 150–400)
RBC: 4.23 MIL/uL (ref 4.22–5.81)
RDW: 13.3 % (ref 11.5–15.5)
WBC: 10.5 10*3/uL (ref 4.0–10.5)
nRBC: 0 % (ref 0.0–0.2)

## 2021-10-15 LAB — MAGNESIUM: Magnesium: 2.4 mg/dL (ref 1.7–2.4)

## 2021-10-15 MED ORDER — ACETAMINOPHEN 325 MG PO TABS: 650.0000 mg | ORAL_TABLET | Freq: Four times a day (QID) | ORAL | Status: DC | PRN

## 2021-10-15 MED ORDER — AMOXICILLIN-POT CLAVULANATE 875-125 MG PO TABS: 1.0000 | ORAL_TABLET | Freq: Two times a day (BID) | ORAL | 0 refills | Status: AC

## 2021-10-15 MED ORDER — HYDROCODONE-ACETAMINOPHEN 5-325 MG PO TABS: 1.0000 | ORAL_TABLET | ORAL | 0 refills | Status: DC | PRN

## 2021-10-15 NOTE — Plan of Care (Signed)
°  Problem: Education: °Goal: Knowledge of General Education information will improve °Description: Including pain rating scale, medication(s)/side effects and non-pharmacologic comfort measures °Outcome: Adequate for Discharge °  °Problem: Health Behavior/Discharge Planning: °Goal: Ability to manage health-related needs will improve °Outcome: Adequate for Discharge °  °Problem: Clinical Measurements: °Goal: Ability to maintain clinical measurements within normal limits will improve °Outcome: Adequate for Discharge °Goal: Will remain free from infection °Outcome: Adequate for Discharge °Goal: Diagnostic test results will improve °Outcome: Adequate for Discharge °Goal: Respiratory complications will improve °Outcome: Adequate for Discharge °Goal: Cardiovascular complication will be avoided °Outcome: Adequate for Discharge °  °Problem: Nutrition: °Goal: Adequate nutrition will be maintained °Outcome: Adequate for Discharge °  °Problem: Pain Managment: °Goal: General experience of comfort will improve °Outcome: Adequate for Discharge °  °Problem: Safety: °Goal: Ability to remain free from injury will improve °Outcome: Adequate for Discharge °  °

## 2021-10-15 NOTE — Progress Notes (Signed)
° °  10/15/21   To Whom it may concern,  Bobby Ortiz was hospitalized at [facility] from 10/13/2021 through 10/15/2021.  He has been cleared to return to work on but not before 10/19/2021, but is instructed to avoid heavy lifting (nothing over 10#), straining, or significant physical exertion.    Should you have further questions please feel free to contact our office.  Be advised that no medical information will be divulged without a signed HIPA release from the patient.    Sincerely,  Cherene Altes, MD [facility] Office  [phone]

## 2021-10-15 NOTE — Discharge Summary (Signed)
DISCHARGE SUMMARY  Bobby Ortiz  MR#: [id number]  DOB:1960-11-06  Date of Admission: 10/13/2021 Date of Discharge: 10/15/2021  Attending Physician:Paschal Blanton Hennie Duos, MD  Patient's FSE:LTRVU, [name], FNP  Consults: Gen Surgery  IR  Disposition: D/C home   Follow-up Appts:  Follow-up Information     Markus Daft, MD Follow up in 1 month(s).   Specialties: Interventional Radiology, Radiology Contact information: [address] [place] [address] [phone]         Johnathan Hausen, MD Follow up in 5 week(s).   Specialty: General Surgery Why: Our office will call you with appiontment date and time. Contact information: [address] [place] [address] [phone]         Evelina Dun A, FNP Follow up in 1 week(s).   Specialty: Family Medicine Why: See your Primary Care Provider for a check in 5-7 days. You will need to have your BP checked, as well as a follow up of your kidney function with lab work. Contact information: [place] [address] [phone]                 Tests Needing Follow-up: -check BMET to assure renal function has fully recovered -check BP and determine if Norvasc and Lisinopril should be resumed   Discharge Diagnoses: Acute cholecystitis w/ Sepsis  Hyponatremia Hypokalemia Hypomagnesemia Acute kidney failure HTN HLD Morbid obesity - There is no height or weight on file to calculate BMI.  Initial presentation: 60yo with a history of HTN, HLD, and gout who presented to [place] with intractable nausea vomiting and generalized abdominal pain x4 days.  Emesis was reported as bilious but nonbloody.  In the ED CT abdomen and pelvis noted cholelithiasis and there was clinical suspicion for acute cholecystitis.  He was also found to be hyponatremic and suffering with acute kidney injury.  He was transferred to [facility] for definitive care.  Hospital  Course:  Acute cholecystitis w/ Sepsis  Care directed by General Surgery - continue empiric antibiotic w/ transition to oral at time of d/c - sepsis criteria met at time of admission with elevated WBC, elevated temperature, and gallbladder source of bacterial infection - s/p percutaneous cholecystostomy tube 1/19 - clinically much improved post tube placement - diet advanced w/o difficulty - abdom pain nearly resolved - pt d/c home with counseling on warning signs of recurring infection and need to seek immediate care should they arise    Hyponatremia Due to volume depletion - improved with volume resuscitation - tolerating oral intake at d/c    Hypokalemia Due to GI losses and poor intake -corrected with supplementation   Hypomagnesemia Due to GI losses and poor intake -corrected with supplementation   Acute kidney failure Appears to be simply prerenal azotemia -steadily improving with volume expansion - tolerating intake well - advised pt to push water over next 3-5 days - f/u w/ his PCP suggested in 5-7 days for recheck of BMET    HTN Blood pressure well controlled during this admit in setting of sepsis and dehydration - norvasc and ACEi on hold due to AKI and controlled BP - f/u of BP suggested in 5-7 days to determine if these meds need to be resumed    HLD Hold medical therapy for now - resume once able to tolerate a regular diet    Morbid obesity     Allergies as of 10/15/2021   No Known Allergies  Medication List     STOP taking these medications    amLODipine 5 MG tablet Commonly known as: NORVASC   lisinopril 40 MG tablet Commonly known as: ZESTRIL   pravastatin 80 MG tablet Commonly known as: PRAVACHOL       TAKE these medications    acetaminophen 325 MG tablet Commonly known as: TYLENOL Take 2 tablets (650 mg total) by mouth every 6 (six) hours as needed for mild pain (or Fever >/= 101).   allopurinol 100 MG tablet Commonly known as:  ZYLOPRIM TAKE ONE (1) TABLET EACH DAY What changed:  how much to take how to take this when to take this additional instructions   amoxicillin-clavulanate 875-125 MG tablet Commonly known as: Augmentin Take 1 tablet by mouth 2 (two) times daily for 7 days.   aspirin EC 81 MG tablet Take 81 mg by mouth daily.   colchicine 0.6 MG tablet Take twice daily for gout attack. (may take every two hours up to 6 doses at acute onset) What changed:  how much to take how to take this when to take this additional instructions   HYDROcodone-acetaminophen 5-325 MG tablet Commonly known as: NORCO/VICODIN Take 1-2 tablets by mouth every 4 (four) hours as needed for moderate pain.   ibuprofen 200 MG tablet Commonly known as: ADVIL Take 400-600 mg by mouth every 6 (six) hours as needed for moderate pain.        Day of Discharge BP 130/73 (BP Location: Left Arm)    Pulse 63    Temp 99.6 F (37.6 C) (Oral)    Resp 16    SpO2 95%   Physical Exam: General: No acute respiratory distress Lungs: Clear to auscultation bilaterally without wheezes or crackles Cardiovascular: Regular rate and rhythm without murmur gallop or rub normal S1 and S2 Abdomen: Only mildly tender to palpation in RUQ, nondistended, soft, bowel sounds positive, no rebound, no ascites, no appreciable mass - tube in place w/ no complications appreciable at insertion site  Extremities: No significant cyanosis, clubbing, or edema bilateral lower extremities  Basic Metabolic Panel: Recent Labs  Lab 10/13/21 1616 10/13/21 2328 10/14/21 0405 10/15/21 0408  NA 130* 131* 130* 131*  K 4.5 3.2* 3.0* 3.6  CL 92* 93* 95* 98  CO2 '24 23 23 24  ' GLUCOSE 163* 150* 140* 189*  BUN 57* 53* 50* 32*  CREATININE 3.06* 2.66* 2.10* 1.62*  CALCIUM 9.1 8.3* 8.2* 8.2*  MG  --  1.6* 1.6* 2.4  PHOS  --  3.2 3.3  --     Liver Function Tests: Recent Labs  Lab 10/13/21 1616 10/13/21 2328 10/14/21 0405 10/15/21 0408  AST 46* 40 39 31   ALT 39 42 41 37  ALKPHOS 77 78 79 79  BILITOT 0.9 0.7 0.7 0.5  PROT 7.9 7.0 6.9 6.9  ALBUMIN 3.6 3.2* 3.1* 2.9*   Recent Labs  Lab 10/13/21 1616  LIPASE 16    Coags: Recent Labs  Lab 10/13/21 1914  INR 1.1    CBC: Recent Labs  Lab 10/13/21 1616 10/14/21 0405 10/15/21 0408  WBC 15.8* 13.8* 10.5  NEUTROABS  --  11.4*  --   HGB 14.3 13.0 13.2  HCT 41.4 37.9* 39.1  MCV 86.6 90.5 92.4  PLT 219 180 195    Cardiac Enzymes: Recent Labs  Lab 10/13/21 1750 10/13/21 2328  CKTOTAL 300 289    Recent Results (from the past 240 hour(s))  Resp Panel by RT-PCR (Flu A&B, Covid) Nasopharyngeal Swab  Status: None   Collection Time: 10/13/21  5:50 PM   Specimen: Nasopharyngeal Swab; Nasopharyngeal(NP) swabs in vial transport medium  Result Value Ref Range Status   SARS Coronavirus 2 by RT PCR NEGATIVE NEGATIVE Final    Comment: (NOTE) SARS-CoV-2 target nucleic acids are NOT DETECTED.  The SARS-CoV-2 RNA is generally detectable in upper respiratory specimens during the acute phase of infection. The lowest concentration of SARS-CoV-2 viral copies this assay can detect is 138 copies/mL. A negative result does not preclude SARS-Cov-2 infection and should not be used as the sole basis for treatment or other patient management decisions. A negative result may occur with  improper specimen collection/handling, submission of specimen other than nasopharyngeal swab, presence of viral mutation(s) within the areas targeted by this assay, and inadequate number of viral copies(<138 copies/mL). A negative result must be combined with clinical observations, patient history, and epidemiological information. The expected result is Negative.  Fact Sheet for Patients:  EntrepreneurPulse.com.au  Fact Sheet for Healthcare Providers:  IncredibleEmployment.be  This test is no t yet approved or cleared by the [place] FDA and  has been authorized  for detection and/or diagnosis of SARS-CoV-2 by FDA under an Emergency Use Authorization (EUA). This EUA will remain  in effect (meaning this test can be used) for the duration of the COVID-19 declaration under Section 564(b)(1) of the Act, 21 U.S.C.section 360bbb-3(b)(1), unless the authorization is terminated  or revoked sooner.       Influenza A by PCR NEGATIVE NEGATIVE Final   Influenza B by PCR NEGATIVE NEGATIVE Final    Comment: (NOTE) The Xpert Xpress SARS-CoV-2/FLU/RSV plus assay is intended as an aid in the diagnosis of influenza from Nasopharyngeal swab specimens and should not be used as a sole basis for treatment. Nasal washings and aspirates are unacceptable for Xpert Xpress SARS-CoV-2/FLU/RSV testing.  Fact Sheet for Patients: EntrepreneurPulse.com.au  Fact Sheet for Healthcare Providers: IncredibleEmployment.be  This test is not yet approved or cleared by the [place] FDA and has been authorized for detection and/or diagnosis of SARS-CoV-2 by FDA under an Emergency Use Authorization (EUA). This EUA will remain in effect (meaning this test can be used) for the duration of the COVID-19 declaration under Section 564(b)(1) of the Act, 21 U.S.C. section 360bbb-3(b)(1), unless the authorization is terminated or revoked.  Performed at [facility], [address], [place], [place] [address]   Blood Culture (routine x 2)     Status: None (Preliminary result)   Collection Time: 10/13/21  7:25 PM   Specimen: BLOOD  Result Value Ref Range Status   Specimen Description   Final    BLOOD LEFT ANTECUBITAL Performed at [facility], [address] [place], [place] [address]    Special Requests   Final    Blood Culture adequate volume BOTTLES DRAWN AEROBIC AND ANAEROBIC Performed at [facility], [address], [place], [place] [address]    Culture   Final    NO GROWTH 2  DAYS Performed at [facility], [place] [address] [place], [place] [address]    Report Status PENDING  Incomplete  Blood Culture (routine x 2)     Status: None (Preliminary result)   Collection Time: 10/13/21  7:34 PM   Specimen: BLOOD  Result Value Ref Range Status   Specimen Description   Final    BLOOD BLOOD LEFT HAND Performed at [facility], [address], [place],  [address]    Special Requests  Final    BOTTLES DRAWN AEROBIC AND ANAEROBIC Blood Culture adequate volume Performed at [facility], [address], [place], [place] [address]    Culture   Final    NO GROWTH 2 DAYS Performed at [facility], [place] [address], [place], [place] [address]    Report Status PENDING  Incomplete  Culture, blood (x 2)     Status: None (Preliminary result)   Collection Time: 10/13/21 11:28 PM   Specimen: BLOOD  Result Value Ref Range Status   Specimen Description   Final    BLOOD BLOOD LEFT HAND Performed at [place] [address], [place], [place] [address]    Special Requests   Final    BOTTLES DRAWN AEROBIC ONLY Blood Culture adequate volume Performed at [facility] [address], [place], [place] [address]    Culture   Final    NO GROWTH 1 DAY Performed at [facility], [place] [address] [place], [place] [address]    Report Status PENDING  Incomplete  Culture, blood (x 2)     Status: None (Preliminary result)   Collection Time: 10/13/21 11:28 PM   Specimen: BLOOD  Result Value Ref Range Status   Specimen Description   Final    BLOOD BLOOD RIGHT ARM Performed at [place] [facility] [address], [place], [place] [address]    Special Requests   Final    BOTTLES DRAWN AEROBIC ONLY Blood Culture adequate volume Performed at [place] [address] [place], [place] [address]    Culture   Final    NO GROWTH 1 DAY Performed at  [facility], [place] [address] [place], [place] [address]    Report Status PENDING  Incomplete  Aerobic/Anaerobic Culture w Gram Stain (surgical/deep wound)     Status: None (Preliminary result)   Collection Time: 10/14/21  5:33 PM   Specimen: BILE  Result Value Ref Range Status   Specimen Description   Final    BILE Performed at [place] [address] [place], [place] [address]    Special Requests   Final    NONE Performed at [facility], [place] [address], [place], [place] [address]    Gram Stain NO WBC SEEN MODERATE GRAM NEGATIVE RODS   Final   Culture   Final    MODERATE GRAM NEGATIVE RODS CULTURE REINCUBATED FOR BETTER GROWTH Performed at [facility], [place] [address] [place],  [address]    Report Status PENDING  Incomplete      Time spent in discharge (includes decision making & examination of pt): 35 minutes  10/15/2021, 4:52 PM   Cherene Altes, MD [facility] Office  [phone]

## 2021-10-15 NOTE — Progress Notes (Addendum)
Referring Physician(s): Martin,M  Supervising Physician: Ruthann Cancer  Patient Status:  Bobby Ortiz - In-pt  Chief Complaint:  cholecystitis  Subjective: Pt doing ok this am; did have temp spike to 102 last night, currently 99.6; denies chills or worsening abd pain,N/V   Allergies: Patient has no known allergies.  Medications: Prior to Admission medications   Medication Sig Start Date End Date Taking? Authorizing Provider  allopurinol (ZYLOPRIM) 100 MG tablet TAKE ONE (1) TABLET EACH DAY Patient taking differently: Take 100 mg by mouth daily. 08/11/21  Yes Hawks, Christy A, FNP  amLODipine (NORVASC) 5 MG tablet TAKE ONE (1) TABLET BY MOUTH EVERY DAY Patient taking differently: Take 5 mg by mouth daily. 10/08/21  Yes Hawks, Christy A, FNP  aspirin EC 81 MG tablet Take 81 mg by mouth daily.   Yes [provider]  colchicine 0.6 MG tablet Take twice daily for gout attack. (may take every two hours up to 6 doses at acute onset) Patient taking differently: Take 0.6 mg by mouth See admin instructions. Take 0.6mg  by mouth twice daily for gout attack. (may take every two hours up to 6 doses at acute onset) 08/11/21  Yes Hawks, Christy A, FNP  ibuprofen (ADVIL,MOTRIN) 200 MG tablet Take 400-600 mg by mouth every 6 (six) hours as needed for moderate pain.   Yes [provider]  lisinopril (ZESTRIL) 40 MG tablet Take 1 tablet (40 mg total) by mouth daily. 08/11/21  Yes Hawks, Christy A, FNP  pravastatin (PRAVACHOL) 80 MG tablet Take 1 tablet (80 mg total) by mouth daily. Patient not taking: Reported on 10/13/2021 08/11/21   Sharion Balloon, FNP     Vital Signs: BP 130/73 (BP Location: Left Arm)    Pulse 63    Temp 99.6 F (37.6 C) (Oral)    Resp 16    SpO2 95%   Physical Exam awake/alert; 10 fr GB drain intact, to gravity bag; insertion site ok, not sig tender, OP 115 cc+ blood-tinged bile; drain flushed without difficulty  Imaging: CT ABDOMEN PELVIS WO  CONTRAST  Result Date: 10/13/2021 CLINICAL DATA:  Bowel obstruction suspected EXAM: CT ABDOMEN AND PELVIS WITHOUT CONTRAST TECHNIQUE: Multidetector CT imaging of the abdomen and pelvis was performed following the standard protocol without IV contrast. RADIATION DOSE REDUCTION: This exam was performed according to the departmental dose-optimization program which includes automated exposure control, adjustment of the mA and/or kV according to patient size and/or use of iterative reconstruction technique. COMPARISON:  None. FINDINGS: Lower chest: No acute abnormality. Hepatobiliary: No focal liver abnormality. Gallbladder wall thickening and pericholecystic fluid. Layering hyperdensity within gallbladder lumen may represent tiny gallstones versus gallbladder sludge. No biliary dilatation. Pancreas: No focal lesion. Normal pancreatic contour. No surrounding inflammatory changes. No main pancreatic ductal dilatation. Spleen: Normal in size without focal abnormality. Adrenals/Urinary Tract: No adrenal nodule bilaterally. No nephrolithiasis and no hydronephrosis. Parapelvic 4.1 x 3.5 cm fluid density lesion likely representing a parapelvic cyst. No ureterolithiasis or hydroureter. The urinary bladder is unremarkable. Stomach/Bowel: Stomach is within normal limits. Vague reactive changes of the duodenum. Otherwise no evidence of bowel wall thickening or dilatation. Status post appendectomy. Vascular/Lymphatic: No abdominal aorta or iliac aneurysm. Mild atherosclerotic plaque of the aorta and its branches. No abdominal, pelvic, or inguinal lymphadenopathy. Reproductive: Prostate is unremarkable. Other: No intraperitoneal free fluid. No intraperitoneal free gas. No organized fluid collection. Musculoskeletal: No abdominal wall hernia or abnormality. No suspicious lytic or blastic osseous lesions. No acute displaced fracture. Multilevel degenerative changes  of the spine. IMPRESSION: Cholelithiasis with acute cholecystitis.  Electronically Signed   By: Iven Finn M.D.   On: 10/13/2021 18:33   IR Perc Cholecystostomy  Result Date: 10/14/2021 INDICATION: 61 year old with abdominal pain and evidence for acute cholecystitis based on imaging. General Surgery recommend placement of a percutaneous cholecystostomy tube. EXAM: PLACEMENT OF PERCUTANEOUS CHOLECYSTOSTOMY TUBE WITH ULTRASOUND AND FLUOROSCOPIC GUIDANCE MEDICATIONS: Moderate sedation ANESTHESIA/SEDATION: Moderate (conscious) sedation was employed during this procedure. A total of Versed 3.0mg  and fentanyl 200 mcg was administered intravenously at the order of the provider performing the procedure. Total intra-service moderate sedation time: 20 minutes. Patient's level of consciousness and vital signs were monitored continuously by radiology nurse throughout the procedure under the supervision of the provider performing the procedure. FLUOROSCOPY TIME:  Fluoroscopy Time: 24 seconds, 25 mGy COMPLICATIONS: None immediate. PROCEDURE: Informed written consent was obtained from the patient after a thorough discussion of the procedural risks, benefits and alternatives. All questions were addressed. Maximal Sterile Barrier Technique was utilized including caps, mask, sterile gowns, sterile gloves, sterile drape, hand hygiene and skin antiseptic. A timeout was performed prior to the initiation of the procedure. Right side of the abdomen was prepped and draped in sterile fashion. Ultrasound demonstrated an abnormal gallbladder with diffuse wall thickening. The skin was anesthetized with 1% lidocaine. A small incision was made. Using ultrasound guidance, 21 gauge needle was directed towards the fundus of the gallbladder. The needle was placed directly into the gallbladder without traversing the liver. A 0.018 wire was advanced into the gallbladder and a transitional dilator set was placed. A superstiff Amplatz wire was placed and the tract was dilated to accommodate a 10.2 [place]  multipurpose drain. Greater than 80 mL of cloudy brown fluid was removed from the gallbladder. Sample of the fluid was sent for culture. Small amount of contrast was injected to confirm placement. Catheter was sutured to skin and the drain was attached to a gravity bag. FINDINGS: Abnormal gallbladder. Gallbladder wall appeared to be diffusely thickened and edematous. Drain was directed into the gallbladder fundus without traversing liver. 80 mL of brown fluid was removed from the gallbladder. Gallbladder was decompressed at the end of the procedure. IMPRESSION: Successful percutaneous cholecystostomy tube using ultrasound and fluoroscopic guidance. Electronically Signed   By: Markus Daft M.D.   On: 10/14/2021 19:02   DG Chest Port 1 View  Result Date: 10/13/2021 CLINICAL DATA:  Questionable sepsis. EXAM: PORTABLE CHEST 1 VIEW COMPARISON:  CT abdomen and pelvis 10/13/2021. FINDINGS: The heart size and mediastinal contours are within normal limits. Both lungs are clear. The visualized skeletal structures are unremarkable. IMPRESSION: No active disease. Electronically Signed   By: Ronney Asters M.D.   On: 10/13/2021 19:24   US Abdomen Limited RUQ (LIVER/GB)  Result Date: 10/14/2021 CLINICAL DATA:  61 year old Bobby Ortiz with nausea, vomiting. EXAM: ULTRASOUND ABDOMEN LIMITED RIGHT UPPER QUADRANT COMPARISON:  Noncontrast CT Abdomen and Pelvis yesterday. FINDINGS: Gallbladder: Diffuse heterogeneous gallbladder wall thickening and/or edema (images 4, 15). Wall thickness 10 mm or greater. No shadowing echogenic stones. Mild if any layering sludge. Sonographic Murphy sign not specified. Common bile duct: Diameter: 5-7 mm, not definitely dilated. Liver: Prominent hepatic veins suspected on image 63. No definite intrahepatic biliary ductal dilatation. Mildly echogenic liver. No discrete liver lesion. Portal vein is patent on color Doppler imaging with normal direction of blood flow towards the liver. Other: Negative visible  right kidney. IMPRESSION: 1. Severe gallbladder wall edema, but no cholelithiasis identified by ultrasound. Favor Acalculous Acute  Cholecystitis. 2. No definite bile duct obstruction. 3. Probable hepatic steatosis. Electronically Signed   By: Genevie Ann M.D.   On: 10/14/2021 09:03    Labs:  CBC: Recent Labs    10/13/21 1616 10/14/21 0405 10/15/21 0408  WBC 15.8* 13.8* 10.5  HGB 14.3 13.0 13.2  HCT 41.4 37.9* 39.1  PLT 219 180 195    COAGS: Recent Labs    10/13/21 1914  INR 1.1    BMP: Recent Labs    10/13/21 1616 10/13/21 2328 10/14/21 0405 10/15/21 0408  NA 130* 131* 130* 131*  K 4.5 3.2* 3.0* 3.6  CL 92* 93* 95* 98  CO2 24 23 23 24   GLUCOSE 163* 150* 140* 189*  BUN 57* 53* 50* 32*  CALCIUM 9.1 8.3* 8.2* 8.2*  CREATININE 3.06* 2.66* 2.10* 1.62*  GFRNONAA 23* 27* 35* 48*    LIVER FUNCTION TESTS: Recent Labs    10/13/21 1616 10/13/21 2328 10/14/21 0405 10/15/21 0408  BILITOT 0.9 0.7 0.7 0.5  AST 46* 40 39 31  ALT 39 42 41 37  ALKPHOS 77 78 79 79  PROT 7.9 7.0 6.9 6.9  ALBUMIN 3.6 3.2* 3.1* 2.9*    Assessment and Plan: Pt with hx acute cholecystitis; s/p perc GB drain 10/14/21; temp 99.6, CBC nl; bile cx pend; blood cx neg to date; creat 1.62(2.10); as OP rec once daily irrigation of drain with 5 cc sterile saline, OP recording and gauze dressing change every 2-3 days- drain care d/w pt/spouse; pt will be set up for f/u cholangiogram in 4-6 weeks; hydrate; check final cx results; call [phone] or [phone] and ask to speak with IR MD on call with any drain related questions   Electronically Signed: D. Rowe Robert, PA-C 10/15/2021, 11:00 AM   I spent a total of 15 Minutes at the the patient's bedside AND on the patient's hospital floor or unit, greater than 50% of which was counseling/coordinating care for gallbladder drain    Patient ID: Bobby Ortiz, Bobby Ortiz   DOB: Oct 23, 1960, 61 y.o.   MRN: [id number]

## 2021-10-15 NOTE — Progress Notes (Signed)
Subjective: Patient feels well today.  Pain improved.  Wanting to really go home today.  Tolerating solid diet.  Voiding well. Tmax 101 overnight  ROS: See above, otherwise other systems negative  Objective: Vital signs in last 24 hours: Temp:  [98.8 F (37.1 C)-101.1 F (38.4 C)] 99.6 F (37.6 C) (01/20 1003) Pulse Rate:  [63-105] 63 (01/20 1003) Resp:  [16-36] 16 (01/20 1003) BP: (103-179)/(73-111) 130/73 (01/20 1003) SpO2:  [85 %-100 %] 95 % (01/20 1003) Last BM Date: 10/13/21  Intake/Output from previous day: 01/19 0701 - 01/20 0700 In: 2870.6 [P.O.:960; I.V.:664.4; IV Piggyback:1246.1] Out: 2335 [Urine:2220; Drains:115] Intake/Output this shift: Total I/O In: 200.3 [I.V.:200.2; IV Piggyback:0.1] Out: 300 [Urine:300]  PE: Abd: soft, obese, less tender, perc chole drain with bilious bloody output, +BS  Lab Results:  Recent Labs    10/14/21 0405 10/15/21 0408  WBC 13.8* 10.5  HGB 13.0 13.2  HCT 37.9* 39.1  PLT 180 195   BMET Recent Labs    10/14/21 0405 10/15/21 0408  NA 130* 131*  K 3.0* 3.6  CL 95* 98  CO2 23 24  GLUCOSE 140* 189*  BUN 50* 32*  CREATININE 2.10* 1.62*  CALCIUM 8.2* 8.2*   PT/INR Recent Labs    10/13/21 1914  LABPROT 14.1  INR 1.1   CMP     Component Value Date/Time   NA 131 (L) 10/15/2021 0408   NA 135 07/16/2020 1200   K 3.6 10/15/2021 0408   CL 98 10/15/2021 0408   CO2 24 10/15/2021 0408   GLUCOSE 189 (H) 10/15/2021 0408   BUN 32 (H) 10/15/2021 0408   BUN 23 07/16/2020 1200   CREATININE 1.62 (H) 10/15/2021 0408   CALCIUM 8.2 (L) 10/15/2021 0408   PROT 6.9 10/15/2021 0408   PROT 7.3 07/07/2020 1624   ALBUMIN 2.9 (L) 10/15/2021 0408   ALBUMIN 4.7 07/07/2020 1624   AST 31 10/15/2021 0408   ALT 37 10/15/2021 0408   ALKPHOS 79 10/15/2021 0408   BILITOT 0.5 10/15/2021 0408   BILITOT <0.2 07/07/2020 1624   GFRNONAA 48 (L) 10/15/2021 0408   GFRAA 71 07/16/2020 1200   Lipase     Component Value Date/Time    LIPASE 16 10/13/2021 1616       Studies/Results: CT ABDOMEN PELVIS WO CONTRAST  Result Date: 10/13/2021 CLINICAL DATA:  Bowel obstruction suspected EXAM: CT ABDOMEN AND PELVIS WITHOUT CONTRAST TECHNIQUE: Multidetector CT imaging of the abdomen and pelvis was performed following the standard protocol without IV contrast. RADIATION DOSE REDUCTION: This exam was performed according to the departmental dose-optimization program which includes automated exposure control, adjustment of the mA and/or kV according to patient size and/or use of iterative reconstruction technique. COMPARISON:  None. FINDINGS: Lower chest: No acute abnormality. Hepatobiliary: No focal liver abnormality. Gallbladder wall thickening and pericholecystic fluid. Layering hyperdensity within gallbladder lumen may represent tiny gallstones versus gallbladder sludge. No biliary dilatation. Pancreas: No focal lesion. Normal pancreatic contour. No surrounding inflammatory changes. No main pancreatic ductal dilatation. Spleen: Normal in size without focal abnormality. Adrenals/Urinary Tract: No adrenal nodule bilaterally. No nephrolithiasis and no hydronephrosis. Parapelvic 4.1 x 3.5 cm fluid density lesion likely representing a parapelvic cyst. No ureterolithiasis or hydroureter. The urinary bladder is unremarkable. Stomach/Bowel: Stomach is within normal limits. Vague reactive changes of the duodenum. Otherwise no evidence of bowel wall thickening or dilatation. Status post appendectomy. Vascular/Lymphatic: No abdominal aorta or iliac aneurysm. Mild atherosclerotic plaque of the aorta and its  branches. No abdominal, pelvic, or inguinal lymphadenopathy. Reproductive: Prostate is unremarkable. Other: No intraperitoneal free fluid. No intraperitoneal free gas. No organized fluid collection. Musculoskeletal: No abdominal wall hernia or abnormality. No suspicious lytic or blastic osseous lesions. No acute displaced fracture. Multilevel degenerative  changes of the spine. IMPRESSION: Cholelithiasis with acute cholecystitis. Electronically Signed   By: Tish Frederickson M.D.   On: 10/13/2021 18:33   IR Perc Cholecystostomy  Result Date: 10/14/2021 INDICATION: 61 year old with abdominal pain and evidence for acute cholecystitis based on imaging. General Surgery recommend placement of a percutaneous cholecystostomy tube. EXAM: PLACEMENT OF PERCUTANEOUS CHOLECYSTOSTOMY TUBE WITH ULTRASOUND AND FLUOROSCOPIC GUIDANCE MEDICATIONS: Moderate sedation ANESTHESIA/SEDATION: Moderate (conscious) sedation was employed during this procedure. A total of Versed 3.0mg  and fentanyl 200 mcg was administered intravenously at the order of the provider performing the procedure. Total intra-service moderate sedation time: 20 minutes. Patient's level of consciousness and vital signs were monitored continuously by radiology nurse throughout the procedure under the supervision of the provider performing the procedure. FLUOROSCOPY TIME:  Fluoroscopy Time: 24 seconds, 25 mGy COMPLICATIONS: None immediate. PROCEDURE: Informed written consent was obtained from the patient after a thorough discussion of the procedural risks, benefits and alternatives. All questions were addressed. Maximal Sterile Barrier Technique was utilized including caps, mask, sterile gowns, sterile gloves, sterile drape, hand hygiene and skin antiseptic. A timeout was performed prior to the initiation of the procedure. Right side of the abdomen was prepped and draped in sterile fashion. Ultrasound demonstrated an abnormal gallbladder with diffuse wall thickening. The skin was anesthetized with 1% lidocaine. A small incision was made. Using ultrasound guidance, 21 gauge needle was directed towards the fundus of the gallbladder. The needle was placed directly into the gallbladder without traversing the liver. A 0.018 wire was advanced into the gallbladder and a transitional dilator set was placed. A superstiff Amplatz  wire was placed and the tract was dilated to accommodate a 10.2 [place] multipurpose drain. Greater than 80 mL of cloudy brown fluid was removed from the gallbladder. Sample of the fluid was sent for culture. Small amount of contrast was injected to confirm placement. Catheter was sutured to skin and the drain was attached to a gravity bag. FINDINGS: Abnormal gallbladder. Gallbladder wall appeared to be diffusely thickened and edematous. Drain was directed into the gallbladder fundus without traversing liver. 80 mL of brown fluid was removed from the gallbladder. Gallbladder was decompressed at the end of the procedure. IMPRESSION: Successful percutaneous cholecystostomy tube using ultrasound and fluoroscopic guidance. Electronically Signed   By: Richarda Overlie M.D.   On: 10/14/2021 19:02   DG Chest Port 1 View  Result Date: 10/13/2021 CLINICAL DATA:  Questionable sepsis. EXAM: PORTABLE CHEST 1 VIEW COMPARISON:  CT abdomen and pelvis 10/13/2021. FINDINGS: The heart size and mediastinal contours are within normal limits. Both lungs are clear. The visualized skeletal structures are unremarkable. IMPRESSION: No active disease. Electronically Signed   By: Darliss Cheney M.D.   On: 10/13/2021 19:24   US Abdomen Limited RUQ (LIVER/GB)  Result Date: 10/14/2021 CLINICAL DATA:  61 year old male with nausea, vomiting. EXAM: ULTRASOUND ABDOMEN LIMITED RIGHT UPPER QUADRANT COMPARISON:  Noncontrast CT Abdomen and Pelvis yesterday. FINDINGS: Gallbladder: Diffuse heterogeneous gallbladder wall thickening and/or edema (images 4, 15). Wall thickness 10 mm or greater. No shadowing echogenic stones. Mild if any layering sludge. Sonographic Murphy sign not specified. Common bile duct: Diameter: 5-7 mm, not definitely dilated. Liver: Prominent hepatic veins suspected on image 63. No definite intrahepatic biliary ductal dilatation.  Mildly echogenic liver. No discrete liver lesion. Portal vein is patent on color Doppler imaging with  normal direction of blood flow towards the liver. Other: Negative visible right kidney. IMPRESSION: 1. Severe gallbladder wall edema, but no cholelithiasis identified by ultrasound. Favor Acalculous Acute Cholecystitis. 2. No definite bile duct obstruction. 3. Probable hepatic steatosis. Electronically Signed   By: Odessa FlemingH  Hall M.D.   On: 10/14/2021 09:03    Anti-infectives: Anti-infectives (From admission, onward)    Start     Dose/Rate Route Frequency Ordered Stop   10/14/21 2000  cefTRIAXone (ROCEPHIN) 2 g in sodium chloride 0.9 % 100 mL IVPB        2 g 200 mL/hr over 30 Minutes Intravenous Every 24 hours 10/13/21 2249 10/21/21 1959   10/14/21 0800  metroNIDAZOLE (FLAGYL) IVPB 500 mg        500 mg 100 mL/hr over 60 Minutes Intravenous Every 12 hours 10/13/21 2249 10/21/21 0759   10/13/21 1915  cefTRIAXone (ROCEPHIN) 2 g in sodium chloride 0.9 % 100 mL IVPB  Status:  Discontinued        2 g 200 mL/hr over 30 Minutes Intravenous  Once 10/13/21 1905 10/13/21 1907   10/13/21 1915  cefTRIAXone (ROCEPHIN) 2 g in sodium chloride 0.9 % 100 mL IVPB  Status:  Discontinued        2 g 200 mL/hr over 30 Minutes Intravenous  Once 10/13/21 1907 10/14/21 1539   10/13/21 1915  metroNIDAZOLE (FLAGYL) IVPB 500 mg        500 mg 100 mL/hr over 60 Minutes Intravenous  Once 10/13/21 1907 10/13/21 2120        Assessment/Plan Acute cholecystitis -s/p perc chole drain 1/19 -WBC normalized -eating well -IR has discussed drain care with wife. -7 days total of abx therapy -patient really wanting to go home today.  Given his pain is improved, tolerating a diet, and WBC normalized, this is ok.  Fevers have dissipated today.  -D/W primary team -follow up with IR in 4-6 weeks and with Dr. Daphine DeutscherMartin in 5-6 weeks to discuss lap chole.   FEN - regular diet VTE - ok for chemical prophylaxis ID - Rocephin/Flagyl  AKI HTN  Moderate Medical Decision Making  LOS: 2 days    Letha CapeKelly E Suhaas Agena , [facility] 10/15/2021, 1:13 PM Please see Amion for pager number during day hours 7:00am-4:30pm or 7:00am -11:30am on weekends

## 2021-10-18 ENCOUNTER — Telehealth: Payer: Self-pay

## 2021-10-18 ENCOUNTER — Other Ambulatory Visit: Payer: Self-pay | Admitting: Surgery

## 2021-10-18 DIAGNOSIS — K819 Cholecystitis, unspecified: Secondary | ICD-10-CM

## 2021-10-18 LAB — CULTURE, BLOOD (ROUTINE X 2)
Culture: NO GROWTH
Culture: NO GROWTH
Special Requests: ADEQUATE
Special Requests: ADEQUATE

## 2021-10-18 NOTE — Telephone Encounter (Signed)
Transition Care Management Unsuccessful Follow-up Telephone Call  Date of discharge and from where:  10/15/21 - [facility] - cholecystitis with sepsis  Attempts:  1st Attempt  Reason for unsuccessful TCM follow-up call:  Left voice message

## 2021-10-19 LAB — CULTURE, BLOOD (ROUTINE X 2)
Culture: NO GROWTH
Culture: NO GROWTH
Special Requests: ADEQUATE
Special Requests: ADEQUATE

## 2021-10-19 LAB — AEROBIC/ANAEROBIC CULTURE W GRAM STAIN (SURGICAL/DEEP WOUND): Gram Stain: NONE SEEN

## 2021-10-19 NOTE — Telephone Encounter (Addendum)
Transition Care Management Unsuccessful Follow-up Telephone Call  Date of discharge and from where:  10/15/21 [facility] cholecystitis with sepsis  Attempts:  2nd Attempt  Reason for unsuccessful TCM follow-up call:  Left voice message on patient's wife's phone  Patient already has appt 10/22/21 @ 2:10 - I just need to ask him the TCM questionnaire    Transition Care Management Unsuccessful Follow-up Telephone Call  Date of discharge and from where:  10/15/21 [facility] cholecystitis with sepsis  Attempts:  3rd Attempt  Reason for unsuccessful TCM follow-up call:  Unable to reach patient after multiple attempts - still okay to do TCM

## 2021-10-22 ENCOUNTER — Ambulatory Visit: Payer: Commercial Managed Care - PPO | Admitting: Family

## 2021-10-22 ENCOUNTER — Encounter: Payer: Self-pay | Admitting: Family

## 2021-10-22 VITALS — BP 114/73 | HR 76 | Temp 97.4°F | Ht 75.0 in | Wt 334.4 lb

## 2021-10-22 DIAGNOSIS — K810 Acute cholecystitis: Secondary | ICD-10-CM

## 2021-10-22 DIAGNOSIS — I159 Secondary hypertension, unspecified: Secondary | ICD-10-CM | POA: Diagnosis not present

## 2021-10-22 DIAGNOSIS — A419 Sepsis, unspecified organism: Secondary | ICD-10-CM | POA: Diagnosis not present

## 2021-10-22 DIAGNOSIS — N179 Acute kidney failure, unspecified: Secondary | ICD-10-CM | POA: Diagnosis not present

## 2021-10-22 DIAGNOSIS — N1831 Chronic kidney disease, stage 3a: Secondary | ICD-10-CM

## 2021-10-22 DIAGNOSIS — Z09 Encounter for follow-up examination after completed treatment for conditions other than malignant neoplasm: Secondary | ICD-10-CM

## 2021-10-22 MED ORDER — LISINOPRIL 40 MG PO TABS: 40.0000 mg | ORAL_TABLET | Freq: Every day | ORAL | 3 refills | Status: DC

## 2021-10-22 MED ORDER — AMLODIPINE BESYLATE 5 MG PO TABS: 5.0000 mg | ORAL_TABLET | Freq: Every day | ORAL | 1 refills | Status: DC

## 2021-10-22 NOTE — Progress Notes (Signed)
Subjective:    Patient ID: Bobby Ortiz, male    DOB: [birth year], 61 y.o.   MRN: [id number]  Chief Complaint  Patient presents with   Medical Management of Chronic Issues   Pt presents to the office today for hospital follow up. He went to the ED on 10/13/21 with N7V and abdominal pain. CT abdomen and pelvis noted cholelithiasis. He was also hyponatremic and acute kidney injury.    He was given empiric antibiotics and had percutaneous cholecystostomy tube placed on 1/19. He was discharged on Augmentin. He is suppose to follow up with the surgeon 3-4 weeks for surgery.    Given sepsis and acute kidney injury his ACE and Norvasc was held. He states he monitor his BP at home and it was stable so he restarted these. Hypertension This is a chronic problem. The current episode started more than 1 year ago. The problem has been waxing and waning since onset. The problem is uncontrolled. Pertinent negatives include no malaise/fatigue, peripheral edema or shortness of breath. Risk factors for coronary artery disease include obesity and male gender. Past treatments include calcium channel blockers and ACE inhibitors. The current treatment provides mild improvement.     Review of Systems  Constitutional:  Negative for malaise/fatigue.  Respiratory:  Negative for shortness of breath.   All other systems reviewed and are negative.     Objective:   Physical Exam Vitals reviewed.  Constitutional:      General: He is not in acute distress.    Appearance: He is well-developed. He is obese.  HENT:     Head: Normocephalic.     Right Ear: Tympanic membrane normal.     Left Ear: Tympanic membrane normal.  Eyes:     General:        Right eye: No discharge.        Left eye: No discharge.     Pupils: Pupils are equal, round, and reactive to light.  Neck:     Thyroid: No thyromegaly.  Cardiovascular:     Rate and Rhythm: Normal rate and regular rhythm.     Heart sounds: Normal heart sounds. No  murmur heard. Pulmonary:     Effort: Pulmonary effort is normal. No respiratory distress.     Breath sounds: Normal breath sounds. No wheezing.  Abdominal:     General: Bowel sounds are normal. There is no distension.     Palpations: Abdomen is soft.     Tenderness: There is abdominal tenderness (RUQ tenderness).     Comments: Drain in place, slight erythemas around site. No discharge.   Musculoskeletal:        General: No tenderness. Normal range of motion.     Cervical back: Normal range of motion and neck supple.  Skin:    General: Skin is warm and dry.     Findings: No erythema or rash.  Neurological:     Mental Status: He is alert and oriented to person, place, and time.     Cranial Nerves: No cranial nerve deficit.     Deep Tendon Reflexes: Reflexes are normal and symmetric.  Psychiatric:        Behavior: Behavior normal.        Thought Content: Thought content normal.        Judgment: Judgment normal.      BP 133/76    Pulse 76    Temp (!) 97.4 F (36.3 C) (Temporal)    Ht _0  (1.905 m)  Wt (!) 334 lb 6.4 oz (151.7 kg)    BMI 41.80 kg/m      Assessment & Plan:  Bobby Ortiz comes in today with chief complaint of Transitions Of Care   Diagnosis and orders addressed:  1. Secondary hypertension Continue BP medications  -Dash diet information given -Exercise encouraged - Stress Management  -Continue current meds - amLODipine (NORVASC) 5 MG tablet; Take 1 tablet (5 mg total) by mouth daily.  Dispense: 90 tablet; Refill: 1 - lisinopril (ZESTRIL) 40 MG tablet; Take 1 tablet (40 mg total) by mouth daily.  Dispense: 90 tablet; Refill: 3 - CMP14+EGFR - CBC with Differential/Platelet  2. Hospital discharge follow-up - CMP14+EGFR - CBC with Differential/Platelet  3. Acute cholecystitis Continue Augmentin Keep follow up with Surgeon  - CMP14+EGFR - CBC with Differential/Platelet  4. AKI (acute kidney injury) ([place]) Labs pending  - CMP14+EGFR - CBC with  Differential/Platelet  5. Sepsis without acute organ dysfunction, due to unspecified organism ([place]) - CMP14+EGFR - CBC with Differential/Platelet  6. Stage 3a chronic kidney disease (HCC) - CMP14+EGFR - CBC with Differential/Platelet  7. Morbid obesity ([place]) - CMP14+EGFR - CBC with Differential/Platelet   Labs pending Health Maintenance reviewed Diet and exercise encouraged  Follow up plan: Keep chronic follow up    Evelina Dun, FNP

## 2021-10-22 NOTE — Patient Instructions (Signed)
Cholecystitis °Cholecystitis is inflammation of the gallbladder. Cholecystitis is often called a gallbladder attack. The gallbladder is a pear-shaped organ that lies beneath the liver on the right side of the body. The gallbladder stores a fluid that helps the body digest fats (bile). If bile builds up in your gallbladder, your gallbladder becomes inflamed and can develop a serious infection. °This condition may occur suddenly. Cholecystitis is a serious condition and requires treatment. °What are the causes? °The most common cause of this condition is gallstones. Gallstones can block the tube (duct) that carries bile out of your gallbladder. This causes bile to build up. °Other causes include: °Damage to the gallbladder due to decreased blood flow. °Infection in the bile duct. °Scars, kinks, or adhesions in the bile duct. °Tumors in the liver, pancreas, or gallbladder. °What increases the risk? °You are more likely to develop this condition if: °You are male and between the ages of 55-62. °You take birth control pills or use estrogen. °You take certain medicines that increase your likelihood of developing gallstones. °You are obese. °You have a severe reaction to an infection (sepsis). The infection may be bacterial, fungal, parasitic, or viral. °You have been hospitalized due to trauma, such as a burn or critical illness. °You have not eaten or drank for a long period of time (prolonged fasting). °What are the signs or symptoms? °Symptoms of this condition include: °Tenderness in the upper right part of the abdomen. °A lump over the gallbladder. °Bloating in the abdomen. °Nausea. °Vomiting. °Fever. °Chills. °How is this diagnosed? °This condition is diagnosed with a medical history and physical exam. You may also have other tests, including: °Imaging tests, such as: °An ultrasound of the abdomen. °A CT scan of the abdomen. °A gallbladder nuclear scan (HIDA cholescintigraphy). This allows your health care  provider to see the bile moving from your liver to your gallbladder and to your small intestine. °MRI. °Blood tests, such as: °A complete blood count. The white blood cell count may be higher than normal. °C-reactive protein (CRP) test. The level of CRP will be higher if there is an infection. °Liver function tests. Certain types of gallstones cause some results to be higher than normal. °How is this treated? °Treatment may include: °Pain medicine and IV fluids. °Not eating or drinking (fasting). This helps to take stress off of your gallbladder. °Antibiotic medicine. This is usually given through an IV. °Surgery to remove your gallbladder (cholecystectomy). °Gallbladder drainage. In this procedure, a tube is placed into the gallbladder to drain fluid. This may be done for people with moderate to severe cholecystitis who cannot have surgery. °Follow these instructions at home: °Medicines ° °Take over-the-counter and prescription medicines only as told by your health care provider. °If you were prescribed an antibiotic medicine, take it as told by your health care provider. Do not stop taking the antibiotic even if you start to feel better. °General instructions °Follow instructions from your health care provider about what to eat or drink. When you are allowed to eat, avoid eating or drinking anything that triggers your symptoms. °Do not use any products that contain nicotine or tobacco. These products include cigarettes, chewing tobacco, and vaping devices, such as e-cigarettes. If you need help quitting, ask your health care provider. °Keep all follow-up visits. This is important. °Contact a health care provider if: °Your pain is not controlled with medicine. °You have a fever. °Get help right away if: °Your pain moves to another part of your abdomen or to your   back. °You continue to have symptoms or you develop new symptoms even with treatment. °These symptoms may be an emergency. Get help right away. Call  911. °Do not wait to see if the symptoms will go away. °Do not drive yourself to the hospital. °Summary °Cholecystitis is inflammation of the gallbladder. °The most common cause of this condition is gallstones. Gallstones can block the tube (duct) that carries bile out of your gallbladder. °Common symptoms include tenderness in the abdomen, nausea, vomiting, fever, and chills. °This condition is treated with fasting, pain medicine, surgery to remove the gallbladder, antibiotic medicines, and gallbladder drainage. °Follow your health care provider's instructions for eating and drinking. Avoid eating anything that triggers your symptoms. °This information is not intended to replace advice given to you by your health care provider. Make sure you discuss any questions you have with your health care provider. °Document Revised: 03/16/2021 Document Reviewed: 03/16/2021 °Elsevier Patient Education © 2022 [employer] ° °

## 2021-10-23 LAB — CBC WITH DIFFERENTIAL/PLATELET
Basophils Absolute: 0.1 10*3/uL (ref 0.0–0.2)
Basos: 1 %
EOS (ABSOLUTE): 0.1 10*3/uL (ref 0.0–0.4)
Eos: 2 %
Hematocrit: 40.2 % (ref 37.5–51.0)
Hemoglobin: 13.9 g/dL (ref 13.0–17.7)
Immature Grans (Abs): 0.1 10*3/uL (ref 0.0–0.1)
Immature Granulocytes: 2 %
Lymphocytes Absolute: 2.3 10*3/uL (ref 0.7–3.1)
Lymphs: 37 %
MCH: 30.5 pg (ref 26.6–33.0)
MCHC: 34.6 g/dL (ref 31.5–35.7)
MCV: 88 fL (ref 79–97)
Monocytes Absolute: 0.4 10*3/uL (ref 0.1–0.9)
Monocytes: 6 %
Neutrophils Absolute: 3.3 10*3/uL (ref 1.4–7.0)
Neutrophils: 52 %
Platelets: 458 10*3/uL — ABNORMAL HIGH (ref 150–450)
RBC: 4.56 x10E6/uL (ref 4.14–5.80)
RDW: 12.7 % (ref 11.6–15.4)
WBC: 6.2 10*3/uL (ref 3.4–10.8)

## 2021-10-23 LAB — CMP14+EGFR
ALT: 31 IU/L (ref 0–44)
AST: 18 IU/L (ref 0–40)
Albumin/Globulin Ratio: 1.2 (ref 1.2–2.2)
Albumin: 3.8 g/dL (ref 3.8–4.9)
Alkaline Phosphatase: 120 IU/L (ref 44–121)
BUN/Creatinine Ratio: 13 (ref 10–24)
BUN: 17 mg/dL (ref 8–27)
Bilirubin Total: <0.2 mg/dL (ref 0.0–1.2)
CO2: 23 mmol/L (ref 20–29)
Calcium: 9.2 mg/dL (ref 8.6–10.2)
Chloride: 101 mmol/L (ref 96–106)
Creatinine, Ser: 1.27 mg/dL (ref 0.76–1.27)
Globulin, Total: 3.2 g/dL (ref 1.5–4.5)
Glucose: 97 mg/dL (ref 70–99)
Potassium: 4.7 mmol/L (ref 3.5–5.2)
Sodium: 137 mmol/L (ref 134–144)
Total Protein: 7 g/dL (ref 6.0–8.5)
eGFR: 65 mL/min/{1.73_m2} (ref >59)

## 2021-11-18 ENCOUNTER — Ambulatory Visit
Admission: RE | Admit: 2021-11-18 | Discharge: 2021-11-18 | Disposition: A | Discharge status: Home / Self Care | Payer: Commercial Managed Care - PPO | Source: Ambulatory Visit | Attending: Surgery | Admitting: Surgery

## 2021-11-18 ENCOUNTER — Other Ambulatory Visit: Payer: Self-pay | Admitting: Surgery

## 2021-11-18 DIAGNOSIS — K819 Cholecystitis, unspecified: Secondary | ICD-10-CM

## 2021-11-18 HISTORY — PX: IR RADIOLOGIST EVAL & MGMT: IMG5224

## 2021-11-18 NOTE — Progress Notes (Signed)
Referring Physician(s): Martin,Matthew  Chief Complaint: The patient is seen in follow up today s/p percutanerous cholecystotomy drain  History of present illness:  Bobby Ortiz was discharged from [facility] on 10/15/21 after being treated for acute cholecystitis with Sepsis.  A drain was placed in the gallbladder by IR on 10/14/21.  His wife helps with drain care at home.  They are flushing once daily and recording OP which has averaged 500-716mL each day.  He is not running a fever and has finished his antibiotic.  He does not currently have a surgical date, but believes surgery is the planned definitive care at this time.  Past Medical History:  Diagnosis Date   Hypertension     Past Surgical History:  Procedure Laterality Date   APPENDECTOMY     IR PERC CHOLECYSTOSTOMY  10/14/2021   IRRIGATION AND DEBRIDEMENT ABSCESS Right 07/25/2014   Procedure: IRRIGATION AND DEBRIDEMENT ABSCESS RIGHT GROIN;  Surgeon: Garnett Farm, MD;  Location: [facility] OR;  Service: Urology;  Laterality: Right;    Allergies: Patient has no known allergies.  Medications: Prior to Admission medications   Medication Sig Start Date End Date Taking? Authorizing Provider  acetaminophen (TYLENOL) 325 MG tablet Take 2 tablets (650 mg total) by mouth every 6 (six) hours as needed for mild pain (or Fever >/= 101). 10/15/21   Lonia Blood, MD  allopurinol (ZYLOPRIM) 100 MG tablet TAKE ONE (1) TABLET EACH DAY Patient taking differently: Take 100 mg by mouth daily. 08/11/21   Jannifer Rodney A, FNP  amLODipine (NORVASC) 5 MG tablet Take 1 tablet (5 mg total) by mouth daily. 10/22/21   Jannifer Rodney A, FNP  aspirin EC 81 MG tablet Take 81 mg by mouth daily.    [provider]  colchicine 0.6 MG tablet Take twice daily for gout attack. (may take every two hours up to 6 doses at acute onset) Patient taking differently: Take 0.6 mg by mouth See admin instructions. Take 0.6mg  by mouth twice daily for gout attack. (may  take every two hours up to 6 doses at acute onset) 08/11/21   Junie Spencer, FNP  HYDROcodone-acetaminophen (NORCO/VICODIN) 5-325 MG tablet Take 1-2 tablets by mouth every 4 (four) hours as needed for moderate pain. 10/15/21   Barnetta Chapel, PA-C  ibuprofen (ADVIL,MOTRIN) 200 MG tablet Take 400-600 mg by mouth every 6 (six) hours as needed for moderate pain.    [provider]  lisinopril (ZESTRIL) 40 MG tablet Take 1 tablet (40 mg total) by mouth daily. 10/22/21   Junie Spencer, FNP     Family History  Problem Relation Age of Onset   Heart disease Mother        Stents   Cancer Mother        Stomach   Heart disease Father        Heart attack    Social History   Socioeconomic History   Marital status: Married    Spouse name: Not on file   Number of children: Not on file   Years of education: Not on file   Highest education level: Not on file  Occupational History   Not on file  Tobacco Use   Smoking status: Never   Smokeless tobacco: Never  Vaping Use   Vaping Use: Never used  Substance and Sexual Activity   Alcohol use: No   Drug use: No   Sexual activity: Not on file  Other Topics Concern   Not on file  Social  History Narrative   Not on file   Social Determinants of Health   Financial Resource Strain: Not on file  Food Insecurity: Not on file  Transportation Needs: Not on file  Physical Activity: Not on file  Stress: Not on file  Social Connections: Not on file     Vital Signs: There were no vitals taken for this visit.  Physical Exam Constitutional:      General: He is not in acute distress.    Appearance: Normal appearance. He is not ill-appearing.  HENT:     Head: Normocephalic and atraumatic.     Mouth/Throat:     Pharynx: Oropharynx is clear.  Eyes:     Extraocular Movements: Extraocular movements intact.     Conjunctiva/sclera: Conjunctivae normal.  Pulmonary:     Effort: Pulmonary effort is normal. No respiratory distress.   Abdominal:     Palpations: Abdomen is soft.     Tenderness: There is no abdominal tenderness.  Skin:    General: Skin is warm and dry.     Capillary Refill: Capillary refill takes less than 2 seconds.  Neurological:     General: No focal deficit present.     Mental Status: He is alert and oriented to person, place, and time.  Psychiatric:        Mood and Affect: Mood normal.        Behavior: Behavior normal.  Drain: suture in tact, site clean and dry.  250cc bilious fluid in drain bag.    Imaging: Contrast injection under fluoro demonstrates gallbladder, cystic duct, common bile duct, duodenum, and minimal intrahepatics, with no filling defects or stricture apparent.  Labs:  CBC: Recent Labs    10/13/21 1616 10/14/21 0405 10/15/21 0408 10/22/21 1415  WBC 15.8* 13.8* 10.5 6.2  HGB 14.3 13.0 13.2 13.9  HCT 41.4 37.9* 39.1 40.2  PLT 219 180 195 458*    COAGS: Recent Labs    10/13/21 1914  INR 1.1    BMP: Recent Labs    10/13/21 1616 10/13/21 2328 10/14/21 0405 10/15/21 0408 10/22/21 1415  NA 130* 131* 130* 131* 137  K 4.5 3.2* 3.0* 3.6 4.7  CL 92* 93* 95* 98 101  CO2 24 23 23 24 23   GLUCOSE 163* 150* 140* 189* 97  BUN 57* 53* 50* 32* 17  CALCIUM 9.1 8.3* 8.2* 8.2* 9.2  CREATININE 3.06* 2.66* 2.10* 1.62* 1.27  GFRNONAA 23* 27* 35* 48*  --     LIVER FUNCTION TESTS: Recent Labs    10/13/21 2328 10/14/21 0405 10/15/21 0408 10/22/21 1415  BILITOT 0.7 0.7 0.5 <0.2  AST 40 39 31 18  ALT 42 41 37 31  ALKPHOS 78 79 79 120  PROT 7.0 6.9 6.9 7.0  ALBUMIN 3.2* 3.1* 2.9* 3.8    Assessment:  Acute cholecystitis --chole drain 10/15/2021 --draining well, biliary system patent --catheter capped, return to clinic for follow up in 2-4 weeks --extra drain bag given with instructions on when to reattach   Signed: 10/17/2021, PA 11/18/2021, 2:29 PM   Please refer to Dr. 11/20/2021 attestation of this note for management and plan.

## 2021-12-08 ENCOUNTER — Other Ambulatory Visit: Payer: Commercial Managed Care - PPO

## 2021-12-14 ENCOUNTER — Other Ambulatory Visit: Payer: Commercial Managed Care - PPO

## 2021-12-15 ENCOUNTER — Encounter (HOSPITAL_COMMUNITY): Payer: Self-pay | Admitting: Surgery

## 2021-12-20 NOTE — Progress Notes (Signed)
Surgery orders requested with Dr. Martin's office. °

## 2021-12-21 ENCOUNTER — Other Ambulatory Visit: Payer: Self-pay

## 2021-12-21 ENCOUNTER — Encounter (HOSPITAL_COMMUNITY): Payer: Self-pay | Admitting: Surgery

## 2021-12-21 NOTE — Progress Notes (Addendum)
COVID swab appointment: N/A ? ?COVID Vaccine Completed:  Yes x2 ?Date COVID Vaccine completed: ?Has received booster: No ?COVID vaccine manufacturer: [employer]   ? ?Date of COVID positive in last 90 days:  No ? ?PCP - Evelina Dun, [facility]  ?Cardiologist - N/A ? ?Chest x-ray - 10-13-21 Epic ?EKG - 10-18-21 Epic ?Stress Test - 20 years ago after shoulder injury ?ECHO - N/A ?Cardiac Cath - N/A ?Pacemaker/ICD device last checked: ?Spinal Cord Stimulator: ? ?Bowel Prep - N/A ? ?Sleep Study - Yes, neg sleep apnea ?CPAP - N/A ? ?Fasting Blood Sugar - N/A ?Checks Blood Sugar _____ times a day ? ?Blood Thinner Instructions:   ?Aspirin Instructions:  ASA 81 mg .  Patient to stop one week prior ?Last Dose: ? ?Activity level:   Can go up a flight of stairs and perform activities of daily living without stopping and without symptoms of chest pain or shortness of breath. ?  ?Anesthesia review:  Stop Bang 6 ? ?Patient denies shortness of breath, fever, cough and chest pain at PAT appointment (completed over the phone) ? ?Patient verbalized understanding of instructions that were given to them at the PAT appointment. Patient was also instructed that they will need to review over the PAT instructions again at home before surgery.  ?

## 2021-12-21 NOTE — Patient Instructions (Signed)
DUE TO COVID-19 TWO VISITORS  (aged 61 and older)  IS ALLOWED TO COME WITH YOU AND STAY IN THE WAITING ROOM ONLY DURING PRE OP AND PROCEDURE.   ?**NO VISITORS ARE ALLOWED IN THE SHORT STAY AREA OR RECOVERY ROOM!!** ? ?You are not required to quarantine, however you are required to wear a well-fitted mask when you are out and around people not in your household.  ?Hand Hygiene often ?Do NOT share personal items ?Notify your provider if you are in close contact with someone who has COVID or you develop fever 100.4 or greater, new onset of sneezing, cough, sore throat, shortness of breath or body aches. ? ?     ? Your procedure is scheduled on: Tuesday, 12-28-21 ? ? Report to [facility] Main Entrance ? ?  Report to admitting at 10:45 AM ? ? Call this number if you have problems the morning of surgery [phone] ? ? Do not eat food :After Midnight. ? ? After Midnight you may have the following liquids until 10:00 AM DAY OF SURGERY ? ?Water ?Black Coffee (sugar ok, NO MILK/CREAM OR CREAMERS)  ?Tea (sugar ok, NO MILK/CREAM OR CREAMERS) regular and decaf                             ?Plain Jell-O (NO RED)                                           ?Fruit ices (not with fruit pulp, NO RED)                                     ?Popsicles (NO RED)                                                                  ?Juice: apple, WHITE grape, WHITE cranberry ?Sports drinks like Gatorade (NO RED) ?Clear broth(vegetable,chicken,beef)        ? ? ?FOLLOW ANY ADDITIONAL PRE OP INSTRUCTIONS YOU RECEIVED FROM YOUR SURGEON'S OFFICE!!! ?  ?  ?Oral Hygiene is also important to reduce your risk of infection.                                    ?Remember - BRUSH YOUR TEETH THE MORNING OF SURGERY WITH YOUR REGULAR TOOTHPASTE ? ? Do NOT smoke after Midnight ? ?Take these medicines the morning of surgery with A SIP OF WATER: Tylenol, Allopurinol, Amlodipine, Colchine.   Hydrocodone if needed ? ?Stop Aspirin one week prior to surgery  ?                   ?           You may not have any metal on your body including  jewelry, and body piercing ? ?           Do not wear lotions, powders, cologne, or deodorant ? ?  Men may shave face and neck. ? ? Do not bring valuables to the hospital. [facility] IS NOT RESPONSIBLE   FOR VALUABLES. ? ? Contacts, dentures or bridgework may not be worn into surgery. ?   ?Patients discharged on the day of surgery will not be allowed to drive home.  Someone NEEDS to stay with you for the first 24 hours after anesthesia. ? ?Please read over the following fact sheets you were given: IF YOU HAVE QUESTIONS ABOUT YOUR PRE-OP INSTRUCTIONS PLEASE CALL [phone] ? ?[place] - Preparing for Surgery ?Before surgery, you can play an important role.  Because skin is not sterile, your skin needs to be as free of germs as possible.  You can reduce the number of germs on your skin by washing with CHG (chlorahexidine gluconate) soap before surgery.  CHG is an antiseptic cleaner which kills germs and bonds with the skin to continue killing germs even after washing. ?Please DO NOT use if you have an allergy to CHG or antibacterial soaps.  If your skin becomes reddened/irritated stop using the CHG and inform your nurse when you arrive at Short Stay. ?Do not shave (including legs and underarms) for at least 48 hours prior to the first CHG shower.  You may shave your face/neck. ? ?Please follow these instructions carefully: Forensic scientist with an antibacterial soap) ? 1.  Shower with CHG Soap the night before surgery and the  morning of surgery. ? 2.  If you choose to wash your hair, wash your hair first as usual with your normal  shampoo. ? 3.  After you shampoo, rinse your hair and body thoroughly to remove the shampoo.                            ? 4.  Use CHG as you would any other liquid soap.  You can apply chg directly to the skin and wash.  Gently with a scrungie or clean washcloth. ? 5.  Apply the CHG Soap to your body ONLY  FROM THE NECK DOWN.   Do   not use on face/ open      ?                     Wound or open sores. Avoid contact with eyes, ears mouth and   genitals (private parts).  ?                     Engineering geologist,  Genitals (private parts) with your normal soap. ?            6.  Wash thoroughly, paying special attention to the area where your    surgery  will be performed. ? 7.  Thoroughly rinse your body with warm water from the neck down. ? 8.  DO NOT shower/wash with your normal soap after using and rinsing off the CHG Soap. ?               9.  Pat yourself dry with a clean towel. ?           10.  Wear clean pajamas. ?           11.  Place clean sheets on your bed the night of your first shower and do not  sleep with pets. ?Day of Surgery : ?Do not apply any lotions/deodorants the morning of surgery.  Please wear clean clothes to the hospital/surgery  center. ? ?FAILURE TO FOLLOW THESE INSTRUCTIONS MAY RESULT IN THE CANCELLATION OF YOUR SURGERY ? ?PATIENT SIGNATURE_________________________________ ? ?NURSE SIGNATURE__________________________________ ? ?________________________________________________________________________  ?  ?

## 2021-12-24 ENCOUNTER — Encounter (HOSPITAL_COMMUNITY): Payer: Commercial Managed Care - PPO

## 2021-12-27 NOTE — H&P (Signed)
?REFERRING PHYSICIAN: Junie Spencer, NP ? ?PROVIDER: Katha Cabal, MD ? ?MRN: [id number] ?DOB: October 19, 1960 ? ? ?Subjective  ? ?Chief Complaint: New Consultation (Gallbladder) ? ? ?History of Present Illness: ?Bobby Ortiz is a 61 y.o. male who is seen today as an office consultation at the request of Dr. Lendon Ortiz for evaluation of New Consultation (Gallbladder) ?.  ? ?Mr. And Bobby Ortiz presented to the office to discuss a tube in his gallbladder. He was an inpatient back in January and had acute cholecystitis with thick-walled gallbladder which she did have for several days. He was treated with percutaneous tube. Subsequent cholangiograms have not shown any stones and do show flow into the common bile duct and into the duodenum. He has had a prior open appendectomy by Dr. Carla Ortiz in [place] in the late 60s. He also had an umbilical hernia repair by Dr. Corliss Ortiz. He actually feels better with his tube capped off. ? ?I gave him a booklet on gallbladder surgery and explained the nature of the and risk of the operation. 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 ? ?Review of Systems: ?See HPI as well for other ROS. ? ?ROS  ? ?Medical History: ?Past Medical History:  ?Diagnosis Date  ? Hypertension  ? ?There is no problem list on file for this patient. ? ?History reviewed. No pertinent surgical history.  ? ?No Known Allergies ? ?Current Outpatient Medications on File Prior to Visit  ?Medication Sig Dispense Refill  ? amLODIPine (NORVASC) 5 MG tablet Take 5 mg by mouth once daily  ? lisinopriL (ZESTRIL) 40 MG tablet Take 40 mg by mouth once daily  ? ?No current facility-administered medications on file prior to visit.  ? ?Family History  ?Problem Relation Age of Onset  ? High blood pressure (Hypertension) Mother  ? Hyperlipidemia (Elevated cholesterol) Mother  ? Coronary Artery Disease (Blocked arteries around heart) Mother  ? Colon cancer Mother  ?  Coronary Artery Disease (Blocked arteries around heart) Father  ? ? ?Social History  ? ?Tobacco Use  ?Smoking Status Never  ?Smokeless Tobacco Never  ? ? ?Social History  ? ?Socioeconomic History  ? Marital status: Married  ?Tobacco Use  ? Smoking status: Never  ? Smokeless tobacco: Never  ?Vaping Use  ? Vaping Use: Never used  ?Substance and Sexual Activity  ? Alcohol use: Not Currently  ? Drug use: Never  ? ?Objective:  ? ?Vitals:  ?12/08/21 1352  ?BP: (!) 150/80  ?Pulse: 62  ?SpO2: 95%  ?Weight: (!) 148.1 kg (326 lb 9.6 oz)  ?Height: 190.5 cm (6\' 3" )  ? ?Body mass index is 40.82 kg/m?. ? ?Physical Exam ?General: Slightly obese white male no acute distress ?HEENT : Unremarkable ?Chest: Clear ?Heart: Sinus rhythm ?Breast: Not examined ?Abdomen: Right upper quadrant tube is capped off. Right paramedian incision in the right lower quadrant from appendectomy and a transverse incision at the umbilicus from his umbilical hernia repair ?GU not examined ?Rectal not performed ?Extremities full range of motion ?Neuro alert and oriented x3. Motor and sensory function grossly intact ? ?Labs, Imaging and Diagnostic Testing: ?Recent cholangiogram report reviewed showing no stones and free flow into the duodenum ? ?Assessment and Plan:  ? ?Calculus of gallbladder with acute cholecystitis without obstruction ? ? ? ?We will schedule for laparoscopic cholecystectomy since port placement may be an issue. I mention that to him that because of his previous  surgery and the nature of his thick-walled gallbladder might preclude normal laparoscopic cholecystectomy. ? ? ?Bobby Ortiz Charna Busman, MD  ?

## 2021-12-28 ENCOUNTER — Other Ambulatory Visit: Payer: Self-pay

## 2021-12-28 ENCOUNTER — Ambulatory Visit (HOSPITAL_COMMUNITY): Payer: Commercial Managed Care - PPO | Admitting: Anesthesiology

## 2021-12-28 ENCOUNTER — Ambulatory Visit (HOSPITAL_COMMUNITY)
Admission: RE | Admit: 2021-12-28 | Discharge: 2021-12-28 | Disposition: A | Discharge status: Home / Self Care | Payer: Commercial Managed Care - PPO | Attending: Surgery | Admitting: Surgery

## 2021-12-28 ENCOUNTER — Encounter (HOSPITAL_COMMUNITY): Payer: Self-pay | Admitting: Surgery

## 2021-12-28 ENCOUNTER — Ambulatory Visit (HOSPITAL_COMMUNITY): Payer: Commercial Managed Care - PPO

## 2021-12-28 ENCOUNTER — Ambulatory Visit (HOSPITAL_BASED_OUTPATIENT_CLINIC_OR_DEPARTMENT_OTHER): Payer: Commercial Managed Care - PPO | Admitting: Anesthesiology

## 2021-12-28 ENCOUNTER — Encounter (HOSPITAL_COMMUNITY)
Admission: RE | Disposition: A | Discharge status: Home / Self Care | Payer: Self-pay | Source: Home / Self Care | Attending: Surgery

## 2021-12-28 DIAGNOSIS — K8010 Calculus of gallbladder with chronic cholecystitis without obstruction: Secondary | ICD-10-CM | POA: Diagnosis not present

## 2021-12-28 DIAGNOSIS — M1990 Unspecified osteoarthritis, unspecified site: Secondary | ICD-10-CM | POA: Diagnosis not present

## 2021-12-28 DIAGNOSIS — I2510 Atherosclerotic heart disease of native coronary artery without angina pectoris: Secondary | ICD-10-CM

## 2021-12-28 DIAGNOSIS — I10 Essential (primary) hypertension: Secondary | ICD-10-CM | POA: Diagnosis not present

## 2021-12-28 DIAGNOSIS — K810 Acute cholecystitis: Secondary | ICD-10-CM | POA: Diagnosis present

## 2021-12-28 DIAGNOSIS — T884XXA Failed or difficult intubation, initial encounter: Secondary | ICD-10-CM

## 2021-12-28 DIAGNOSIS — Z9049 Acquired absence of other specified parts of digestive tract: Secondary | ICD-10-CM | POA: Insufficient documentation

## 2021-12-28 DIAGNOSIS — K819 Cholecystitis, unspecified: Secondary | ICD-10-CM

## 2021-12-28 DIAGNOSIS — Z01818 Encounter for other preprocedural examination: Secondary | ICD-10-CM

## 2021-12-28 DIAGNOSIS — N289 Disorder of kidney and ureter, unspecified: Secondary | ICD-10-CM | POA: Diagnosis not present

## 2021-12-28 DIAGNOSIS — Z6841 Body Mass Index (BMI) 40.0 and over, adult: Secondary | ICD-10-CM | POA: Diagnosis not present

## 2021-12-28 DIAGNOSIS — K838 Other specified diseases of biliary tract: Secondary | ICD-10-CM | POA: Diagnosis not present

## 2021-12-28 HISTORY — DX: Cholecystitis, unspecified: K81.9

## 2021-12-28 HISTORY — DX: Personal history of urinary calculi: Z87.442

## 2021-12-28 HISTORY — DX: Unspecified osteoarthritis, unspecified site: M19.90

## 2021-12-28 HISTORY — DX: Failed or difficult intubation, initial encounter: T88.4XXA

## 2021-12-28 HISTORY — PX: CHOLECYSTECTOMY: SHX55

## 2021-12-28 LAB — CBC WITH DIFFERENTIAL/PLATELET
Abs Immature Granulocytes: 0.02 10*3/uL (ref 0.00–0.07)
Basophils Absolute: 0.1 10*3/uL (ref 0.0–0.1)
Basophils Relative: 2 %
Eosinophils Absolute: 0.1 10*3/uL (ref 0.0–0.5)
Eosinophils Relative: 2 %
HCT: 42.3 % (ref 39.0–52.0)
Hemoglobin: 14.7 g/dL (ref 13.0–17.0)
Immature Granulocytes: 0 %
Lymphocytes Relative: 40 %
Lymphs Abs: 2.2 10*3/uL (ref 0.7–4.0)
MCH: 30.9 pg (ref 26.0–34.0)
MCHC: 34.8 g/dL (ref 30.0–36.0)
MCV: 88.9 fL (ref 80.0–100.0)
Monocytes Absolute: 0.4 10*3/uL (ref 0.1–1.0)
Monocytes Relative: 6 %
Neutro Abs: 2.7 10*3/uL (ref 1.7–7.7)
Neutrophils Relative %: 50 %
Platelets: 246 10*3/uL (ref 150–400)
RBC: 4.76 MIL/uL (ref 4.22–5.81)
RDW: 13.5 % (ref 11.5–15.5)
WBC: 5.5 10*3/uL (ref 4.0–10.5)
nRBC: 0 % (ref 0.0–0.2)

## 2021-12-28 LAB — COMPREHENSIVE METABOLIC PANEL
ALT: 16 U/L (ref 0–44)
AST: 18 U/L (ref 15–41)
Albumin: 4.6 g/dL (ref 3.5–5.0)
Alkaline Phosphatase: 67 U/L (ref 38–126)
Anion gap: 7 (ref 5–15)
BUN: 19 mg/dL (ref 6–20)
CO2: 24 mmol/L (ref 22–32)
Calcium: 9.6 mg/dL (ref 8.9–10.3)
Chloride: 104 mmol/L (ref 98–111)
Creatinine, Ser: 1.23 mg/dL (ref 0.61–1.24)
GFR, Estimated: >60 mL/min (ref >60)
Glucose, Bld: 106 mg/dL — ABNORMAL HIGH (ref 70–99)
Potassium: 4.1 mmol/L (ref 3.5–5.1)
Sodium: 135 mmol/L (ref 135–145)
Total Bilirubin: 0.3 mg/dL (ref 0.3–1.2)
Total Protein: 7.8 g/dL (ref 6.5–8.1)

## 2021-12-28 SURGERY — LAPAROSCOPIC CHOLECYSTECTOMY WITH INTRAOPERATIVE CHOLANGIOGRAM
Anesthesia: General

## 2021-12-28 MED ORDER — ONDANSETRON HCL 4 MG/2ML IJ SOLN
INTRAMUSCULAR | Status: AC
  Filled 2021-12-28: qty 2

## 2021-12-28 MED ORDER — CHLORHEXIDINE GLUCONATE 0.12 % MT SOLN
15.0000 mL | Freq: Once | OROMUCOSAL | Status: AC
  Administered 2021-12-28: 15 mL via OROMUCOSAL

## 2021-12-28 MED ORDER — SCOPOLAMINE 1 MG/3DAYS TD PT72
1.0000 | MEDICATED_PATCH | TRANSDERMAL | Status: DC
  Administered 2021-12-28: 1.5 mg via TRANSDERMAL
  Filled 2021-12-28: qty 1

## 2021-12-28 MED ORDER — PROPOFOL 10 MG/ML IV BOLUS
INTRAVENOUS | Status: DC | PRN
  Administered 2021-12-28: 200 mg via INTRAVENOUS
  Administered 2021-12-28: 100 mg via INTRAVENOUS

## 2021-12-28 MED ORDER — CHLORHEXIDINE GLUCONATE CLOTH 2 % EX PADS: 6.0000 | MEDICATED_PAD | Freq: Once | CUTANEOUS | Status: DC

## 2021-12-28 MED ORDER — PROPOFOL 10 MG/ML IV BOLUS
INTRAVENOUS | Status: AC
  Filled 2021-12-28: qty 20

## 2021-12-28 MED ORDER — EPHEDRINE 5 MG/ML INJ
INTRAVENOUS | Status: AC
  Filled 2021-12-28: qty 5

## 2021-12-28 MED ORDER — DEXAMETHASONE SODIUM PHOSPHATE 10 MG/ML IJ SOLN
INTRAMUSCULAR | Status: AC
  Filled 2021-12-28: qty 1

## 2021-12-28 MED ORDER — ACETAMINOPHEN 500 MG PO TABS
1000.0000 mg | ORAL_TABLET | ORAL | Status: AC
  Administered 2021-12-28: 1000 mg via ORAL
  Filled 2021-12-28: qty 2

## 2021-12-28 MED ORDER — AMISULPRIDE (ANTIEMETIC) 5 MG/2ML IV SOLN: 10.0000 mg | Freq: Once | INTRAVENOUS | Status: DC | PRN

## 2021-12-28 MED ORDER — MIDAZOLAM HCL 2 MG/2ML IJ SOLN
INTRAMUSCULAR | Status: AC
  Filled 2021-12-28: qty 2

## 2021-12-28 MED ORDER — ONDANSETRON HCL 4 MG/2ML IJ SOLN
INTRAMUSCULAR | Status: DC | PRN
  Administered 2021-12-28: 4 mg via INTRAVENOUS

## 2021-12-28 MED ORDER — PHENYLEPHRINE HCL (PRESSORS) 10 MG/ML IV SOLN
INTRAVENOUS | Status: AC
  Filled 2021-12-28: qty 1

## 2021-12-28 MED ORDER — MIDAZOLAM HCL 2 MG/2ML IJ SOLN
INTRAMUSCULAR | Status: DC | PRN
  Administered 2021-12-28: 2 mg via INTRAVENOUS

## 2021-12-28 MED ORDER — RINGERS IRRIGATION IR SOLN
Status: DC | PRN
  Administered 2021-12-28: 1

## 2021-12-28 MED ORDER — CEFAZOLIN IN SODIUM CHLORIDE 3-0.9 GM/100ML-% IV SOLN
3.0000 g | INTRAVENOUS | Status: AC
  Administered 2021-12-28: 3 g via INTRAVENOUS
  Filled 2021-12-28: qty 100

## 2021-12-28 MED ORDER — ROCURONIUM BROMIDE 10 MG/ML (PF) SYRINGE
PREFILLED_SYRINGE | INTRAVENOUS | Status: AC
  Filled 2021-12-28: qty 10

## 2021-12-28 MED ORDER — PHENYLEPHRINE HCL (PRESSORS) 10 MG/ML IV SOLN
INTRAVENOUS | Status: DC | PRN
  Administered 2021-12-28 (×2): 80 ug via INTRAVENOUS

## 2021-12-28 MED ORDER — LACTATED RINGERS IV SOLN: INTRAVENOUS | Status: DC

## 2021-12-28 MED ORDER — BUPIVACAINE LIPOSOME 1.3 % IJ SUSP
INTRAMUSCULAR | Status: DC | PRN
  Administered 2021-12-28: 20 mL

## 2021-12-28 MED ORDER — SUGAMMADEX SODIUM 500 MG/5ML IV SOLN
INTRAVENOUS | Status: AC
  Filled 2021-12-28: qty 5

## 2021-12-28 MED ORDER — BUPIVACAINE LIPOSOME 1.3 % IJ SUSP
INTRAMUSCULAR | Status: AC
  Filled 2021-12-28: qty 20

## 2021-12-28 MED ORDER — PROMETHAZINE HCL 25 MG/ML IJ SOLN: 6.2500 mg | INTRAMUSCULAR | Status: DC | PRN

## 2021-12-28 MED ORDER — HYDROCODONE-ACETAMINOPHEN 5-325 MG PO TABS: 1.0000 | ORAL_TABLET | Freq: Four times a day (QID) | ORAL | 0 refills | Status: DC | PRN

## 2021-12-28 MED ORDER — OXYCODONE HCL 5 MG/5ML PO SOLN: 5.0000 mg | Freq: Once | ORAL | Status: AC | PRN

## 2021-12-28 MED ORDER — SUGAMMADEX SODIUM 500 MG/5ML IV SOLN
INTRAVENOUS | Status: DC | PRN
Start: 2021-12-28 — End: 2021-12-28
  Administered 2021-12-28: 500 mg via INTRAVENOUS

## 2021-12-28 MED ORDER — EPHEDRINE SULFATE-NACL 50-0.9 MG/10ML-% IV SOSY
PREFILLED_SYRINGE | INTRAVENOUS | Status: DC | PRN
Start: 2021-12-28 — End: 2021-12-28
  Administered 2021-12-28 (×2): 10 mg via INTRAVENOUS
  Administered 2021-12-28: 5 mg via INTRAVENOUS

## 2021-12-28 MED ORDER — HEPARIN SODIUM (PORCINE) 5000 UNIT/ML IJ SOLN
5000.0000 [IU] | Freq: Once | INTRAMUSCULAR | Status: AC
  Administered 2021-12-28: 5000 [IU] via SUBCUTANEOUS
  Filled 2021-12-28: qty 1

## 2021-12-28 MED ORDER — HYDROMORPHONE HCL 1 MG/ML IJ SOLN
0.2500 mg | INTRAMUSCULAR | Status: DC | PRN
  Administered 2021-12-28 (×2): 0.5 mg via INTRAVENOUS

## 2021-12-28 MED ORDER — LIDOCAINE 2% (20 MG/ML) 5 ML SYRINGE
INTRAMUSCULAR | Status: DC | PRN
  Administered 2021-12-28: 100 mg via INTRAVENOUS

## 2021-12-28 MED ORDER — PHENYLEPHRINE HCL-NACL 20-0.9 MG/250ML-% IV SOLN
INTRAVENOUS | Status: DC | PRN
  Administered 2021-12-28: 50 ug/min via INTRAVENOUS

## 2021-12-28 MED ORDER — FENTANYL CITRATE (PF) 250 MCG/5ML IJ SOLN
INTRAMUSCULAR | Status: DC | PRN
  Administered 2021-12-28 (×3): 50 ug via INTRAVENOUS
  Administered 2021-12-28: 100 ug via INTRAVENOUS

## 2021-12-28 MED ORDER — OXYCODONE HCL 5 MG PO TABS
5.0000 mg | ORAL_TABLET | Freq: Once | ORAL | Status: AC | PRN
  Administered 2021-12-28: 5 mg via ORAL

## 2021-12-28 MED ORDER — FENTANYL CITRATE (PF) 250 MCG/5ML IJ SOLN
INTRAMUSCULAR | Status: AC
  Filled 2021-12-28: qty 5

## 2021-12-28 MED ORDER — 0.9 % SODIUM CHLORIDE (POUR BTL) OPTIME
TOPICAL | Status: DC | PRN
  Administered 2021-12-28: 1000 mL

## 2021-12-28 MED ORDER — ROCURONIUM BROMIDE 10 MG/ML (PF) SYRINGE
PREFILLED_SYRINGE | INTRAVENOUS | Status: DC | PRN
  Administered 2021-12-28: 20 mg via INTRAVENOUS
  Administered 2021-12-28: 100 mg via INTRAVENOUS
  Administered 2021-12-28 (×2): 20 mg via INTRAVENOUS

## 2021-12-28 MED ORDER — DEXAMETHASONE SODIUM PHOSPHATE 10 MG/ML IJ SOLN
INTRAMUSCULAR | Status: DC | PRN
  Administered 2021-12-28: 10 mg via INTRAVENOUS

## 2021-12-28 MED ORDER — HYDROMORPHONE HCL 1 MG/ML IJ SOLN
INTRAMUSCULAR | Status: AC
  Filled 2021-12-28: qty 1

## 2021-12-28 MED ORDER — LIDOCAINE HCL (PF) 2 % IJ SOLN
INTRAMUSCULAR | Status: AC
  Filled 2021-12-28: qty 5

## 2021-12-28 MED ORDER — OXYCODONE HCL 5 MG PO TABS
ORAL_TABLET | ORAL | Status: AC
  Filled 2021-12-28: qty 1

## 2021-12-28 MED ORDER — BUPIVACAINE LIPOSOME 1.3 % IJ SUSP: 20.0000 mL | Freq: Once | INTRAMUSCULAR | Status: DC

## 2021-12-28 MED ORDER — IOHEXOL 300 MG/ML  SOLN
INTRAMUSCULAR | Status: DC | PRN
  Administered 2021-12-28: 10 mL

## 2021-12-28 MED ORDER — ORAL CARE MOUTH RINSE: 15.0000 mL | Freq: Once | OROMUCOSAL | Status: AC

## 2021-12-28 SURGICAL SUPPLY — 53 items
ADH SKN CLS APL DERMABOND .7 (GAUZE/BANDAGES/DRESSINGS) ×1
APL SKNCLS STERI-STRIP NONHPOA (GAUZE/BANDAGES/DRESSINGS)
APL SWBSTK 6 STRL LF DISP (MISCELLANEOUS) ×2
APPLICATOR COTTON TIP 6 STRL (MISCELLANEOUS) ×4 IMPLANT
APPLICATOR COTTON TIP 6IN STRL (MISCELLANEOUS) ×4
APPLIER CLIP 5 13 M/L LIGAMAX5 (MISCELLANEOUS)
APPLIER CLIP ROT 10 11.4 M/L (STAPLE) ×2
APR CLP MED LRG 11.4X10 (STAPLE) ×1
APR CLP MED LRG 5 ANG JAW (MISCELLANEOUS)
BAG COUNTER SPONGE SURGICOUNT (BAG) IMPLANT
BAG SPEC RTRVL 10 TROC 200 (ENDOMECHANICALS) ×1
BAG SPNG CNTER NS LX DISP (BAG)
BENZOIN TINCTURE PRP APPL 2/3 (GAUZE/BANDAGES/DRESSINGS) IMPLANT
CABLE HIGH FREQUENCY MONO STRZ (ELECTRODE) IMPLANT
CATH REDDICK CHOLANGI 4FR 50CM (CATHETERS) ×3 IMPLANT
CLIP APPLIE 5 13 M/L LIGAMAX5 (MISCELLANEOUS) IMPLANT
CLIP APPLIE ROT 10 11.4 M/L (STAPLE) ×2 IMPLANT
COVER MAYO STAND XLG (MISCELLANEOUS) ×3 IMPLANT
COVER SURGICAL LIGHT HANDLE (MISCELLANEOUS) ×3 IMPLANT
DERMABOND ADVANCED (GAUZE/BANDAGES/DRESSINGS) ×1
DERMABOND ADVANCED .7 DNX12 (GAUZE/BANDAGES/DRESSINGS) ×2 IMPLANT
DRAPE C-ARM 42X120 X-RAY (DRAPES) ×3 IMPLANT
ELECT L-HOOK LAP 45CM DISP (ELECTROSURGICAL) ×2
ELECT PENCIL ROCKER SW 15FT (MISCELLANEOUS) ×1 IMPLANT
ELECT REM PT RETURN 15FT ADLT (MISCELLANEOUS) ×3 IMPLANT
ELECTRODE L-HOOK LAP 45CM DISP (ELECTROSURGICAL) ×2 IMPLANT
GLOVE SURG ENC TEXT LTX SZ8 (GLOVE) ×3 IMPLANT
GOWN STRL REUS W/ TWL XL LVL3 (GOWN DISPOSABLE) ×2 IMPLANT
GOWN STRL REUS W/TWL XL LVL3 (GOWN DISPOSABLE) ×2
GRASPER SUT TROCAR 14GX15 (MISCELLANEOUS) ×1 IMPLANT
HEMOSTAT SURGICEL 4X8 (HEMOSTASIS) IMPLANT
IRRIG SUCT STRYKERFLOW 2 WTIP (MISCELLANEOUS) ×2
IRRIGATION SUCT STRKRFLW 2 WTP (MISCELLANEOUS) ×2 IMPLANT
IV CATH 14GX2 1/4 (CATHETERS) ×3 IMPLANT
KIT BASIN OR (CUSTOM PROCEDURE TRAY) ×3 IMPLANT
KIT TURNOVER KIT A (KITS) IMPLANT
L-HOOK LAP DISP 36CM (ELECTROSURGICAL) ×2
LHOOK LAP DISP 36CM (ELECTROSURGICAL) IMPLANT
MAT PREVALON FULL STRYKER (MISCELLANEOUS) ×1 IMPLANT
POUCH RETRIEVAL ECOSAC 10 (ENDOMECHANICALS) IMPLANT
POUCH RETRIEVAL ECOSAC 10MM (ENDOMECHANICALS) ×2
SCISSORS LAP 5X45 EPIX DISP (ENDOMECHANICALS) ×3 IMPLANT
SET TUBE SMOKE EVAC HIGH FLOW (TUBING) ×3 IMPLANT
SLEEVE XCEL OPT CAN 5 100 (ENDOMECHANICALS) ×7 IMPLANT
SPIKE FLUID TRANSFER (MISCELLANEOUS) ×3 IMPLANT
STRIP CLOSURE SKIN 1/2X4 (GAUZE/BANDAGES/DRESSINGS) IMPLANT
SUT MNCRL AB 4-0 PS2 18 (SUTURE) ×6 IMPLANT
SYR 20ML LL LF (SYRINGE) ×3 IMPLANT
TOWEL OR 17X26 10 PK STRL BLUE (TOWEL DISPOSABLE) ×3 IMPLANT
TRAY LAPAROSCOPIC (CUSTOM PROCEDURE TRAY) ×3 IMPLANT
TROCAR BLADELESS OPT 5 100 (ENDOMECHANICALS) ×3 IMPLANT
TROCAR XCEL BLUNT TIP 100MML (ENDOMECHANICALS) IMPLANT
TROCAR XCEL NON-BLD 11X100MML (ENDOMECHANICALS) ×3 IMPLANT

## 2021-12-28 NOTE — Anesthesia Preprocedure Evaluation (Signed)
Anesthesia Evaluation  ?Patient identified by MRN, date of birth, ID band ?Patient awake ? ? ? ?Reviewed: ?Allergy & Precautions, H&P , NPO status , Patient's Chart, lab work & pertinent test results, reviewed documented beta blocker date and time  ? ?Airway ?Mallampati: II ? ?TM Distance: >3 FB ?Neck ROM: Full ? ? ? Dental ?no notable dental hx. ? ?  ?Pulmonary ?neg pulmonary ROS,  ?  ?Pulmonary exam normal ?breath sounds clear to auscultation ? ? ? ? ? ? Cardiovascular ?hypertension, Pt. on medications ?Normal cardiovascular exam ?Rhythm:Regular Rate:Normal ? ?Likely untreated Htn ?  ?Neuro/Psych ?negative neurological ROS ? negative psych ROS  ? GI/Hepatic ?negative GI ROS, Neg liver ROS,   ?Endo/Other  ?Morbid obesity ? Renal/GU ?Renal InsufficiencyRenal disease  ? ?  ?Musculoskeletal ? ?(+) Arthritis , Osteoarthritis,   ? Abdominal ?(+) + obese,   ?Peds ? Hematology ?negative hematology ROS ?(+)   ?Anesthesia Other Findings ? ? Reproductive/Obstetrics ?negative OB ROS ? ?  ? ? ? ? ? ? ? ? ? ? ? ? ? ?  ?  ? ? ? ? ? ? ? ? ?Anesthesia Physical ? ?Anesthesia Plan ? ?ASA: 3 ? ?Anesthesia Plan: General  ? ?Post-op Pain Management:   ? ?Induction: Intravenous ? ?PONV Risk Score and Plan: 2 and Ondansetron, Midazolam and Treatment may vary due to age or medical condition ? ?Airway Management Planned: Oral ETT ? ?Additional Equipment:  ? ?Intra-op Plan:  ? ?Post-operative Plan: Extubation in OR ? ?Informed Consent: I have reviewed the patients History and Physical, chart, labs and discussed the procedure including the risks, benefits and alternatives for the proposed anesthesia with the patient or authorized representative who has indicated his/her understanding and acceptance.  ? ? ? ?Dental advisory given ? ?Plan Discussed with: CRNA ? ?Anesthesia Plan Comments:   ? ? ? ? ? ? ?Anesthesia Quick Evaluation ? ?

## 2021-12-28 NOTE — Transfer of Care (Signed)
Immediate Anesthesia Transfer of Care Note ? ?Patient: Bobby Ortiz ? ?Procedure(s) Performed: Procedure(s): ?LAPAROSCOPIC CHOLECYSTECTOMY WITH INTRAOPERATIVE CHOLANGIOGRAM (N/A) ? ?Patient Location: PACU ? ?Anesthesia Type:General ? ?Level of Consciousness: Patient easily awoken, sedated, comfortable, cooperative, following commands, responds to stimulation.  ? ?Airway & Oxygen Therapy: Patient spontaneously breathing, ventilating well, oxygen via simple oxygen mask. ? ?Post-op Assessment: Report given to PACU RN, vital signs reviewed and stable, moving all extremities.  ? ?Post vital signs: Reviewed and stable. ? ?Complications: No apparent anesthesia complications ?Last Vitals:  ?Vitals Value Taken Time  ?BP 127/79 12/28/21 1609  ?Temp    ?Pulse 75 12/28/21 1611  ?Resp 21 12/28/21 1611  ?SpO2 97 % 12/28/21 1611  ?Vitals shown include unvalidated device data. ? ?Last Pain:  ?Vitals:  ? 12/28/21 1108  ?PainSc: 0-No pain  ?   ? ?  ? ?Complications: No notable events documented. ?

## 2021-12-28 NOTE — Op Note (Signed)
Bobby Ortiz ? ?Primary Care Physician:  Junie Spencer, FNP ? ? ? ?12/28/2021  3:45 PM ? ?Procedure: Laparoscopic Cholecystectomy with intraoperative cholangiogram ? ?Surgeon: Pollyann Savoy. Daphine Deutscher, MD, FACS ?Asst:  Benna Dunks, MD ? ?Anes:  General ? ?Drains:  None ? ?Findings: Severe subacute and fibrotic cholecystectomy;  intraop cholangiogram showed a large CBD with tapering and flow into the duodenum;  intrahepatic filling ? ?Description of Procedure: The patient was taken to OR 3 and given general anesthesia.  The patient was prepped with chlorohexidine prep and draped sterilely.  A percutaneous drain was in the right upper quadrant.   A time out was performed including identifying the patient and discussing their procedure.  Access to the abdomen was achieved with a 5 mm Optiview through the left upper quadrant.  Port placement included four 5 mm trocars and one ten/eleven in the upper midline.  Adhesions noted to the umbilical mesh.   ? ?The gallbladder was visualized and appeared stuck to the anterior abdominal wall with a fibrotic drainage tract.  This was transected.  There was some bile leakage and this was grasped at the top to control that.  There was a lot of fat and omentum walling off the fundus of the gallbladder and this was stripped away with blunt dissection and with the hook cautery.  Careful and tedious dissection was used to eventually expose the infundibulum and Kalos triangle with an artery and the cystic duct.  The cystic duct was clipped at the junction of the gallbladder and incision made and a cholangiogram obtained which showed intrahepatic filling and then free flow in the duodenum with tapering of the distal duct that appeared normal..   The fundus of the gallbaldder was grasped and the gallbladder was elevated. Traction on the infundibulum allowed for successful demonstration of the critical view. Inflammatory changes were severe subacute and fibrotic.  The cystic duct was identified  and clipped up on the gallbladder and an incision was made in the cystic duct and the Reddick catheter was inserted after milking the cystic duct of any debris.  ? ?The cystic duct was then triple clipped and divided, the cystic artery was double clipped and divided and then the gallbladder was removed from the gallbladder bed. Removal of the gallbladder from the gallbladder bed was challenging because of the thickness and chronic inflammatory nature.  There was some bile that came out but no gross stones were noted to have fallen out.  A careful dissection disconnected the gallbladder from the gallbladder bed and this was placed in a pico bag and brought out through the 1011 port..  The gallbladder was then placed in a bag and brought out through one of the trocar sites. The gallbladder bed was inspected and no bleeding or bile leaks were seen.   Laparoscopic visualization was used when closing fascial defects for trocar site using a 0 Vicryl and the PMI device..   Incisions were injected with a total of 10 cc of Exparel individually Dermabond.  And closed with 4-0 Monocryl and Dermabond on the skin.  Sponge and needle count were the side of the drainage catheter was incised to allow it to drain and the remaining sutures were closed with 4-0 Vicryl.  When I did remove the catheter initially I reprepped that whole area with Betadine. ?.   ? ?The patient was taken to the recovery room in satisfactory condition.  ?

## 2021-12-28 NOTE — Interval H&P Note (Signed)
History and Physical Interval Note: ? ?12/28/2021 ?11:47 AM ? ?Bobby Ortiz  has presented today for surgery, with the diagnosis of CHOLECYSTITIS WITH A TUBE DRAIN IN PLACE.  The various methods of treatment have been discussed with the patient and family. After consideration of risks, benefits and other options for treatment, the patient has consented to  Procedure(s): ?LAPAROSCOPIC CHOLECYSTECTOMY WITH INTRAOPERATIVE CHOLANGIOGRAM (N/A) as a surgical intervention.  The patient's history has been reviewed, patient examined, no change in status, stable for surgery.  I have reviewed the patient's chart and labs.  Questions were answered to the patient's satisfaction.   ? ? ?[name] ? ? ?

## 2021-12-28 NOTE — Anesthesia Procedure Notes (Addendum)
Procedure Name: Intubation ?Date/Time: 12/28/2021 12:51 PM ?Performed by: Sharlette Dense, CRNA ?Pre-anesthesia Checklist: Patient identified, Emergency Drugs available, Suction available and Patient being monitored ?Patient Re-evaluated:Patient Re-evaluated prior to induction ?Oxygen Delivery Method: Circle system utilized ?Preoxygenation: Pre-oxygenation with 100% oxygen ?Induction Type: IV induction ?Ventilation: Oral airway inserted - appropriate to patient size, Two handed mask ventilation required and Mask ventilation with difficulty ?Laryngoscope Size: Mac and 4 ?Grade View: Grade [name] ?Tube type: Oral ?Tube size: 8.0 mm ?Number of attempts: 3 ?Airway Equipment and Method: Stylet ?Placement Confirmation: ETT inserted through vocal cords under direct vision, positive ETCO2 and breath sounds checked- equal and bilateral ?Secured at: 22 cm ?Tube secured with: Tape ?Dental Injury: Teeth and Oropharynx as per pre-operative assessment  ?Difficulty Due To: Difficulty was anticipated, Difficult Airway- due to anterior larynx, Difficult Airway- due to limited oral opening and Difficult Airway- due to reduced neck mobility ?Future Recommendations: Recommend- induction with short-acting agent, and alternative techniques readily available ?Comments: First attempt at intubation made by Cornell Barman, EMT student.  Unable to place ETT.  2nd attempt made by CRNA with Sabra Heck 3.  Only visualized rim of glottic opening.  Unable to advance tube.  Difficult to mask before and during intubation process necessitating a #4 LMA to ventilate between 2nd and 3rd attempt.  3rd attempt successful by Dr. Sabra Heck with MAC 4 blade. ? ? ? ? ?

## 2021-12-28 NOTE — Anesthesia Postprocedure Evaluation (Signed)
Anesthesia Post Note ? ?Patient: Bobby Ortiz ? ?Procedure(s) Performed: LAPAROSCOPIC CHOLECYSTECTOMY WITH INTRAOPERATIVE CHOLANGIOGRAM ? ?  ? ?Patient location during evaluation: PACU ?Anesthesia Type: General ?Level of consciousness: awake and alert ?Pain management: pain level controlled ?Vital Signs Assessment: post-procedure vital signs reviewed and stable ?Respiratory status: spontaneous breathing, nonlabored ventilation and respiratory function stable ?Cardiovascular status: blood pressure returned to baseline and stable ?Postop Assessment: no apparent nausea or vomiting ?Anesthetic complications: no ? ? ?No notable events documented. ? ?Last Vitals:  ?Vitals:  ? 12/28/21 1710 12/28/21 1715  ?BP: (!) 158/98 (!) 112/94  ?Pulse: 63 63  ?Resp:  16  ?Temp:    ?SpO2: 95% 95%  ?  ?Last Pain:  ?Vitals:  ? 12/28/21 1715  ?PainSc: 8   ? ? ?  ?  ?  ?  ?  ?  ? ?Lowella Curb ? ? ? ? ?

## 2021-12-29 ENCOUNTER — Encounter (HOSPITAL_COMMUNITY): Payer: Self-pay | Admitting: Surgery

## 2021-12-30 LAB — SURGICAL PATHOLOGY

## 2022-07-08 ENCOUNTER — Other Ambulatory Visit: Payer: Self-pay | Admitting: Family

## 2022-07-08 DIAGNOSIS — I159 Secondary hypertension, unspecified: Secondary | ICD-10-CM

## 2022-07-18 ENCOUNTER — Other Ambulatory Visit: Payer: Self-pay | Admitting: Family

## 2022-07-18 DIAGNOSIS — M109 Gout, unspecified: Secondary | ICD-10-CM

## 2022-08-17 ENCOUNTER — Encounter: Payer: Self-pay | Admitting: Family

## 2022-08-17 ENCOUNTER — Other Ambulatory Visit: Payer: Self-pay | Admitting: Family

## 2022-08-17 ENCOUNTER — Telehealth: Payer: Self-pay | Admitting: Family

## 2022-08-17 DIAGNOSIS — M109 Gout, unspecified: Secondary | ICD-10-CM

## 2022-08-17 NOTE — Telephone Encounter (Signed)
Left message to [facility]duel appt and mailed letter

## 2022-08-17 NOTE — Telephone Encounter (Signed)
Lmtcb.

## 2022-08-17 NOTE — Telephone Encounter (Signed)
Needs an appointment to be seen  

## 2022-08-24 NOTE — Telephone Encounter (Signed)
Called patient appt made

## 2022-09-12 ENCOUNTER — Encounter: Payer: Self-pay | Admitting: Family

## 2022-09-12 ENCOUNTER — Ambulatory Visit: Payer: Commercial Managed Care - PPO | Admitting: Family

## 2022-09-12 VITALS — BP 151/85 | HR 84 | Temp 97.6°F | Ht 75.0 in

## 2022-09-12 DIAGNOSIS — M109 Gout, unspecified: Secondary | ICD-10-CM | POA: Diagnosis not present

## 2022-09-12 DIAGNOSIS — I10 Essential (primary) hypertension: Secondary | ICD-10-CM | POA: Diagnosis not present

## 2022-09-12 DIAGNOSIS — E785 Hyperlipidemia, unspecified: Secondary | ICD-10-CM | POA: Diagnosis not present

## 2022-09-12 DIAGNOSIS — Z0001 Encounter for general adult medical examination with abnormal findings: Secondary | ICD-10-CM | POA: Diagnosis not present

## 2022-09-12 DIAGNOSIS — Z6841 Body Mass Index (BMI) 40.0 and over, adult: Secondary | ICD-10-CM

## 2022-09-12 DIAGNOSIS — K429 Umbilical hernia without obstruction or gangrene: Secondary | ICD-10-CM | POA: Insufficient documentation

## 2022-09-12 DIAGNOSIS — Z0000 Encounter for general adult medical examination without abnormal findings: Secondary | ICD-10-CM

## 2022-09-12 MED ORDER — COLCHICINE 0.6 MG PO TABS: ORAL_TABLET | ORAL | 2 refills | Status: DC

## 2022-09-12 MED ORDER — LISINOPRIL 40 MG PO TABS: 40.0000 mg | ORAL_TABLET | Freq: Every day | ORAL | 3 refills | Status: DC

## 2022-09-12 MED ORDER — AMLODIPINE BESYLATE 10 MG PO TABS: 10.0000 mg | ORAL_TABLET | Freq: Every day | ORAL | 1 refills | Status: DC

## 2022-09-12 MED ORDER — ALLOPURINOL 100 MG PO TABS: ORAL_TABLET | ORAL | 3 refills | Status: DC

## 2022-09-12 NOTE — Patient Instructions (Signed)

## 2022-09-12 NOTE — Progress Notes (Signed)
Subjective:    Patient ID: Bobby Ortiz, male    DOB: 02/17/1961, 61 y.o.   MRN: [id number]  Chief Complaint  Patient presents with   Medical Management of Chronic Issues   Pt presents to the office today for CPE. He has gout and takes allopurinol 100 mg and uses colchicine as needed. He can not remember his last flare up.   Hyperlipidemia This is a chronic problem. The current episode started more than 1 year ago. Exacerbating diseases include obesity. Pertinent negatives include no shortness of breath. Current antihyperlipidemic treatment includes diet change. The current treatment provides mild improvement of lipids. Risk factors for coronary artery disease include dyslipidemia and hypertension.  Hypertension This is a chronic problem. The current episode started more than 1 year ago. The problem is controlled. Pertinent negatives include no malaise/fatigue, peripheral edema or shortness of breath. Risk factors for coronary artery disease include obesity, dyslipidemia and male gender. The current treatment provides moderate improvement.      Review of Systems  Constitutional:  Negative for malaise/fatigue.  Respiratory:  Negative for shortness of breath.        Objective:   Physical Exam Vitals reviewed.  Constitutional:      General: He is not in acute distress.    Appearance: He is well-developed.  HENT:     Head: Normocephalic.     Right Ear: Tympanic membrane normal.     Left Ear: Tympanic membrane normal.  Eyes:     General:        Right eye: No discharge.        Left eye: No discharge.     Pupils: Pupils are equal, round, and reactive to light.  Neck:     Thyroid: No thyromegaly.  Cardiovascular:     Rate and Rhythm: Normal rate and regular rhythm.     Heart sounds: Normal heart sounds. No murmur heard. Pulmonary:     Effort: Pulmonary effort is normal. No respiratory distress.     Breath sounds: Normal breath sounds. No wheezing.  Abdominal:     General:  Bowel sounds are normal. There is no distension.     Palpations: Abdomen is soft.     Tenderness: There is no abdominal tenderness.     Hernia: A hernia is present.  Musculoskeletal:        General: No tenderness. Normal range of motion.     Cervical back: Normal range of motion and neck supple.  Skin:    General: Skin is warm and dry.     Findings: No erythema or rash.  Neurological:     Mental Status: He is alert and oriented to person, place, and time.     Cranial Nerves: No cranial nerve deficit.     Deep Tendon Reflexes: Reflexes are normal and symmetric.  Psychiatric:        Behavior: Behavior normal.        Thought Content: Thought content normal.        Judgment: Judgment normal.       BP (!) 145/90   Pulse 84   Temp 97.6 F (36.4 C)   Ht _0  (1.905 m)   SpO2 98%   BMI 40.50 kg/m      Assessment & Plan:   Bobby Ortiz comes in today with chief complaint of Medical Management of Chronic Issues (Pt would like refill of Gout medicaiton)   Diagnosis and orders addressed:  1. Acute gout of multiple sites, unspecified cause -  colchicine 0.6 MG tablet; Take 0.66m by mouth twice daily as needed for gout attack. (may take every two hours up to 6 doses at acute onset)  Dispense: 20 tablet; Refill: 2 - allopurinol (ZYLOPRIM) 100 MG tablet; TAKE ONE (1) TABLET EACH DAY  Dispense: 90 tablet; Refill: 3 - CMP14+EGFR - CBC with Differential/Platelet  2. Primary hypertension Will increase amlodipine to 10 mg from 5 mg  -Dash diet information given -Exercise encouraged - Stress Management  -Continue current meds -RTO in 2-4 weeks - lisinopril (ZESTRIL) 40 MG tablet; Take 1 tablet (40 mg total) by mouth daily.  Dispense: 90 tablet; Refill: 3 - CMP14+EGFR - CBC with Differential/Platelet - amLODipine (NORVASC) 10 MG tablet; Take 1 tablet (10 mg total) by mouth daily.  Dispense: 90 tablet; Refill: 1  3. Annual physical exam - CMP14+EGFR - CBC with  Differential/Platelet - Lipid panel - PSA, total and free - TSH  4. Morbid obesity ([place] - CMP14+EGFR - CBC with Differential/Platelet  5. Hyperlipidemia, unspecified hyperlipidemia type - CMP14+EGFR - CBC with Differential/Platelet  6. Umbilical hernia without obstruction and without gangrene Wants to hold off on referral at this times  - CMP14+EGFR - CBC with Differential/Platelet   Labs pending Health Maintenance reviewed Diet and exercise encouraged  Follow up plan: 2-4 weeks to recheck HTN  CEvelina Dun FNP

## 2022-09-13 ENCOUNTER — Other Ambulatory Visit: Payer: Self-pay | Admitting: Family

## 2022-09-13 LAB — CBC WITH DIFFERENTIAL/PLATELET
Basophils Absolute: 0.1 10*3/uL (ref 0.0–0.2)
Basos: 1 %
EOS (ABSOLUTE): 0.1 10*3/uL (ref 0.0–0.4)
Eos: 1 %
Hematocrit: 45.6 % (ref 37.5–51.0)
Hemoglobin: 15.5 g/dL (ref 13.0–17.7)
Immature Grans (Abs): 0 10*3/uL (ref 0.0–0.1)
Immature Granulocytes: 0 %
Lymphocytes Absolute: 3 10*3/uL (ref 0.7–3.1)
Lymphs: 45 %
MCH: 31.2 pg (ref 26.6–33.0)
MCHC: 34 g/dL (ref 31.5–35.7)
MCV: 92 fL (ref 79–97)
Monocytes Absolute: 0.4 10*3/uL (ref 0.1–0.9)
Monocytes: 6 %
Neutrophils Absolute: 3.1 10*3/uL (ref 1.4–7.0)
Neutrophils: 47 %
Platelets: 253 10*3/uL (ref 150–450)
RBC: 4.97 x10E6/uL (ref 4.14–5.80)
RDW: 12.9 % (ref 11.6–15.4)
WBC: 6.7 10*3/uL (ref 3.4–10.8)

## 2022-09-13 LAB — CMP14+EGFR
ALT: 20 IU/L (ref 0–44)
AST: 15 IU/L (ref 0–40)
Albumin/Globulin Ratio: 1.7 (ref 1.2–2.2)
Albumin: 4.5 g/dL (ref 3.9–4.9)
Alkaline Phosphatase: 88 IU/L (ref 44–121)
BUN/Creatinine Ratio: 16 (ref 10–24)
BUN: 20 mg/dL (ref 8–27)
Bilirubin Total: 0.2 mg/dL (ref 0.0–1.2)
CO2: 21 mmol/L (ref 20–29)
Calcium: 9.6 mg/dL (ref 8.6–10.2)
Chloride: 102 mmol/L (ref 96–106)
Creatinine, Ser: 1.24 mg/dL (ref 0.76–1.27)
Globulin, Total: 2.7 g/dL (ref 1.5–4.5)
Glucose: 118 mg/dL — ABNORMAL HIGH (ref 70–99)
Potassium: 5.2 mmol/L (ref 3.5–5.2)
Sodium: 139 mmol/L (ref 134–144)
Total Protein: 7.2 g/dL (ref 6.0–8.5)
eGFR: 66 mL/min/{1.73_m2} (ref >59)

## 2022-09-13 LAB — LIPID PANEL
Chol/HDL Ratio: 4.5 ratio (ref 0.0–5.0)
Cholesterol, Total: 172 mg/dL (ref 100–199)
HDL: 38 mg/dL — ABNORMAL LOW (ref >39)
LDL Chol Calc (NIH): 96 mg/dL (ref 0–99)
Triglycerides: 221 mg/dL — ABNORMAL HIGH (ref 0–149)
VLDL Cholesterol Cal: 38 mg/dL (ref 5–40)

## 2022-09-13 LAB — PSA, TOTAL AND FREE
PSA, Free Pct: 25 %
PSA, Free: 0.45 ng/mL
Prostate Specific Ag, Serum: 1.8 ng/mL (ref 0.0–4.0)

## 2022-09-13 LAB — TSH: TSH: 3.85 u[IU]/mL (ref 0.450–4.500)

## 2022-09-13 MED ORDER — ROSUVASTATIN CALCIUM 5 MG PO TABS: 5.0000 mg | ORAL_TABLET | Freq: Every day | ORAL | 3 refills | Status: DC

## 2022-10-03 ENCOUNTER — Ambulatory Visit: Payer: Commercial Managed Care - PPO | Admitting: Family

## 2022-10-03 ENCOUNTER — Encounter: Payer: Self-pay | Admitting: Family

## 2022-10-03 VITALS — BP 136/83 | HR 64 | Temp 98.5°F | Ht 75.0 in | Wt 342.0 lb

## 2022-10-03 DIAGNOSIS — E785 Hyperlipidemia, unspecified: Secondary | ICD-10-CM

## 2022-10-03 DIAGNOSIS — I10 Essential (primary) hypertension: Secondary | ICD-10-CM

## 2022-10-03 DIAGNOSIS — Z1211 Encounter for screening for malignant neoplasm of colon: Secondary | ICD-10-CM

## 2022-10-03 NOTE — Progress Notes (Signed)
Subjective:    Patient ID: Bobby Ortiz, male    DOB: 06-Oct-1960, 62 y.o.   MRN: [id number]  Chief Complaint  Patient presents with   Medical Management of Chronic Issues   PT presents to the office today to recheck HTN. He was seen on 09/12/22 and we increase amlodipine to 10 mg from 5 mg. His BP today is at goal!  He has CKD and avoids NSAID's.  Hypertension This is a chronic problem. The current episode started more than 1 year ago. The problem has been resolved since onset. The problem is controlled. Pertinent negatives include no malaise/fatigue, peripheral edema or shortness of breath. Risk factors for coronary artery disease include dyslipidemia, obesity, male gender and sedentary lifestyle. Past treatments include calcium channel blockers and ACE inhibitors. The current treatment provides moderate improvement.  Hyperlipidemia This is a chronic problem. The current episode started more than 1 year ago. The problem is controlled. Exacerbating diseases include obesity. Pertinent negatives include no shortness of breath. Current antihyperlipidemic treatment includes statins. The current treatment provides moderate improvement of lipids. Risk factors for coronary artery disease include dyslipidemia, hypertension, male sex and a sedentary lifestyle.      Review of Systems  Constitutional:  Negative for malaise/fatigue.  Respiratory:  Negative for shortness of breath.   All other systems reviewed and are negative.      Objective:   Physical Exam Vitals reviewed.  Constitutional:      General: He is not in acute distress.    Appearance: He is well-developed. He is obese.  HENT:     Head: Normocephalic.     Right Ear: Tympanic membrane normal.     Left Ear: Tympanic membrane normal.  Eyes:     General:        Right eye: No discharge.        Left eye: No discharge.     Pupils: Pupils are equal, round, and reactive to light.  Neck:     Thyroid: No thyromegaly.   Cardiovascular:     Rate and Rhythm: Normal rate and regular rhythm.     Heart sounds: Normal heart sounds. No murmur heard. Pulmonary:     Effort: Pulmonary effort is normal. No respiratory distress.     Breath sounds: Normal breath sounds. No wheezing.  Abdominal:     General: Bowel sounds are normal. There is no distension.     Palpations: Abdomen is soft.     Tenderness: There is no abdominal tenderness.  Musculoskeletal:        General: No tenderness. Normal range of motion.     Cervical back: Normal range of motion and neck supple.  Skin:    General: Skin is warm and dry.     Findings: No erythema or rash.  Neurological:     Mental Status: He is alert and oriented to person, place, and time.     Cranial Nerves: No cranial nerve deficit.     Deep Tendon Reflexes: Reflexes are normal and symmetric.  Psychiatric:        Behavior: Behavior normal.        Thought Content: Thought content normal.        Judgment: Judgment normal.       BP 136/83   Pulse 64   Temp 98.5 F (36.9 C)   Ht 6\' 3"  (1.905 m)   Wt (!) 342 lb (155.1 kg)   SpO2 97%   BMI 42.75 kg/m      Assessment &  Plan:  Mcdonald Reiling comes in today with chief complaint of Medical Management of Chronic Issues   Diagnosis and orders addressed:  1. Primary hypertension  2. Morbid obesity ([place])  3. Hyperlipidemia, unspecified hyperlipidemia type  4. Colon cancer screening - Cologuard   Health Maintenance reviewed Diet and exercise encouraged  Follow up plan: 6 months   Evelina Dun, FNP

## 2022-10-03 NOTE — Patient Instructions (Signed)
Hypertension, Adult High blood pressure (hypertension) is when the force of blood pumping through the arteries is too strong. The arteries are the blood vessels that carry blood from the heart throughout the body. Hypertension forces the heart to work harder to pump blood and may cause arteries to become narrow or stiff. Untreated or uncontrolled hypertension can lead to a heart attack, heart failure, a stroke, kidney disease, and other problems. A blood pressure reading consists of a higher number over a lower number. Ideally, your blood pressure should be below 120/80. The first ("top") number is called the systolic pressure. It is a measure of the pressure in your arteries as your heart beats. The second ("bottom") number is called the diastolic pressure. It is a measure of the pressure in your arteries as the heart relaxes. What are the causes? The exact cause of this condition is not known. There are some conditions that result in high blood pressure. What increases the risk? Certain factors may make you more likely to develop high blood pressure. Some of these risk factors are under your control, including: Smoking. Not getting enough exercise or physical activity. Being overweight. Having too much fat, sugar, calories, or salt (sodium) in your diet. Drinking too much alcohol. Other risk factors include: Having a personal history of heart disease, diabetes, high cholesterol, or kidney disease. Stress. Having a family history of high blood pressure and high cholesterol. Having obstructive sleep apnea. Age. The risk increases with age. What are the signs or symptoms? High blood pressure may not cause symptoms. Very high blood pressure (hypertensive crisis) may cause: Headache. Fast or irregular heartbeats (palpitations). Shortness of breath. Nosebleed. Nausea and vomiting. Vision changes. Severe chest pain, dizziness, and seizures. How is this diagnosed? This condition is diagnosed by  measuring your blood pressure while you are seated, with your arm resting on a flat surface, your legs uncrossed, and your feet flat on the floor. The cuff of the blood pressure monitor will be placed directly against the skin of your upper arm at the level of your heart. Blood pressure should be measured at least twice using the same arm. Certain conditions can cause a difference in blood pressure between your right and left arms. If you have a high blood pressure reading during one visit or you have normal blood pressure with other risk factors, you may be asked to: Return on a different day to have your blood pressure checked again. Monitor your blood pressure at home for 1 week or longer. If you are diagnosed with hypertension, you may have other blood or imaging tests to help your health care provider understand your overall risk for other conditions. How is this treated? This condition is treated by making healthy lifestyle changes, such as eating healthy foods, exercising more, and reducing your alcohol intake. You may be referred for counseling on a healthy diet and physical activity. Your health care provider may prescribe medicine if lifestyle changes are not enough to get your blood pressure under control and if: Your systolic blood pressure is above 130. Your diastolic blood pressure is above 80. Your personal target blood pressure may vary depending on your medical conditions, your age, and other factors. Follow these instructions at home: Eating and drinking  Eat a diet that is high in fiber and potassium, and low in sodium, added sugar, and fat. An example of this eating plan is called the DASH diet. DASH stands for Dietary Approaches to Stop Hypertension. To eat this way: Eat   plenty of fresh fruits and vegetables. Try to fill one half of your plate at each meal with fruits and vegetables. Eat whole grains, such as whole-wheat pasta, brown rice, or whole-grain bread. Fill about one  fourth of your plate with whole grains. Eat or drink low-fat dairy products, such as skim milk or low-fat yogurt. Avoid fatty cuts of meat, processed or cured meats, and poultry with skin. Fill about one fourth of your plate with lean proteins, such as fish, chicken without skin, beans, eggs, or tofu. Avoid pre-made and processed foods. These tend to be higher in sodium, added sugar, and fat. Reduce your daily sodium intake. Many people with hypertension should eat less than 1,500 mg of sodium a day. Do not drink alcohol if: Your health care provider tells you not to drink. You are pregnant, may be pregnant, or are planning to become pregnant. If you drink alcohol: Limit how much you have to: 0-1 drink a day for women. 0-2 drinks a day for men. Know how much alcohol is in your drink. In the [place] one drink equals one 12 oz bottle of beer (355 mL), one 5 oz glass of wine (148 mL), or one 1 oz glass of hard liquor (44 mL). Lifestyle  Work with your health care provider to maintain a healthy body weight or to lose weight. Ask what an ideal weight is for you. Get at least 30 minutes of exercise that causes your heart to beat faster (aerobic exercise) most days of the week. Activities may include walking, swimming, or biking. Include exercise to strengthen your muscles (resistance exercise), such as Pilates or lifting weights, as part of your weekly exercise routine. Try to do these types of exercises for 30 minutes at least 3 days a week. Do not use any products that contain nicotine or tobacco. These products include cigarettes, chewing tobacco, and vaping devices, such as e-cigarettes. If you need help quitting, ask your health care provider. Monitor your blood pressure at home as told by your health care provider. Keep all follow-up visits. This is important. Medicines Take over-the-counter and prescription medicines only as told by your health care provider. Follow directions carefully. Blood  pressure medicines must be taken as prescribed. Do not skip doses of blood pressure medicine. Doing this puts you at risk for problems and can make the medicine less effective. Ask your health care provider about side effects or reactions to medicines that you should watch for. Contact a health care provider if you: Think you are having a reaction to a medicine you are taking. Have headaches that keep coming back (recurring). Feel dizzy. Have swelling in your ankles. Have trouble with your vision. Get help right away if you: Develop a severe headache or confusion. Have unusual weakness or numbness. Feel faint. Have severe pain in your chest or abdomen. Vomit repeatedly. Have trouble breathing. These symptoms may be an emergency. Get help right away. Call 911. Do not wait to see if the symptoms will go away. Do not drive yourself to the hospital. Summary Hypertension is when the force of blood pumping through your arteries is too strong. If this condition is not controlled, it may put you at risk for serious complications. Your personal target blood pressure may vary depending on your medical conditions, your age, and other factors. For most people, a normal blood pressure is less than 120/80. Hypertension is treated with lifestyle changes, medicines, or a combination of both. Lifestyle changes include losing weight, eating a healthy,   low-sodium diet, exercising more, and limiting alcohol. This information is not intended to replace advice given to you by your health care provider. Make sure you discuss any questions you have with your health care provider. Document Revised: 07/20/2021 Document Reviewed: 07/20/2021 Elsevier Patient Education  2023 [employer]  

## 2023-02-22 ENCOUNTER — Other Ambulatory Visit: Payer: Self-pay | Admitting: Family

## 2023-02-22 DIAGNOSIS — I10 Essential (primary) hypertension: Secondary | ICD-10-CM

## 2023-04-03 ENCOUNTER — Ambulatory Visit: Payer: Commercial Managed Care - PPO | Admitting: Family

## 2023-04-03 ENCOUNTER — Other Ambulatory Visit: Payer: Self-pay | Admitting: Family

## 2023-04-03 ENCOUNTER — Encounter: Payer: Self-pay | Admitting: Family

## 2023-04-03 VITALS — BP 134/86 | HR 73 | Temp 98.1°F | Ht 75.0 in | Wt 350.0 lb

## 2023-04-03 DIAGNOSIS — M1A09X0 Idiopathic chronic gout, multiple sites, without tophus (tophi): Secondary | ICD-10-CM | POA: Diagnosis not present

## 2023-04-03 DIAGNOSIS — M109 Gout, unspecified: Secondary | ICD-10-CM | POA: Insufficient documentation

## 2023-04-03 DIAGNOSIS — I10 Essential (primary) hypertension: Secondary | ICD-10-CM

## 2023-04-03 DIAGNOSIS — E785 Hyperlipidemia, unspecified: Secondary | ICD-10-CM

## 2023-04-03 MED ORDER — LISINOPRIL 40 MG PO TABS: 40.0000 mg | ORAL_TABLET | Freq: Every day | ORAL | 3 refills | Status: DC

## 2023-04-03 MED ORDER — ROSUVASTATIN CALCIUM 5 MG PO TABS: 5.0000 mg | ORAL_TABLET | Freq: Every day | ORAL | 3 refills | Status: DC

## 2023-04-03 MED ORDER — ALLOPURINOL 100 MG PO TABS: ORAL_TABLET | ORAL | 3 refills | Status: DC

## 2023-04-03 MED ORDER — COLCHICINE 0.6 MG PO TABS: ORAL_TABLET | ORAL | 2 refills | Status: DC

## 2023-04-03 MED ORDER — AMLODIPINE BESYLATE 10 MG PO TABS: 10.0000 mg | ORAL_TABLET | Freq: Every day | ORAL | 1 refills | Status: DC

## 2023-04-03 NOTE — Progress Notes (Signed)
Subjective:    Patient ID: Bobby Ortiz, male    DOB: [birth year], 62 y.o.   MRN: [id number]  Chief Complaint  Patient presents with   Medical Management of Chronic Issues   Pt presents to the office today for chronic follow up. He has gout and takes allopurinol 100 mg and uses colchicine as needed. He can not remember his last flare up.   Hypertension This is a chronic problem. The current episode started more than 1 year ago. The problem has been resolved since onset. The problem is controlled. Pertinent negatives include no malaise/fatigue, peripheral edema or shortness of breath. Risk factors for coronary artery disease include dyslipidemia, male gender and sedentary lifestyle. The current treatment provides moderate improvement.  Hyperlipidemia This is a chronic problem. The current episode started more than 1 year ago. The problem is controlled. Exacerbating diseases include obesity. Pertinent negatives include no shortness of breath. Current antihyperlipidemic treatment includes statins. The current treatment provides moderate improvement of lipids. Risk factors for coronary artery disease include dyslipidemia, hypertension and a sedentary lifestyle.      Review of Systems  Constitutional:  Negative for malaise/fatigue.  Respiratory:  Negative for shortness of breath.   All other systems reviewed and are negative.      Objective:   Physical Exam Vitals reviewed.  Constitutional:      General: He is not in acute distress.    Appearance: He is well-developed. He is obese.  HENT:     Head: Normocephalic.     Right Ear: Tympanic membrane normal.     Left Ear: Tympanic membrane normal.  Eyes:     General:        Right eye: No discharge.        Left eye: No discharge.     Pupils: Pupils are equal, round, and reactive to light.  Neck:     Thyroid: No thyromegaly.  Cardiovascular:     Rate and Rhythm: Normal rate and regular rhythm.     Heart sounds: Normal heart sounds.  No murmur heard. Pulmonary:     Effort: Pulmonary effort is normal. No respiratory distress.     Breath sounds: Normal breath sounds. No wheezing.  Abdominal:     General: Bowel sounds are normal. There is no distension.     Palpations: Abdomen is soft.     Tenderness: There is no abdominal tenderness.  Musculoskeletal:        General: No tenderness. Normal range of motion.     Cervical back: Normal range of motion and neck supple.     Right lower leg: Edema (trace) present.     Left lower leg: Edema (trace) present.  Skin:    General: Skin is warm and dry.     Findings: No erythema or rash.  Neurological:     Mental Status: He is alert and oriented to person, place, and time.     Cranial Nerves: No cranial nerve deficit.     Deep Tendon Reflexes: Reflexes are normal and symmetric.  Psychiatric:        Behavior: Behavior normal.        Thought Content: Thought content normal.        Judgment: Judgment normal.      BP 134/86   Pulse 73   Temp 98.1 F (36.7 C) (Temporal)   Ht 6\' 3"  (1.905 m)   Wt (!) 350 lb (158.8 kg)   BMI 43.75 kg/m       Assessment &  Plan:  [name] comes in today with chief complaint of Medical Management of Chronic Issues   Diagnosis and orders addressed:  1. Chronic gout of multiple sites, unspecified cause - allopurinol (ZYLOPRIM) 100 MG tablet; TAKE ONE (1) TABLET EACH DAY  Dispense: 90 tablet; Refill: 3 - colchicine 0.6 MG tablet; Take 0.6mg  by mouth twice daily as needed for gout attack. (may take every two hours up to 6 doses at acute onset)  Dispense: 20 tablet; Refill: 2 - CMP14+EGFR  2. Primary hypertension - amLODipine (NORVASC) 10 MG tablet; Take 1 tablet (10 mg total) by mouth daily.  Dispense: 90 tablet; Refill: 1 - lisinopril (ZESTRIL) 40 MG tablet; Take 1 tablet (40 mg total) by mouth daily.  Dispense: 90 tablet; Refill: 3 - CMP14+EGFR  3. Hyperlipidemia, unspecified hyperlipidemia type - rosuvastatin (CRESTOR) 5 MG  tablet; Take 1 tablet (5 mg total) by mouth daily.  Dispense: 90 tablet; Refill: 3 - CMP14+EGFR  4. Morbid obesity ([facility]) - CMP14+EGFR   Labs pending Continue medications  Health Maintenance reviewed Diet and exercise encouraged  Follow up plan: 6 months    Jannifer Rodney, FNP

## 2023-04-03 NOTE — Patient Instructions (Signed)
Health Maintenance, Male Adopting a healthy lifestyle and getting preventive care are important in promoting health and wellness. Ask your health care provider about: The right schedule for you to have regular tests and exams. Things you can do on your own to prevent diseases and keep yourself healthy. What should I know about diet, weight, and exercise? Eat a healthy diet  Eat a diet that includes plenty of vegetables, fruits, low-fat dairy products, and lean protein. Do not eat a lot of foods that are high in solid fats, added sugars, or sodium. Maintain a healthy weight Body mass index (BMI) is a measurement that can be used to identify possible weight problems. It estimates body fat based on height and weight. Your health care provider can help determine your BMI and help you achieve or maintain a healthy weight. Get regular exercise Get regular exercise. This is one of the most important things you can do for your health. Most adults should: Exercise for at least 150 minutes each week. The exercise should increase your heart rate and make you sweat (moderate-intensity exercise). Do strengthening exercises at least twice a week. This is in addition to the moderate-intensity exercise. Spend less time sitting. Even light physical activity can be beneficial. Watch cholesterol and blood lipids Have your blood tested for lipids and cholesterol at 62 years of age, then have this test every 5 years. You may need to have your cholesterol levels checked more often if: Your lipid or cholesterol levels are high. You are older than 62 years of age. You are at high risk for heart disease. What should I know about cancer screening? Many types of cancers can be detected early and may often be prevented. Depending on your health history and family history, you may need to have cancer screening at various ages. This may include screening for: Colorectal cancer. Prostate cancer. Skin cancer. Lung  cancer. What should I know about heart disease, diabetes, and high blood pressure? Blood pressure and heart disease High blood pressure causes heart disease and increases the risk of stroke. This is more likely to develop in people who have high blood pressure readings or are overweight. Talk with your health care provider about your target blood pressure readings. Have your blood pressure checked: Every 3-5 years if you are 18-39 years of age. Every year if you are 40 years old or older. If you are between the ages of 65 and 75 and are a current or former smoker, ask your health care provider if you should have a one-time screening for abdominal aortic aneurysm (AAA). Diabetes Have regular diabetes screenings. This checks your fasting blood sugar level. Have the screening done: Once every three years after age 45 if you are at a normal weight and have a low risk for diabetes. More often and at a younger age if you are overweight or have a high risk for diabetes. What should I know about preventing infection? Hepatitis B If you have a higher risk for hepatitis B, you should be screened for this virus. Talk with your health care provider to find out if you are at risk for hepatitis B infection. Hepatitis C Blood testing is recommended for: Everyone born from 1945 through 1965. Anyone with known risk factors for hepatitis C. Sexually transmitted infections (STIs) You should be screened each year for STIs, including gonorrhea and chlamydia, if: You are sexually active and are younger than 62 years of age. You are older than 62 years of age and your   health care provider tells you that you are at risk for this type of infection. Your sexual activity has changed since you were last screened, and you are at increased risk for chlamydia or gonorrhea. Ask your health care provider if you are at risk. Ask your health care provider about whether you are at high risk for HIV. Your health care provider  may recommend a prescription medicine to help prevent HIV infection. If you choose to take medicine to prevent HIV, you should first get tested for HIV. You should then be tested every 3 months for as long as you are taking the medicine. Follow these instructions at home: Alcohol use Do not drink alcohol if your health care provider tells you not to drink. If you drink alcohol: Limit how much you have to 0-2 drinks a day. Know how much alcohol is in your drink. In the [place] one drink equals one 12 oz bottle of beer (355 mL), one 5 oz glass of wine (148 mL), or one 1 oz glass of hard liquor (44 mL). Lifestyle Do not use any products that contain nicotine or tobacco. These products include cigarettes, chewing tobacco, and vaping devices, such as e-cigarettes. If you need help quitting, ask your health care provider. Do not use street drugs. Do not share needles. Ask your health care provider for help if you need support or information about quitting drugs. General instructions Schedule regular health, dental, and eye exams. Stay current with your vaccines. Tell your health care provider if: You often feel depressed. You have ever been abused or do not feel safe at home. Summary Adopting a healthy lifestyle and getting preventive care are important in promoting health and wellness. Follow your health care provider's instructions about healthy diet, exercising, and getting tested or screened for diseases. Follow your health care provider's instructions on monitoring your cholesterol and blood pressure. This information is not intended to replace advice given to you by your health care provider. Make sure you discuss any questions you have with your health care provider. Document Revised: 02/01/2021 Document Reviewed: 02/01/2021 Elsevier Patient Education  2024 Elsevier Inc.  

## 2023-04-04 LAB — CMP14+EGFR
ALT: 18 IU/L (ref 0–44)
AST: 13 IU/L (ref 0–40)
Albumin: 4.6 g/dL (ref 3.9–4.9)
Alkaline Phosphatase: 97 IU/L (ref 44–121)
BUN/Creatinine Ratio: 14 (ref 10–24)
BUN: 19 mg/dL (ref 8–27)
Bilirubin Total: <0.2 mg/dL (ref 0.0–1.2)
CO2: 21 mmol/L (ref 20–29)
Calcium: 10 mg/dL (ref 8.6–10.2)
Chloride: 103 mmol/L (ref 96–106)
Creatinine, Ser: 1.34 mg/dL — ABNORMAL HIGH (ref 0.76–1.27)
Globulin, Total: 2.6 g/dL (ref 1.5–4.5)
Glucose: 130 mg/dL — ABNORMAL HIGH (ref 70–99)
Potassium: 4.7 mmol/L (ref 3.5–5.2)
Sodium: 139 mmol/L (ref 134–144)
Total Protein: 7.2 g/dL (ref 6.0–8.5)
eGFR: 60 mL/min/{1.73_m2} (ref >59)

## 2023-04-05 LAB — SPECIMEN STATUS REPORT

## 2023-04-06 ENCOUNTER — Other Ambulatory Visit: Payer: Self-pay | Admitting: Family

## 2023-04-06 ENCOUNTER — Other Ambulatory Visit: Payer: Self-pay | Admitting: Family Medicine

## 2023-04-06 DIAGNOSIS — R7309 Other abnormal glucose: Secondary | ICD-10-CM

## 2023-04-07 ENCOUNTER — Other Ambulatory Visit: Payer: Commercial Managed Care - PPO

## 2023-04-07 DIAGNOSIS — R7309 Other abnormal glucose: Secondary | ICD-10-CM

## 2023-04-07 LAB — BAYER DCA HB A1C WAIVED: HB A1C (BAYER DCA - WAIVED): 6.7 % — ABNORMAL HIGH (ref 4.8–5.6)

## 2023-04-10 ENCOUNTER — Other Ambulatory Visit: Payer: Self-pay | Admitting: Family

## 2023-04-10 DIAGNOSIS — E119 Type 2 diabetes mellitus without complications: Secondary | ICD-10-CM | POA: Insufficient documentation

## 2023-04-10 DIAGNOSIS — E1169 Type 2 diabetes mellitus with other specified complication: Secondary | ICD-10-CM

## 2023-04-12 ENCOUNTER — Telehealth: Payer: Self-pay

## 2023-04-12 NOTE — Progress Notes (Signed)
   Care Guide Note  04/12/2023 Name: Shamir Tuzzolino MRN: [id number] DOB: 08-31-1961  Referred by: Junie Spencer, FNP Reason for referral : Care Coordination (Outreach to schedule with Pharm d )   Kalani Sthilaire is a 62 y.o. year old male who is a primary care patient of Junie Spencer, FNP. Dawon Troop was referred to the pharmacist for assistance related to DM.    Successful contact was made with the patient to discuss pharmacy services including being ready for the pharmacist to call at least 5 minutes before the scheduled appointment time, to have medication bottles and any blood sugar or blood pressure readings ready for review. The patient agreed to meet with the pharmacist via with the pharmacist via telephone visit on (date/time).  05/16/2023   Penne Lash, RMA Care Guide [facility]  [place], [place] [address] Direct Dial: [phone] [email or web address]

## 2023-05-16 ENCOUNTER — Ambulatory Visit (INDEPENDENT_AMBULATORY_CARE_PROVIDER_SITE_OTHER): Payer: Commercial Managed Care - PPO | Admitting: Pharmacist

## 2023-05-16 DIAGNOSIS — E1169 Type 2 diabetes mellitus with other specified complication: Secondary | ICD-10-CM

## 2023-05-16 NOTE — Progress Notes (Signed)
05/16/2023 Name: Jaimie Edgecomb MRN: [id number] DOB: September 10, 1961  Chief Complaint  Patient presents with   Diabetes    Bobby Ortiz is a 62 y.o. year old male who presented for a telephone visit. I connected with  Bobby Ortiz on 05/16/23 by telephone and verified that I am speaking with the correct person using two identifiers.  I discussed the limitations of evaluation and management by telemedicine. The patient expressed understanding and agreed to proceed.  Patient was located in her home and PharmD in PCP office during this visit.    They were referred to the pharmacist by their PCP for assistance in managing diabetes. PMH includes HTN, gout, HLD, and T2DM newly diagnosed in July 2024.   Subjective: Wife is a Engineer, civil (consulting) - helped him know what kind of diet changes to make to help control BG.    Care Team: Primary Care Provider: Junie Spencer, FNP ; Next Scheduled Visit: 06/12/23  Medication Access/Adherence  Current Pharmacy:  [facility],  - [address] [place] [address] Phone: [phone] Fax: [phone]   Patient reports affordability concerns with their medications: No  Patient reports access/transportation concerns to their pharmacy: No  Patient reports adherence concerns with their medications:  No    Diabetes:  Current medications: none Medications tried in the past: none  Not checking blood sugars  Patient denies hyperglycemic symptoms including polyuria, polydipsia, polyphagia, nocturia, neuropathy, blurred vision.  Current meal patterns:  Cut back on bread, sugars (drinks), no pasta.  Focusing on protein.  Trying to focus on lifestyle to manage DM vs starting medication.  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   Current medication access support: none- [employer] commercial insurance  Objective:  Lab Results  Component Value Date   HGBA1C 6.7 (H)  04/07/2023    Lab Results  Component Value Date   CREATININE 1.34 (H) 04/03/2023   BUN 19 04/03/2023   NA 139 04/03/2023   K 4.7 04/03/2023   CL 103 04/03/2023   CO2 21 04/03/2023    Lab Results  Component Value Date   CHOL 172 09/12/2022   HDL 38 (L) 09/12/2022   LDLCALC 96 09/12/2022   TRIG 221 (H) 09/12/2022   CHOLHDL 4.5 09/12/2022    Medications Reviewed Today     Reviewed by Particia Lather, RPH (Pharmacist) on 05/16/23 at 1607  Med List Status: <None>   Medication Order Taking? Sig Documenting Provider Last Dose Status Informant  allopurinol (ZYLOPRIM) 100 MG tablet [id number] Yes TAKE ONE (1) TABLET EACH DAY Hawks, [place] A, FNP Taking Active   amLODipine (NORVASC) 10 MG tablet [id number] Yes Take 1 tablet (10 mg total) by mouth daily. Bobby Spencer, FNP Taking Active   aspirin EC 81 MG tablet [id number] Yes Take 81 mg by mouth daily. [provider] Taking Active Spouse/Significant Other  colchicine 0.6 MG tablet [id number] Yes Take 0.6mg  by mouth twice daily as needed for gout attack. (may take every two hours up to 6 doses at acute onset) Bobby Spencer, FNP Taking Active   lisinopril (ZESTRIL) 40 MG tablet [id number] Yes Take 1 tablet (40 mg total) by mouth daily. Bobby Spencer, FNP Taking Active   rosuvastatin (CRESTOR) 5 MG tablet [id number] Yes Take 1 tablet (5 mg total) by mouth daily. Bobby Spencer, FNP Taking Active   TART CHERRY PO [id number] Yes Take 1 capsule by mouth daily. [provider] Taking Active Spouse/Significant Other            Assessment/Plan:   Diabetes: - Currently controlled with A1c of 6.7% below goal of <7%, recently diagnosed in July of 2024. Pt would be a great candidate for a GLP-1 or GLP-1/GIP RA in the future given metabolic syndrome and BMI of 43 - however pt prefers to focus on lifestyle changes for 3 months and follow-up after A1c check in ~2 months to decide about initiating medication. Discussed  risk/benefit of medications including GLP-1RAs and educated on progressive nature of T2DM. - Reviewed long term cardiovascular and renal outcomes of uncontrolled blood sugar - Reviewed goal A1c, goal fasting, and goal 2 hour post prandial glucose - Reviewed dietary modifications including using the healthy plate method, reducing intake of carbohydrate heavy foods, emphasizing protein.  - Reviewed lifestyle modifications including: increasing physical activity, increasing water intake.  - Recommend to recheck A1c 07/08/23    Follow Up Plan: PCP 06/12/23, Pharmacist telephone 07/04/23 (may push back if A1c not checked yet)  Nils Pyle, PharmD PGY1 Pharmacy Resident   Kieth Brightly, PharmD, BCACP Clinical Pharmacist, [facility]

## 2023-06-12 ENCOUNTER — Ambulatory Visit: Payer: Commercial Managed Care - PPO | Admitting: Family

## 2023-06-30 ENCOUNTER — Other Ambulatory Visit: Payer: Self-pay | Admitting: Family

## 2023-06-30 DIAGNOSIS — Z1211 Encounter for screening for malignant neoplasm of colon: Secondary | ICD-10-CM

## 2023-06-30 DIAGNOSIS — Z1212 Encounter for screening for malignant neoplasm of rectum: Secondary | ICD-10-CM

## 2023-07-10 ENCOUNTER — Encounter: Payer: Self-pay | Admitting: Family

## 2023-07-10 ENCOUNTER — Ambulatory Visit: Payer: Commercial Managed Care - PPO | Admitting: Family

## 2023-07-10 VITALS — BP 129/77 | HR 75 | Temp 97.9°F | Ht 75.0 in | Wt 342.0 lb

## 2023-07-10 DIAGNOSIS — E1169 Type 2 diabetes mellitus with other specified complication: Secondary | ICD-10-CM | POA: Diagnosis not present

## 2023-07-10 DIAGNOSIS — I10 Essential (primary) hypertension: Secondary | ICD-10-CM | POA: Diagnosis not present

## 2023-07-10 DIAGNOSIS — E785 Hyperlipidemia, unspecified: Secondary | ICD-10-CM

## 2023-07-10 MED ORDER — BLOOD GLUCOSE MONITORING SUPPL DEVI
1.0000 | Freq: Three times a day (TID) | 0 refills | Status: AC
Start: 2023-07-10 — End: ?

## 2023-07-10 MED ORDER — LANCET DEVICE MISC
1.0000 | Freq: Three times a day (TID) | 0 refills | Status: AC
Start: 2023-07-10 — End: 2023-08-09

## 2023-07-10 MED ORDER — BLOOD GLUCOSE TEST VI STRP
1.0000 | ORAL_STRIP | Freq: Three times a day (TID) | 0 refills | Status: AC
Start: 2023-07-10 — End: 2023-08-09

## 2023-07-10 MED ORDER — LANCETS MISC. MISC
1.0000 | Freq: Three times a day (TID) | 0 refills | Status: AC
Start: 2023-07-10 — End: 2023-08-09

## 2023-07-10 NOTE — Patient Instructions (Signed)
Diabetes Mellitus Basics  Diabetes mellitus, or diabetes, is a long-term (chronic) disease. It occurs when the body does not properly use sugar (glucose) that is released from food after you eat. Diabetes mellitus may be caused by one or both of these problems: Your pancreas does not make enough of a hormone called insulin. Your body does not react in a normal way to the insulin that it makes. Insulin lets glucose enter cells in your body. This gives you energy. If you have diabetes, glucose cannot get into cells. This causes high blood glucose (hyperglycemia). How to treat and manage diabetes You may need to take insulin or other diabetes medicines daily to keep your glucose in balance. If you are prescribed insulin, you will learn how to give yourself insulin by injection. You may need to adjust the amount of insulin you take based on the foods that you eat. You will need to check your blood glucose levels using a glucose monitor as told by your health care provider. The readings can help determine if you have low or high blood glucose. Generally, you should have these blood glucose levels: Before meals (preprandial): 80-130 mg/dL (3.3-8.2 mmol/L). After meals (postprandial): below 180 mg/dL (10 mmol/L). Hemoglobin A1c (HbA1c) level: less than 7%. Your health care provider will set treatment goals for you. Keep all follow-up visits. This is important. Follow these instructions at home: Diabetes medicines Take your diabetes medicines every day as told by your health care provider. List your diabetes medicines here: Name of medicine: ______________________________ Amount (dose): _______________ Time (a.m./p.m.): _______________ Notes: ___________________________________ Name of medicine: ______________________________ Amount (dose): _______________ Time (a.m./p.m.): _______________ Notes: ___________________________________ Name of medicine: ______________________________ Amount (dose):  _______________ Time (a.m./p.m.): _______________ Notes: ___________________________________ Insulin If you use insulin, list the types of insulin you use here: Insulin type: ______________________________ Amount (dose): _______________ Time (a.m./p.m.): _______________Notes: ___________________________________ Insulin type: ______________________________ Amount (dose): _______________ Time (a.m./p.m.): _______________ Notes: ___________________________________ Insulin type: ______________________________ Amount (dose): _______________ Time (a.m./p.m.): _______________ Notes: ___________________________________ Insulin type: ______________________________ Amount (dose): _______________ Time (a.m./p.m.): _______________ Notes: ___________________________________ Insulin type: ______________________________ Amount (dose): _______________ Time (a.m./p.m.): _______________ Notes: ___________________________________ Managing blood glucose  Check your blood glucose levels using a glucose monitor as told by your health care provider. Write down the times that you check your glucose levels here: Time: _______________ Notes: ___________________________________ Time: _______________ Notes: ___________________________________ Time: _______________ Notes: ___________________________________ Time: _______________ Notes: ___________________________________ Time: _______________ Notes: ___________________________________ Time: _______________ Notes: ___________________________________  Low blood glucose Low blood glucose (hypoglycemia) is when glucose is at or below 70 mg/dL (3.9 mmol/L). Symptoms may include: Feeling: Hungry. Sweaty and clammy. Irritable or easily upset. Dizzy. Sleepy. Having: A fast heartbeat. A headache. A change in your vision. Numbness around the mouth, lips, or tongue. Having trouble with: Moving (coordination). Sleeping. Treating low blood glucose To treat low blood  glucose, eat or drink something containing sugar right away. If you can think clearly and swallow safely, follow the 15:15 rule: Take 15 grams of a fast-acting carb (carbohydrate), as told by your health care provider. Some fast-acting carbs are: Glucose tablets: take 3-4 tablets. Hard candy: eat 3-5 pieces. Fruit juice: drink 4 oz (120 mL). Regular (not diet) soda: drink 4-6 oz (120-180 mL). Honey or sugar: eat 1 Tbsp (15 mL). Check your blood glucose levels 15 minutes after you take the carb. If your glucose is still at or below 70 mg/dL (3.9 mmol/L), take 15 grams of a carb again. If your glucose does not go above 70 mg/dL (3.9 mmol/L) after  3 tries, get help right away. After your glucose goes back to normal, eat a meal or a snack within 1 hour. Treating very low blood glucose If your glucose is at or below 54 mg/dL (3 mmol/L), you have very low blood glucose (severe hypoglycemia). This is an emergency. Do not wait to see if the symptoms will go away. Get medical help right away. Call your local emergency services (911 in the [place] Do not drive yourself to the hospital. Questions to ask your health care provider Should I talk with a diabetes educator? What equipment will I need to care for myself at home? What diabetes medicines do I need? When should I take them? How often do I need to check my blood glucose levels? What number can I call if I have questions? When is my follow-up visit? Where can I find a support group for people with diabetes? Where to find more information American Diabetes Association: www.diabetes.org Association of Diabetes Care and Education Specialists: www.diabeteseducator.org Contact a health care provider if: Your blood glucose is at or above 240 mg/dL (65.7 mmol/L) for 2 days in a row. You have been sick or have had a fever for 2 days or more, and you are not getting better. You have any of these problems for more than 6 hours: You cannot eat or  drink. You feel nauseous. You vomit. You have diarrhea. Get help right away if: Your blood glucose is lower than 54 mg/dL (3 mmol/L). You get confused. You have trouble thinking clearly. You have trouble breathing. These symptoms may represent a serious problem that is an emergency. Do not wait to see if the symptoms will go away. Get medical help right away. Call your local emergency services (911 in the [place] Do not drive yourself to the hospital. Summary Diabetes mellitus is a chronic disease that occurs when the body does not properly use sugar (glucose) that is released from food after you eat. Take insulin and diabetes medicines as told. Check your blood glucose every day, as often as told. Keep all follow-up visits. This is important. This information is not intended to replace advice given to you by your health care provider. Make sure you discuss any questions you have with your health care provider. Document Revised: 01/14/2020 Document Reviewed: 01/14/2020 Elsevier Patient Education  2024 [employer].

## 2023-07-10 NOTE — Progress Notes (Signed)
Subjective:    Patient ID: Bobby Ortiz, male    DOB: 02-09-1961, 62 y.o.   MRN: [id number]  Chief Complaint  Patient presents with   Diabetes   Pt presents to the office today for chronic follow up and new DM follow up. He has gout and takes allopurinol 100 mg and uses colchicine as needed. He can not remember his last flare up.    Diabetes He presents for his follow-up diabetic visit. He has type 2 diabetes mellitus. Pertinent negatives for diabetes include no blurred vision and no foot paresthesias. Risk factors for coronary artery disease include dyslipidemia, hypertension, sedentary lifestyle and diabetes mellitus. He is following a generally unhealthy diet. (Does not check glucose at home)  Hypertension This is a chronic problem. The current episode started more than 1 year ago. The problem has been resolved since onset. The problem is controlled. Pertinent negatives include no blurred vision, malaise/fatigue, peripheral edema or shortness of breath. Risk factors for coronary artery disease include dyslipidemia, diabetes mellitus and sedentary lifestyle. The current treatment provides moderate improvement.  Hyperlipidemia This is a chronic problem. The current episode started more than 1 year ago. The problem is controlled. Recent lipid tests were reviewed and are normal. Pertinent negatives include no shortness of breath. Current antihyperlipidemic treatment includes statins. The current treatment provides moderate improvement of lipids. Risk factors for coronary artery disease include dyslipidemia, diabetes mellitus, hypertension and a sedentary lifestyle.      Review of Systems  Constitutional:  Negative for malaise/fatigue.  Eyes:  Negative for blurred vision.  Respiratory:  Negative for shortness of breath.   All other systems reviewed and are negative.      Objective:   Physical Exam Vitals reviewed.  Constitutional:      General: He is not in acute distress.     Appearance: He is well-developed. He is obese.  HENT:     Head: Normocephalic.     Right Ear: Tympanic membrane normal.     Left Ear: Tympanic membrane normal.  Eyes:     General:        Right eye: No discharge.        Left eye: No discharge.     Pupils: Pupils are equal, round, and reactive to light.  Neck:     Thyroid: No thyromegaly.  Cardiovascular:     Rate and Rhythm: Normal rate and regular rhythm.     Heart sounds: Normal heart sounds. No murmur heard. Pulmonary:     Effort: Pulmonary effort is normal. No respiratory distress.     Breath sounds: Normal breath sounds. No wheezing.  Abdominal:     General: Bowel sounds are normal. There is no distension.     Palpations: Abdomen is soft.     Tenderness: There is no abdominal tenderness.  Musculoskeletal:        General: No tenderness. Normal range of motion.     Cervical back: Normal range of motion and neck supple.  Skin:    General: Skin is warm and dry.     Findings: No erythema or rash.  Neurological:     Mental Status: He is alert and oriented to person, place, and time.     Cranial Nerves: No cranial nerve deficit.     Deep Tendon Reflexes: Reflexes are normal and symmetric.  Psychiatric:        Behavior: Behavior normal.        Thought Content: Thought content normal.  Judgment: Judgment normal.      BP 129/77   Pulse 75   Temp 97.9 F (36.6 C) (Temporal)   Ht 6\' 3"  (1.905 m)   Wt (!) 342 lb (155.1 kg)   BMI 42.75 kg/m      Assessment & Plan:  [name] comes in today with chief complaint of Diabetes   Diagnosis and orders addressed:  1. Type 2 diabetes mellitus with other specified complication, without long-term current use of insulin (HCC) If A1C is elevated today will start Ozempic  Low carb diet  - Bayer DCA Hb A1c Waived - CMP14+EGFR - Blood Glucose Monitoring Suppl DEVI; 1 each by Does not apply route in the morning, at noon, and at bedtime. May substitute to any manufacturer  covered by patient's insurance.  Dispense: 1 each; Refill: 0 - Glucose Blood (BLOOD GLUCOSE TEST STRIPS) STRP; 1 each by In Vitro route in the morning, at noon, and at bedtime. May substitute to any manufacturer covered by patient's insurance.  Dispense: 100 strip; Refill: 0 - Lancet Device MISC; 1 each by Does not apply route in the morning, at noon, and at bedtime. May substitute to any manufacturer covered by patient's insurance.  Dispense: 1 each; Refill: 0 - Lancets Misc. MISC; 1 each by Does not apply route in the morning, at noon, and at bedtime. May substitute to any manufacturer covered by patient's insurance.  Dispense: 100 each; Refill: 0  2. Primary hypertension - CMP14+EGFR  3. Morbid obesity (HCC) - CMP14+EGFR  4. Hyperlipidemia, unspecified hyperlipidemia type - CMP14+EGFR   Labs pending If A1C still elevated will start Ozempic  Glucose meter sent  Health Maintenance reviewed Diet and exercise encouraged  Follow up plan: 3 months    Jannifer Rodney, FNP

## 2023-07-11 LAB — CMP14+EGFR
ALT: 30 [IU]/L (ref 0–44)
AST: 25 [IU]/L (ref 0–40)
Albumin: 4.3 g/dL (ref 3.9–4.9)
Alkaline Phosphatase: 105 [IU]/L (ref 44–121)
BUN/Creatinine Ratio: 17 (ref 10–24)
BUN: 23 mg/dL (ref 8–27)
Bilirubin Total: 0.2 mg/dL (ref 0.0–1.2)
CO2: 20 mmol/L (ref 20–29)
Calcium: 9.7 mg/dL (ref 8.6–10.2)
Chloride: 102 mmol/L (ref 96–106)
Creatinine, Ser: 1.33 mg/dL — ABNORMAL HIGH (ref 0.76–1.27)
Globulin, Total: 3 g/dL (ref 1.5–4.5)
Glucose: 141 mg/dL — ABNORMAL HIGH (ref 70–99)
Potassium: 4.4 mmol/L (ref 3.5–5.2)
Sodium: 137 mmol/L (ref 134–144)
Total Protein: 7.3 g/dL (ref 6.0–8.5)
eGFR: 60 mL/min/{1.73_m2} (ref >59)

## 2023-07-11 LAB — BAYER DCA HB A1C WAIVED: HB A1C (BAYER DCA - WAIVED): 6.2 % — ABNORMAL HIGH (ref 4.8–5.6)

## 2023-11-10 ENCOUNTER — Emergency Department (HOSPITAL_BASED_OUTPATIENT_CLINIC_OR_DEPARTMENT_OTHER): Payer: Commercial Managed Care - PPO

## 2023-11-10 ENCOUNTER — Encounter (HOSPITAL_BASED_OUTPATIENT_CLINIC_OR_DEPARTMENT_OTHER): Payer: Self-pay

## 2023-11-10 ENCOUNTER — Emergency Department (HOSPITAL_BASED_OUTPATIENT_CLINIC_OR_DEPARTMENT_OTHER)
Admission: EM | Admit: 2023-11-10 | Discharge: 2023-11-10 | Disposition: A | Discharge status: Home / Self Care | Payer: Commercial Managed Care - PPO | Attending: Emergency Medicine | Admitting: Emergency Medicine

## 2023-11-10 ENCOUNTER — Other Ambulatory Visit: Payer: Self-pay

## 2023-11-10 DIAGNOSIS — I129 Hypertensive chronic kidney disease with stage 1 through stage 4 chronic kidney disease, or unspecified chronic kidney disease: Secondary | ICD-10-CM | POA: Insufficient documentation

## 2023-11-10 DIAGNOSIS — R339 Retention of urine, unspecified: Secondary | ICD-10-CM | POA: Diagnosis not present

## 2023-11-10 DIAGNOSIS — R1030 Lower abdominal pain, unspecified: Secondary | ICD-10-CM | POA: Diagnosis present

## 2023-11-10 DIAGNOSIS — Z79899 Other long term (current) drug therapy: Secondary | ICD-10-CM | POA: Diagnosis not present

## 2023-11-10 DIAGNOSIS — K5900 Constipation, unspecified: Secondary | ICD-10-CM | POA: Insufficient documentation

## 2023-11-10 DIAGNOSIS — Z7982 Long term (current) use of aspirin: Secondary | ICD-10-CM | POA: Diagnosis not present

## 2023-11-10 DIAGNOSIS — N189 Chronic kidney disease, unspecified: Secondary | ICD-10-CM | POA: Diagnosis not present

## 2023-11-10 LAB — CBC WITH DIFFERENTIAL/PLATELET
Abs Immature Granulocytes: 0.03 10*3/uL (ref 0.00–0.07)
Basophils Absolute: 0.1 10*3/uL (ref 0.0–0.1)
Basophils Relative: 1 %
Eosinophils Absolute: 0 10*3/uL (ref 0.0–0.5)
Eosinophils Relative: 0 %
HCT: 43 % (ref 39.0–52.0)
Hemoglobin: 15.2 g/dL (ref 13.0–17.0)
Immature Granulocytes: 0 %
Lymphocytes Relative: 22 %
Lymphs Abs: 2.2 10*3/uL (ref 0.7–4.0)
MCH: 30.9 pg (ref 26.0–34.0)
MCHC: 35.3 g/dL (ref 30.0–36.0)
MCV: 87.4 fL (ref 80.0–100.0)
Monocytes Absolute: 0.5 10*3/uL (ref 0.1–1.0)
Monocytes Relative: 5 %
Neutro Abs: 7.1 10*3/uL (ref 1.7–7.7)
Neutrophils Relative %: 72 %
Platelets: 251 10*3/uL (ref 150–400)
RBC: 4.92 MIL/uL (ref 4.22–5.81)
RDW: 12.8 % (ref 11.5–15.5)
WBC: 9.8 10*3/uL (ref 4.0–10.5)
nRBC: 0 % (ref 0.0–0.2)

## 2023-11-10 LAB — COMPREHENSIVE METABOLIC PANEL
ALT: 28 U/L (ref 0–44)
AST: 25 U/L (ref 15–41)
Albumin: 4.7 g/dL (ref 3.5–5.0)
Alkaline Phosphatase: 79 U/L (ref 38–126)
Anion gap: 9 (ref 5–15)
BUN: 19 mg/dL (ref 8–23)
CO2: 21 mmol/L — ABNORMAL LOW (ref 22–32)
Calcium: 9.2 mg/dL (ref 8.9–10.3)
Chloride: 102 mmol/L (ref 98–111)
Creatinine, Ser: 1.07 mg/dL (ref 0.61–1.24)
GFR, Estimated: >60 mL/min (ref >60)
Glucose, Bld: 182 mg/dL — ABNORMAL HIGH (ref 70–99)
Potassium: 4.4 mmol/L (ref 3.5–5.1)
Sodium: 132 mmol/L — ABNORMAL LOW (ref 135–145)
Total Bilirubin: 0.4 mg/dL (ref 0.0–1.2)
Total Protein: 8 g/dL (ref 6.5–8.1)

## 2023-11-10 LAB — LIPASE, BLOOD: Lipase: 21 U/L (ref 11–51)

## 2023-11-10 LAB — URINALYSIS, ROUTINE W REFLEX MICROSCOPIC
Bacteria, UA: NONE SEEN
Bilirubin Urine: NEGATIVE
Glucose, UA: 250 mg/dL — AB
Hgb urine dipstick: NEGATIVE
Ketones, ur: NEGATIVE mg/dL
Leukocytes,Ua: NEGATIVE
Nitrite: NEGATIVE
Specific Gravity, Urine: 1.025 (ref 1.005–1.030)
pH: 6 (ref 5.0–8.0)

## 2023-11-10 MED ORDER — FLEET ENEMA RE ENEM
1.0000 | ENEMA | Freq: Once | RECTAL | Status: AC
  Administered 2023-11-10: 1 via RECTAL
  Filled 2023-11-10: qty 1

## 2023-11-10 MED ORDER — ONDANSETRON HCL 4 MG/2ML IJ SOLN
4.0000 mg | Freq: Once | INTRAMUSCULAR | Status: AC
  Administered 2023-11-10: 4 mg via INTRAVENOUS
  Filled 2023-11-10: qty 2

## 2023-11-10 MED ORDER — MAGNESIUM CITRATE PO SOLN: 1.0000 | Freq: Once | ORAL | Status: DC

## 2023-11-10 MED ORDER — POLYETHYLENE GLYCOL 3350 17 GM/SCOOP PO POWD: 1.0000 | Freq: Once | ORAL | 0 refills | Status: AC

## 2023-11-10 MED ORDER — HYDROMORPHONE HCL 1 MG/ML IJ SOLN
1.0000 mg | Freq: Once | INTRAMUSCULAR | Status: AC
  Administered 2023-11-10: 1 mg via INTRAVENOUS
  Filled 2023-11-10: qty 1

## 2023-11-10 MED ORDER — HYDROCORTISONE (PERIANAL) 2.5 % EX CREA: 1.0000 | TOPICAL_CREAM | Freq: Two times a day (BID) | CUTANEOUS | 0 refills | Status: AC

## 2023-11-10 MED ORDER — IOHEXOL 300 MG/ML  SOLN
100.0000 mL | Freq: Once | INTRAMUSCULAR | Status: AC | PRN
  Administered 2023-11-10: 100 mL via INTRAVENOUS

## 2023-11-10 MED ORDER — LACTATED RINGERS IV BOLUS
1000.0000 mL | Freq: Once | INTRAVENOUS | Status: AC
  Administered 2023-11-10: 1000 mL via INTRAVENOUS

## 2023-11-10 NOTE — Discharge Instructions (Addendum)
Take the medication for constipation as prescribed.  Increase fluid in your diet.  Follow-up with your primary doctor as well as the urologist for further evaluation of your right kidney abnormality and the general surgeon regarding your hemorrhoids.  Return to the ED with new or worsening symptoms.

## 2023-11-10 NOTE — ED Provider Notes (Signed)
[place] EMERGENCY DEPARTMENT AT [facility] Provider Note   CSN: [id number] Arrival date & time: 11/10/23  0157     History  Chief Complaint  Patient presents with   Constipation    Bobby Ortiz is a 63 y.o. male.  Patient with hypertension, CKD, kidney stones presents with abdominal pain and constipation.  States his last bowel movement was 2 days ago.  Bobby Ortiz was trying to have a bowel movement today straining on the toilet without success.  Developed lower abdominal pain this evening that is spreading in his abdomen diffusely.  Pain is constant.  Worse periumbilically.  Unable to urinate as well and believes Bobby Ortiz is retaining urine secondary to constipation.  Normally does not take anything for his bowels and has a bowel movement every day.  No black or bloody stools.  No vomiting.  No fever.  No chest pain or shortness of breath.  No history of previous Foley catheter use.  Reports there is some swelling near his rectum after Bobby Ortiz was straining very hard at home.  No bleeding.  Does not take any medicines for constipation at home.  The history is provided by the spouse and the patient.  Constipation Associated symptoms: abdominal pain   Associated symptoms: no fever, no nausea and no vomiting        Home Medications Prior to Admission medications   Medication Sig Start Date End Date Taking? Authorizing Provider  allopurinol (ZYLOPRIM) 100 MG tablet TAKE ONE (1) TABLET EACH DAY 04/03/23   Hawks, Neysa Bonito A, FNP  amLODipine (NORVASC) 10 MG tablet Take 1 tablet (10 mg total) by mouth daily. 04/03/23   Jannifer Rodney A, FNP  aspirin EC 81 MG tablet Take 81 mg by mouth daily.    [provider]  Blood Glucose Monitoring Suppl DEVI 1 each by Does not apply route in the morning, at noon, and at bedtime. May substitute to any manufacturer covered by patient's insurance. 07/10/23   Junie Spencer, FNP  colchicine 0.6 MG tablet Take 0.6mg  by mouth twice daily as needed for  gout attack. (may take every two hours up to 6 doses at acute onset) 04/03/23   Jannifer Rodney A, FNP  lisinopril (ZESTRIL) 40 MG tablet Take 1 tablet (40 mg total) by mouth daily. 04/03/23   Junie Spencer, FNP  rosuvastatin (CRESTOR) 5 MG tablet Take 1 tablet (5 mg total) by mouth daily. 04/03/23   Jannifer Rodney A, FNP  TART CHERRY PO Take 1 capsule by mouth daily.    [provider]      Allergies    Patient has no known allergies.    Review of Systems   Review of Systems  Constitutional:  Negative for activity change, appetite change and fever.  HENT:  Negative for congestion and rhinorrhea.   Respiratory:  Negative for cough, chest tightness and shortness of breath.   Gastrointestinal:  Positive for abdominal distention, abdominal pain, constipation and rectal pain. Negative for nausea and vomiting.  Genitourinary:  Positive for difficulty urinating.  Musculoskeletal:  Negative for arthralgias and myalgias.  Skin:  Negative for rash.  Neurological:  Negative for dizziness, weakness and headaches.   all other systems are negative except as noted in the HPI and PMH.    Physical Exam Updated Vital Signs BP 136/83   Pulse 66   Temp 97.9 F (36.6 C)   Resp (!) 22   SpO2 93%  Physical Exam Vitals and nursing note reviewed.  Constitutional:  General: Bobby Ortiz is not in acute distress.    Appearance: Bobby Ortiz is well-developed. Bobby Ortiz is not ill-appearing.  HENT:     Head: Normocephalic and atraumatic.     Mouth/Throat:     Pharynx: No oropharyngeal exudate.  Eyes:     Conjunctiva/sclera: Conjunctivae normal.     Pupils: Pupils are equal, round, and reactive to light.  Neck:     Comments: No meningismus. Cardiovascular:     Rate and Rhythm: Normal rate and regular rhythm.     Heart sounds: Normal heart sounds. No murmur heard. Pulmonary:     Effort: Pulmonary effort is normal. No respiratory distress.     Breath sounds: Normal breath sounds.  Abdominal:     General: There is  distension.     Palpations: Abdomen is soft.     Tenderness: There is abdominal tenderness. There is no guarding or rebound.     Comments: Diffuse tenderness. No guarding or rebound  Genitourinary:    Comments: Firm stool to digital exam just past finger. Unable to reach to disimpact.  Swelling of perianal area at 3 oclock position.  No erythema or induration. Musculoskeletal:        General: No tenderness. Normal range of motion.     Cervical back: Normal range of motion and neck supple.  Skin:    General: Skin is warm.  Neurological:     Mental Status: Bobby Ortiz is alert and oriented to person, place, and time.     Cranial Nerves: No cranial nerve deficit.     Motor: No abnormal muscle tone.     Coordination: Coordination normal.     Comments:  5/5 strength throughout. CN 2-12 intact.Equal grip strength.   Psychiatric:        Behavior: Behavior normal.     ED Results / Procedures / Treatments   Labs (all labs ordered are listed, but only abnormal results are displayed) Labs Reviewed  COMPREHENSIVE METABOLIC PANEL - Abnormal; Notable for the following components:      Result Value   Sodium 132 (*)    CO2 21 (*)    Glucose, Bld 182 (*)    All other components within normal limits  URINALYSIS, ROUTINE W REFLEX MICROSCOPIC - Abnormal; Notable for the following components:   Glucose, UA 250 (*)    Protein, ur TRACE (*)    All other components within normal limits  URINE CULTURE  CBC WITH DIFFERENTIAL/PLATELET  LIPASE, BLOOD  OCCULT BLOOD X 1 CARD TO LAB, STOOL    EKG None  Radiology CT ABDOMEN PELVIS W CONTRAST Result Date: 11/10/2023 CLINICAL DATA:  Acute nonlocalized abdominal pain, constipation EXAM: CT ABDOMEN AND PELVIS WITH CONTRAST TECHNIQUE: Multidetector CT imaging of the abdomen and pelvis was performed using the standard protocol following bolus administration of intravenous contrast. RADIATION DOSE REDUCTION: This exam was performed according to the departmental  dose-optimization program which includes automated exposure control, adjustment of the mA and/or kV according to patient size and/or use of iterative reconstruction technique. CONTRAST:  OMNIPAQUE IOHEXOL 300 MG/ML  SOLN COMPARISON:  10/13/2021 FINDINGS: Lower chest: No acute abnormality. Hepatobiliary: Status post cholecystectomy. Mild hepatic steatosis. No enhancing intrahepatic mass. No intra or extrahepatic biliary ductal dilation. Pancreas: Unremarkable Spleen: Unremarkable Adrenals/Urinary Tract: The adrenal glands are unremarkable. The kidneys are normal in size and position. There is mildly delayed right renal cortical enhancement of unclear etiology. Multiple simple parapelvic cysts are seen on the right for which no follow-up imaging is recommended. There is  no hydronephrosis. There are no intrarenal or ureteral calculi. Foley catheter balloon is in place within a decompressed bladder lumen. Stomach/Bowel: There is moderate stool within the rectal vault without evidence of obstruction. The stomach, small bowel, and large bowel are otherwise unremarkable. Appendix absent. No free intraperitoneal gas or fluid. Vascular/Lymphatic: No significant vascular findings are present. No enlarged abdominal or pelvic lymph nodes. Reproductive: Moderate prostatic hypertrophy Other: Small fat containing bilateral inguinal hernias Musculoskeletal: Osseous structures are age-appropriate. No acute bone abnormality. IMPRESSION: 1. Mildly delayed right renal cortical enhancement of unclear etiology. This can be seen in the setting acute ureteral obstruction, urinary reflux, or ascending urinary tract infection correlation with urinalysis and urine culture may be helpful for further evaluation. 2. Moderate stool within the rectal vault without evidence of obstruction. 3. Mild hepatic steatosis. 4. Moderate prostatic hypertrophy. Electronically Signed   By: Helyn Numbers M.D.   On: 11/10/2023 04:03     Procedures Procedures    Medications Ordered in ED Medications  lactated ringers bolus 1,000 mL (has no administration in time range)  ondansetron (ZOFRAN) injection 4 mg (has no administration in time range)  HYDROmorphone (DILAUDID) injection 1 mg (has no administration in time range)    ED Course/ Medical Decision Making/ A&P                                 Medical Decision Making Amount and/or Complexity of Data Reviewed Labs: ordered. Decision-making details documented in ED Course. Radiology: ordered and independent interpretation performed. Decision-making details documented in ED Course. ECG/medicine tests: ordered and independent interpretation performed. Decision-making details documented in ED Course.  Risk OTC drugs. Prescription drug management.   2 days of lower abdominal pain, constipation and urinary retention.  Stable vitals on arrival.  No distress.  Abdomen soft without peritoneal signs.  Bladder scan 450 cc.  Will place Foley catheter.  Does have firm stool in rectal exam just past examining finger, unable to mainly disimpact.  Foley catheter placed with good success.  Patient drained 500 cc of urine.  Labs reassuring with normal creatinine, LFTs and lipase.  No leukocytosis.  CT scan is negative for bowel obstruction or other acute surgical pathology.  Does show delayed enhancement of left kidney of uncertain etiology.  Urinalysis is negative.  Will send urine culture ` No evidence of obstruction.  Creatinine is normal.  Consider reflux.  Patient was able to have successful bowel movement after enema.  Bobby Ortiz feels much improved.  Will initiate MiraLAX and PCP follow-up with surgery regarding his hemorrhoids.  Bobby Ortiz is requesting removal of his Foley catheter.  Discussed that urinary retention may recur again which can cause kidney damage.  Bobby Ortiz also does have a large prostate on CT scan.  Bobby Ortiz request catheter to be removed and states Bobby Ortiz was wants to urinate on  his own. Bobby Ortiz under stands the catheter might need to be replaced.  Hopefully his urinary retention improves now that Bobby Ortiz is moving his bowels successfully.  Patient made aware of abnormal right kidney findings on CT scan and need for follow-up with urology.  Will initiate bowel regimen.  Follow-up with PCP as well as general surgery regarding his hemorrhoids.  Return to the ED with new or worsening symptoms.       Final Clinical Impression(s) / ED Diagnoses Final diagnoses:  Constipation, unspecified constipation type  Urinary retention    Rx / DC Orders ED Discharge  Orders     None         Glynn Octave, MD 11/10/23 [phone]

## 2023-11-10 NOTE — ED Notes (Signed)
Patient had large BM states feeling better. Denies pain, provider made aware

## 2023-11-10 NOTE — ED Triage Notes (Signed)
Pt reports he is here today due to constipation.Pt reports last bowel movement was 2/12 but states he usually have one every day. Pt reports 10/10 discomfort. Pt reports urinary rentention from the constipation. Pt took OTC meds with no relief.

## 2023-11-10 NOTE — ED Notes (Signed)
Positive results after enema with large formed stool. Patient also states relief

## 2023-11-10 NOTE — ED Notes (Signed)
Patient was able to use urinal to void

## 2023-11-10 NOTE — ED Notes (Signed)
Patient back from CT.

## 2023-11-11 LAB — URINE CULTURE: Culture: NO GROWTH

## 2023-11-20 ENCOUNTER — Other Ambulatory Visit: Payer: Self-pay | Admitting: Family

## 2023-11-20 DIAGNOSIS — I10 Essential (primary) hypertension: Secondary | ICD-10-CM

## 2023-11-20 NOTE — Telephone Encounter (Signed)
 Bobby Ortiz NTBS in April for 6 mos FU RF sent to pharmacy

## 2023-11-21 NOTE — Telephone Encounter (Signed)
 I called pt & made him an appt w/Hawks on 01-09-2024 at 3:25pm for Med refill.

## 2023-12-20 ENCOUNTER — Other Ambulatory Visit: Payer: Self-pay | Admitting: Family

## 2023-12-20 DIAGNOSIS — I10 Essential (primary) hypertension: Secondary | ICD-10-CM

## 2024-01-09 ENCOUNTER — Ambulatory Visit: Payer: Commercial Managed Care - PPO | Admitting: Family

## 2024-01-09 ENCOUNTER — Encounter: Payer: Self-pay | Admitting: Family

## 2024-01-09 VITALS — BP 118/83 | HR 65 | Temp 98.0°F | Ht 75.0 in | Wt 334.7 lb

## 2024-01-09 DIAGNOSIS — Z0000 Encounter for general adult medical examination without abnormal findings: Secondary | ICD-10-CM

## 2024-01-09 DIAGNOSIS — E1169 Type 2 diabetes mellitus with other specified complication: Secondary | ICD-10-CM

## 2024-01-09 DIAGNOSIS — E785 Hyperlipidemia, unspecified: Secondary | ICD-10-CM

## 2024-01-09 DIAGNOSIS — M1A09X0 Idiopathic chronic gout, multiple sites, without tophus (tophi): Secondary | ICD-10-CM

## 2024-01-09 DIAGNOSIS — Z6841 Body Mass Index (BMI) 40.0 and over, adult: Secondary | ICD-10-CM

## 2024-01-09 DIAGNOSIS — Z0001 Encounter for general adult medical examination with abnormal findings: Secondary | ICD-10-CM | POA: Diagnosis not present

## 2024-01-09 DIAGNOSIS — I10 Essential (primary) hypertension: Secondary | ICD-10-CM | POA: Diagnosis not present

## 2024-01-09 LAB — BAYER DCA HB A1C WAIVED: HB A1C (BAYER DCA - WAIVED): 5.6 % (ref 4.8–5.6)

## 2024-01-09 LAB — LIPID PANEL

## 2024-01-09 MED ORDER — ALLOPURINOL 100 MG PO TABS: ORAL_TABLET | ORAL | 3 refills | Status: AC

## 2024-01-09 MED ORDER — COLCHICINE 0.6 MG PO TABS: ORAL_TABLET | ORAL | 2 refills | Status: AC

## 2024-01-09 MED ORDER — LISINOPRIL 40 MG PO TABS
40.0000 mg | ORAL_TABLET | Freq: Every day | ORAL | 3 refills | Status: AC
Start: 2024-01-09 — End: ?

## 2024-01-09 MED ORDER — AMLODIPINE BESYLATE 10 MG PO TABS: 10.0000 mg | ORAL_TABLET | Freq: Every day | ORAL | 3 refills | Status: AC

## 2024-01-09 MED ORDER — ROSUVASTATIN CALCIUM 5 MG PO TABS: 5.0000 mg | ORAL_TABLET | Freq: Every day | ORAL | 3 refills | Status: AC

## 2024-01-09 NOTE — Patient Instructions (Signed)

## 2024-01-09 NOTE — Progress Notes (Signed)
 Subjective:    Patient ID: Bobby Ortiz, male    DOB: 1960-11-18, 63 y.o.   MRN: [id number]  Chief Complaint  Patient presents with   Medical Management of Chronic Issues   Pt presents to the office today for CPE and chronic follow up.   He has gout and takes allopurinol 100 mg and uses colchicine as needed. He can not remember his last flare up.    Diabetes He presents for his follow-up diabetic visit. He has type 2 diabetes mellitus. Pertinent negatives for diabetes include no blurred vision and no foot paresthesias. Risk factors for coronary artery disease include dyslipidemia, hypertension, sedentary lifestyle and diabetes mellitus. He is following a generally unhealthy diet. (Does not check glucose at home)  Hypertension This is a chronic problem. The current episode started more than 1 year ago. The problem has been resolved since onset. The problem is controlled. Pertinent negatives include no blurred vision, malaise/fatigue, peripheral edema or shortness of breath. Risk factors for coronary artery disease include dyslipidemia, diabetes mellitus, sedentary lifestyle, obesity and male gender. The current treatment provides moderate improvement.  Hyperlipidemia This is a chronic problem. The current episode started more than 1 year ago. The problem is controlled. Recent lipid tests were reviewed and are normal. Exacerbating diseases include obesity. Pertinent negatives include no shortness of breath. Current antihyperlipidemic treatment includes statins. The current treatment provides moderate improvement of lipids. Risk factors for coronary artery disease include dyslipidemia, diabetes mellitus, hypertension and a sedentary lifestyle.      Review of Systems  Constitutional:  Negative for malaise/fatigue.  Eyes:  Negative for blurred vision.  Respiratory:  Negative for shortness of breath.   All other systems reviewed and are negative.  Family History  Problem Relation Age of  Onset   Heart disease Mother        Stents   Cancer Mother        Stomach   Heart disease Father        Heart attack   Social History   Socioeconomic History   Marital status: Married    Spouse name: Not on file   Number of children: Not on file   Years of education: Not on file   Highest education level: Not on file  Occupational History   Not on file  Tobacco Use   Smoking status: Never   Smokeless tobacco: Never  Vaping Use   Vaping status: Never Used  Substance and Sexual Activity   Alcohol use: No   Drug use: No   Sexual activity: Yes  Other Topics Concern   Not on file  Social History Narrative   Not on file   Social Drivers of Health   Financial Resource Strain: Not on file  Food Insecurity: Not on file  Transportation Needs: Not on file  Physical Activity: Not on file  Stress: Not on file  Social Connections: Not on file       Objective:   Physical Exam Vitals reviewed.  Constitutional:      General: He is not in acute distress.    Appearance: He is well-developed. He is obese.  HENT:     Head: Normocephalic.     Right Ear: Tympanic membrane normal.     Left Ear: Tympanic membrane normal.  Eyes:     General:        Right eye: No discharge.        Left eye: No discharge.     Pupils: Pupils are equal,  round, and reactive to light.  Neck:     Thyroid: No thyromegaly.  Cardiovascular:     Rate and Rhythm: Normal rate and regular rhythm.     Heart sounds: Normal heart sounds. No murmur heard. Pulmonary:     Effort: Pulmonary effort is normal. No respiratory distress.     Breath sounds: Normal breath sounds. No wheezing.  Abdominal:     General: Bowel sounds are normal. There is no distension.     Palpations: Abdomen is soft.     Tenderness: There is no abdominal tenderness.  Musculoskeletal:        General: No tenderness. Normal range of motion.     Cervical back: Normal range of motion and neck supple.  Skin:    General: Skin is warm and  dry.     Findings: No erythema or rash.  Neurological:     Mental Status: He is alert and oriented to person, place, and time.     Cranial Nerves: No cranial nerve deficit.     Deep Tendon Reflexes: Reflexes are normal and symmetric.  Psychiatric:        Behavior: Behavior normal.        Thought Content: Thought content normal.        Judgment: Judgment normal.    Diabetic Foot Exam - Simple   Simple Foot Form Diabetic Foot exam was performed with the following findings: Yes 01/09/2024  3:04 PM  Visual Inspection No deformities, no ulcerations, no other skin breakdown bilaterally: Yes Sensation Testing Intact to touch and monofilament testing bilaterally: Yes Pulse Check Posterior Tibialis and Dorsalis pulse intact bilaterally: Yes Comments Great toenails thick and discolored        BP 118/83   Pulse 65   Temp 98 F (36.7 C) (Temporal)   Ht 6\' 3"  (1.905 m)   Wt (!) 334 lb 11.2 oz (151.8 kg)   SpO2 97%   BMI 41.83 kg/m      Assessment & Plan:  Bobby Ortiz comes in today with chief complaint of Medical Management of Chronic Issues   Diagnosis and orders addressed:  1. Chronic gout of multiple sites, unspecified cause - allopurinol (ZYLOPRIM) 100 MG tablet; TAKE ONE (1) TABLET EACH DAY  Dispense: 90 tablet; Refill: 3 - colchicine 0.6 MG tablet; Take 0.6mg  by mouth twice daily as needed for gout attack. (may take every two hours up to 6 doses at acute onset)  Dispense: 20 tablet; Refill: 2 - CMP14+EGFR - CBC with Differential/Platelet  2. Primary hypertension - amLODipine (NORVASC) 10 MG tablet; Take 1 tablet (10 mg total) by mouth daily.  Dispense: 90 tablet; Refill: 3 - lisinopril (ZESTRIL) 40 MG tablet; Take 1 tablet (40 mg total) by mouth daily.  Dispense: 90 tablet; Refill: 3 - CMP14+EGFR - CBC with Differential/Platelet  3. Hyperlipidemia, unspecified hyperlipidemia type - rosuvastatin (CRESTOR) 5 MG tablet; Take 1 tablet (5 mg total) by mouth daily.   Dispense: 90 tablet; Refill: 3 - CMP14+EGFR - CBC with Differential/Platelet - Lipid panel  4. Annual physical exam (Primary) - Microalbumin / creatinine urine ratio - Bayer DCA Hb A1c Waived - CMP14+EGFR - CBC with Differential/Platelet - Lipid panel - PSA, total and free - TSH - Vitamin B12  5. Morbid obesity (HCC) - CMP14+EGFR - CBC with Differential/Platelet  6. Type 2 diabetes mellitus with other specified complication, without long-term current use of insulin (HC - Microalbumin / creatinine urine ratio - Bayer DCA Hb A1c Waived - CMP14+EGFR - CBC  with Differential/Platelet - Lipid panel - TSH - Vitamin B12  Labs pending Continue current medications  Low carb diet  Health Maintenance reviewed Diet and exercise encouraged  Follow up plan: 3 months    Tommas Fragmin, FNP

## 2024-01-10 LAB — CBC WITH DIFFERENTIAL/PLATELET
Basophils Absolute: 0.1 10*3/uL (ref 0.0–0.2)
Basos: 2 %
EOS (ABSOLUTE): 0.1 10*3/uL (ref 0.0–0.4)
Eos: 1 %
Hematocrit: 43.2 % (ref 37.5–51.0)
Hemoglobin: 14.9 g/dL (ref 13.0–17.7)
Immature Grans (Abs): 0 10*3/uL (ref 0.0–0.1)
Immature Granulocytes: 0 %
Lymphocytes Absolute: 2.4 10*3/uL (ref 0.7–3.1)
Lymphs: 46 %
MCH: 30.8 pg (ref 26.6–33.0)
MCHC: 34.5 g/dL (ref 31.5–35.7)
MCV: 89 fL (ref 79–97)
Monocytes Absolute: 0.3 10*3/uL (ref 0.1–0.9)
Monocytes: 6 %
Neutrophils Absolute: 2.4 10*3/uL (ref 1.4–7.0)
Neutrophils: 45 %
Platelets: 246 10*3/uL (ref 150–450)
RBC: 4.84 x10E6/uL (ref 4.14–5.80)
RDW: 13 % (ref 11.6–15.4)
WBC: 5.4 10*3/uL (ref 3.4–10.8)

## 2024-01-10 LAB — CMP14+EGFR
ALT: 18 IU/L (ref 0–44)
AST: 19 IU/L (ref 0–40)
Albumin: 4.7 g/dL (ref 3.9–4.9)
Alkaline Phosphatase: 86 IU/L (ref 44–121)
BUN/Creatinine Ratio: 19 (ref 10–24)
BUN: 21 mg/dL (ref 8–27)
Bilirubin Total: 0.3 mg/dL (ref 0.0–1.2)
CO2: 21 mmol/L (ref 20–29)
Calcium: 9.9 mg/dL (ref 8.6–10.2)
Chloride: 100 mmol/L (ref 96–106)
Creatinine, Ser: 1.11 mg/dL (ref 0.76–1.27)
Globulin, Total: 2.5 g/dL (ref 1.5–4.5)
Glucose: 117 mg/dL — ABNORMAL HIGH (ref 70–99)
Potassium: 4.2 mmol/L (ref 3.5–5.2)
Sodium: 137 mmol/L (ref 134–144)
Total Protein: 7.2 g/dL (ref 6.0–8.5)
eGFR: 75 mL/min/{1.73_m2} (ref >59)

## 2024-01-10 LAB — LIPID PANEL
Cholesterol, Total: 102 mg/dL (ref 100–199)
HDL: 35 mg/dL — ABNORMAL LOW (ref >39)
LDL CALC COMMENT:: 2.9 ratio (ref 0.0–5.0)
LDL Chol Calc (NIH): 46 mg/dL (ref 0–99)
Triglycerides: 117 mg/dL (ref 0–149)
VLDL Cholesterol Cal: 21 mg/dL (ref 5–40)

## 2024-01-10 LAB — VITAMIN B12: Vitamin B-12: 373 pg/mL (ref 232–1245)

## 2024-01-10 LAB — MICROALBUMIN / CREATININE URINE RATIO
Creatinine, Urine: 221.8 mg/dL
Microalb/Creat Ratio: 10 mg/g{creat} (ref 0–29)
Microalbumin, Urine: 21.1 ug/mL

## 2024-01-10 LAB — PSA, TOTAL AND FREE
PSA, Free Pct: 22.2 %
PSA, Free: 0.4 ng/mL
Prostate Specific Ag, Serum: 1.8 ng/mL (ref 0.0–4.0)

## 2024-01-10 LAB — TSH: TSH: 2.68 u[IU]/mL (ref 0.450–4.500)

## 2024-01-11 IMAGING — DX DG CHEST 1V PORT
1 series · 1 of 1 positions shown · non-contrast
Comparison: CT abdomen and pelvis 10/13/2021.

CLINICAL DATA: Questionable sepsis.

EXAM:
PORTABLE CHEST 1 VIEW

[chest]
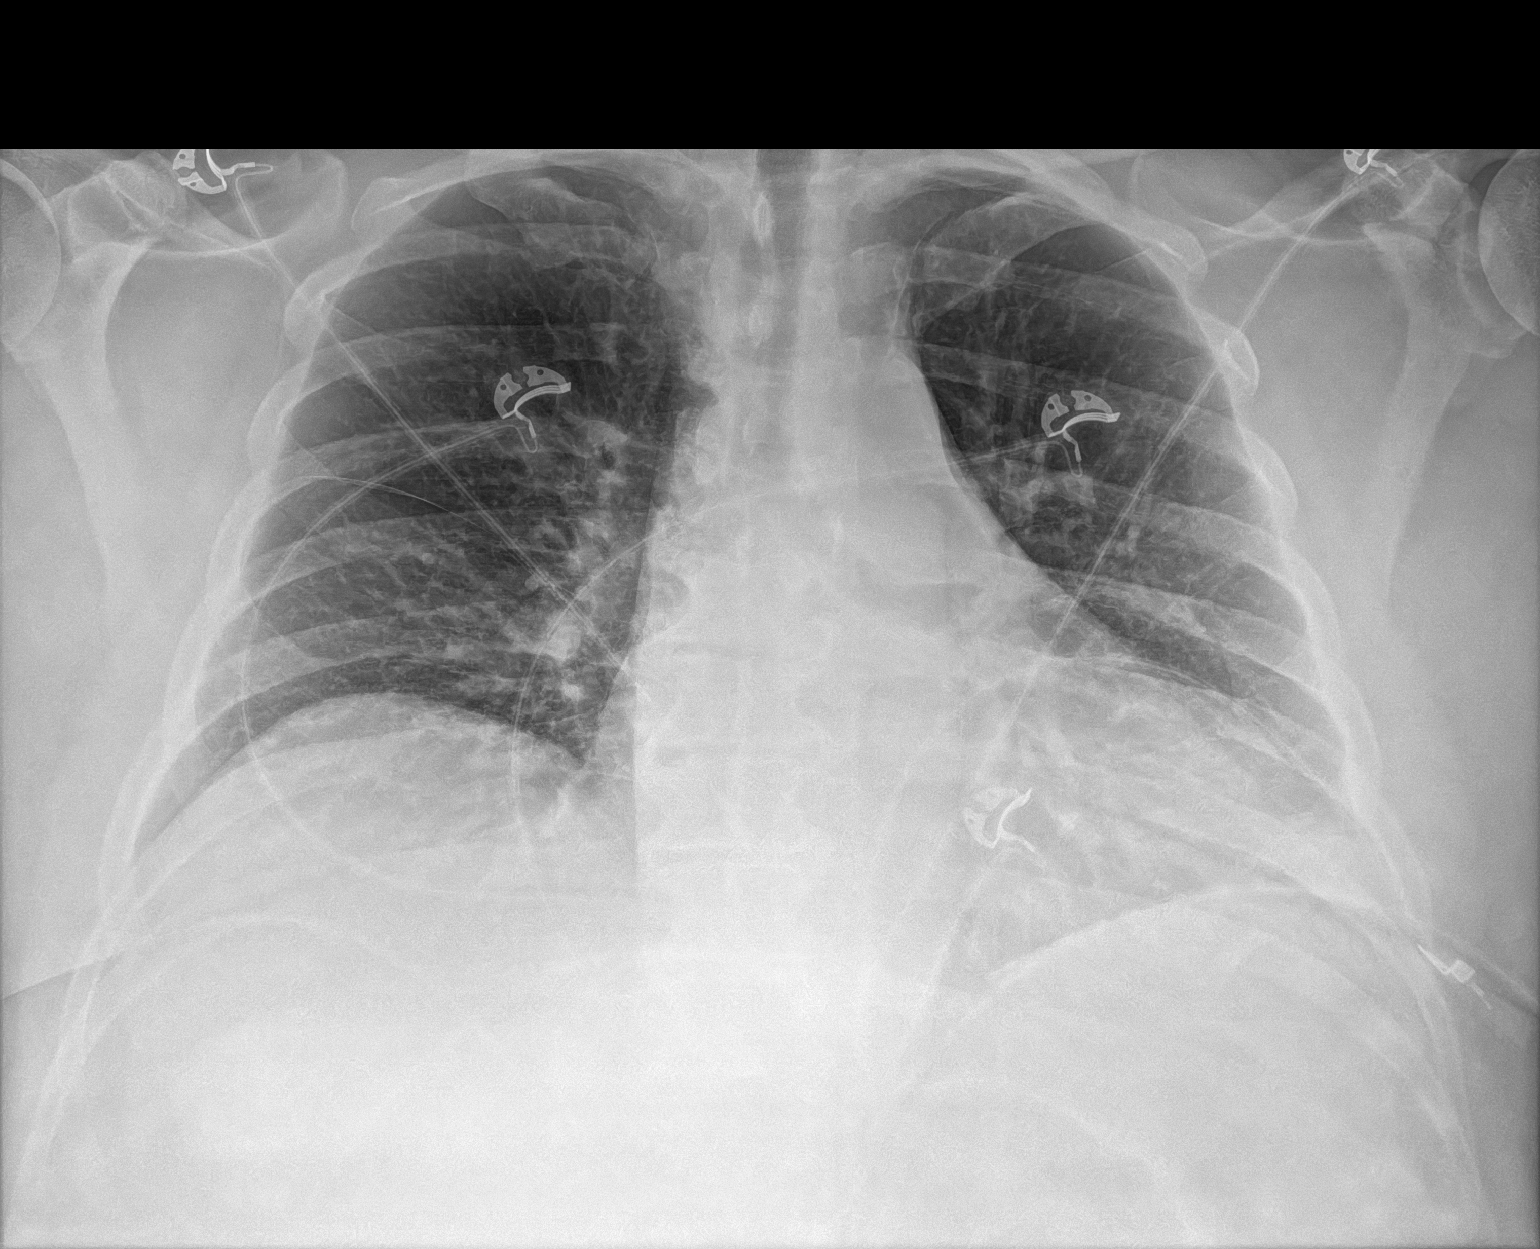

[1 of 1 positions shown; findings below may reference images not displayed]

FINDINGS: The heart size and mediastinal contours are within normal limits.
Both lungs are clear. The visualized skeletal structures are
unremarkable.
IMPRESSION: No active disease.

## 2024-01-11 IMAGING — CT CT ABD-PELV W/O CM
2 of 4 series · 16 of 46 positions shown, 18 images · non-contrast
Comparison: None.

CLINICAL DATA: Bowel obstruction suspected



[Series 2: abd pel wo · axial · 0.92mm/px · z∈[-148,+347]mm · 13 of 109 slices shown, 15 images]
[im 5/109  soft-tissue]
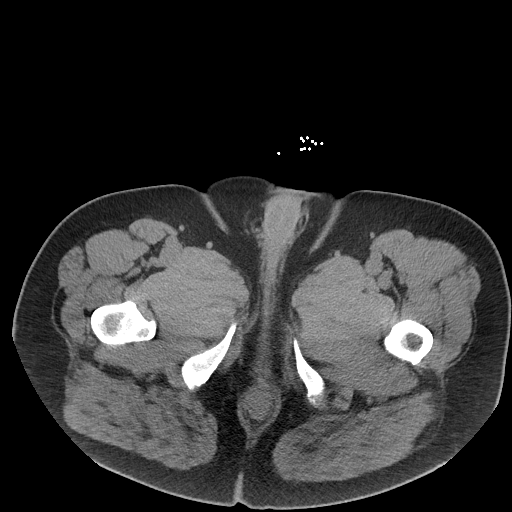
[im 5/109  bone]
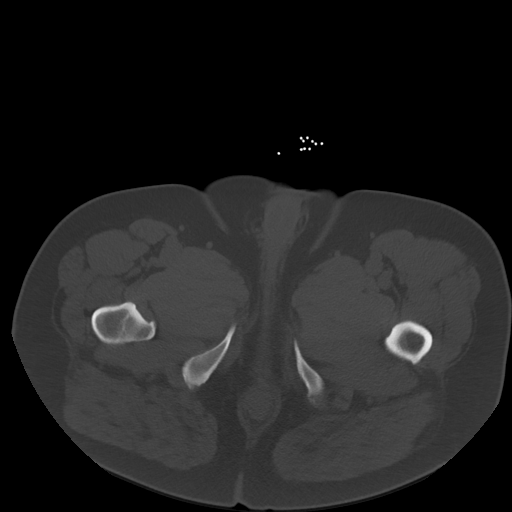
[im 14/109  soft-tissue]
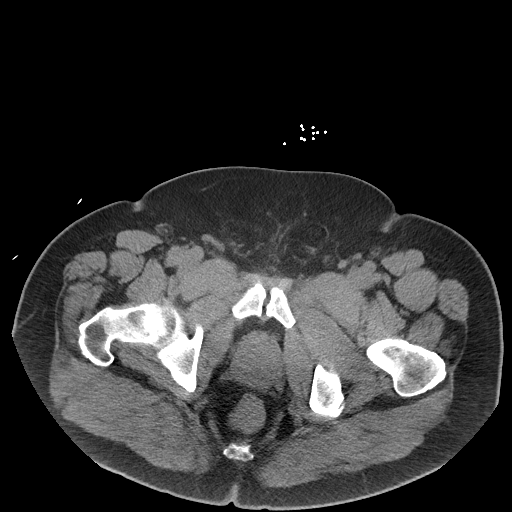
[im 23/109  soft-tissue]
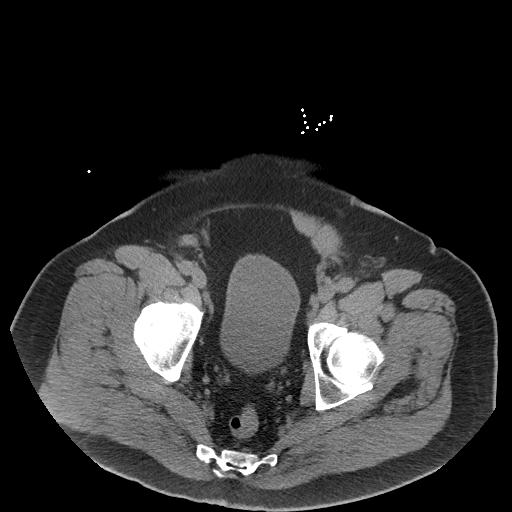
[im 32/109  soft-tissue]
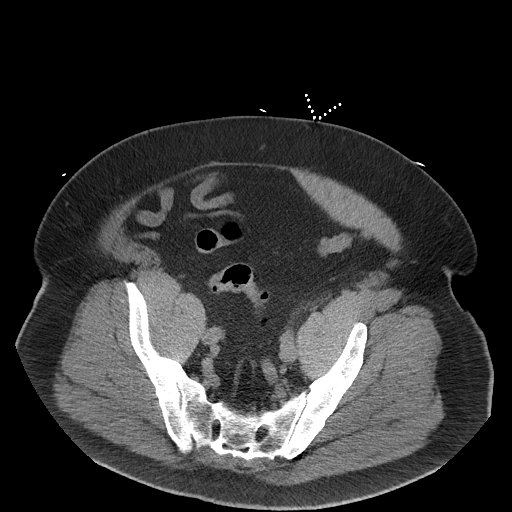
[im 37/109  soft-tissue]
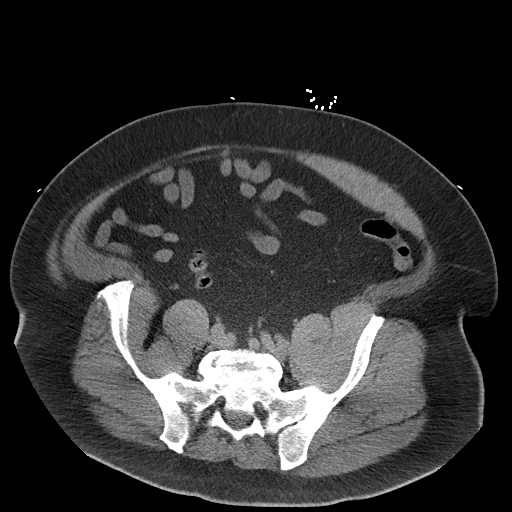
[im 46/109  soft-tissue]
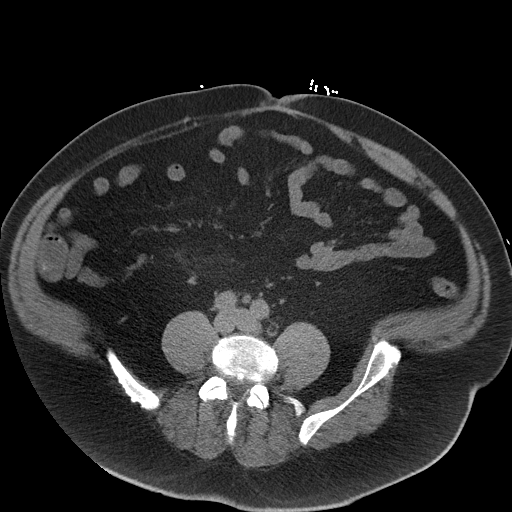
[im 55/109  soft-tissue]
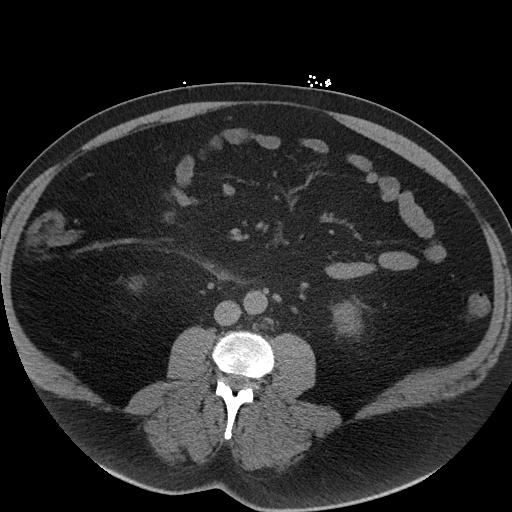
[im 64/109  soft-tissue]
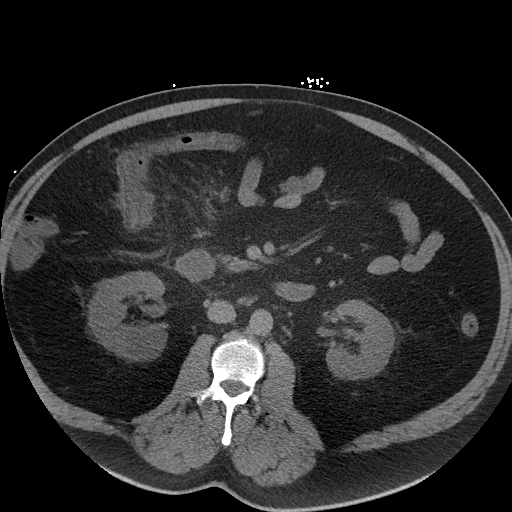
[im 73/109  soft-tissue]
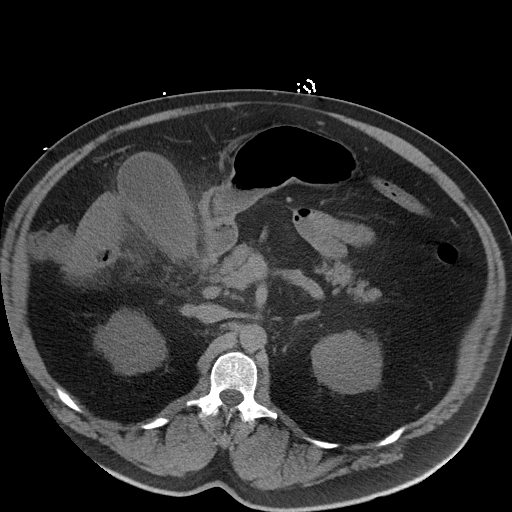
[im 73/109  bone]
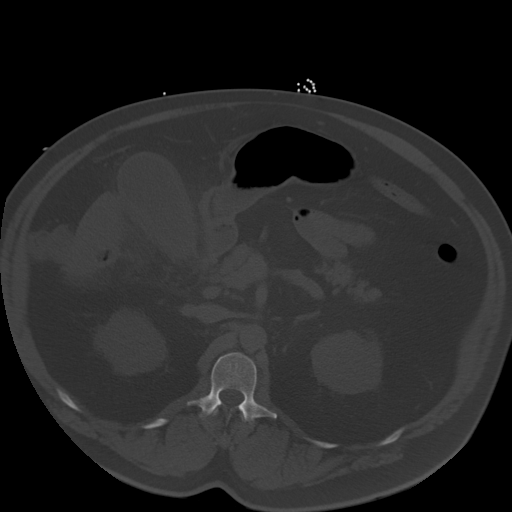
[im 77/109  soft-tissue]
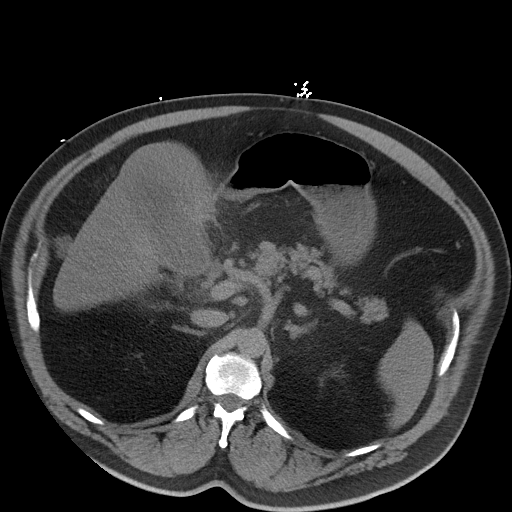
[im 86/109  soft-tissue]
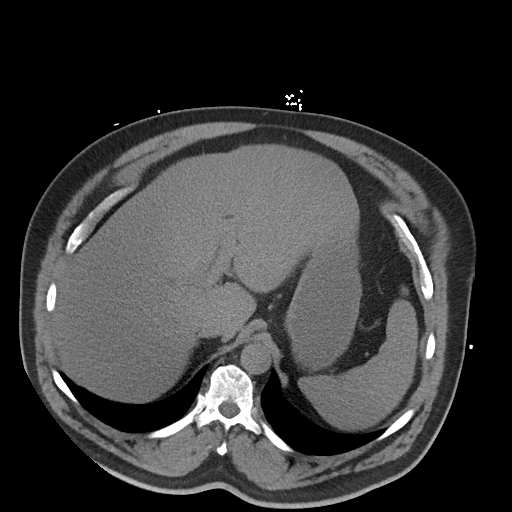
[im 95/109  soft-tissue]
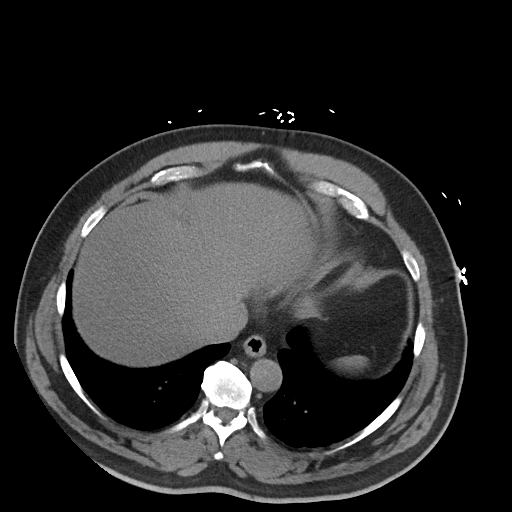
[im 104/109  soft-tissue]
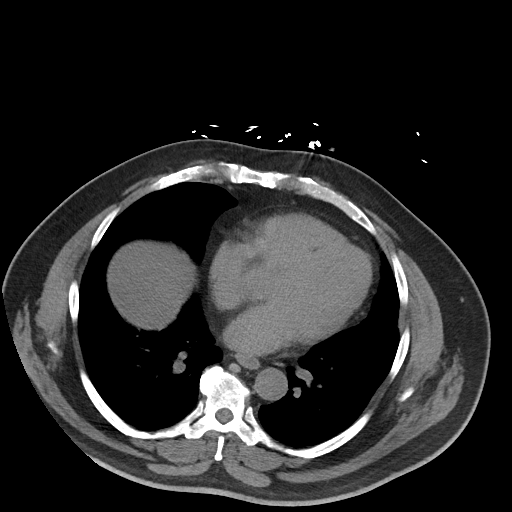

[Series 5: coronal · coronal · 0.98mm/px · 3 of 142 slices shown]
[im 48/142  soft-tissue]
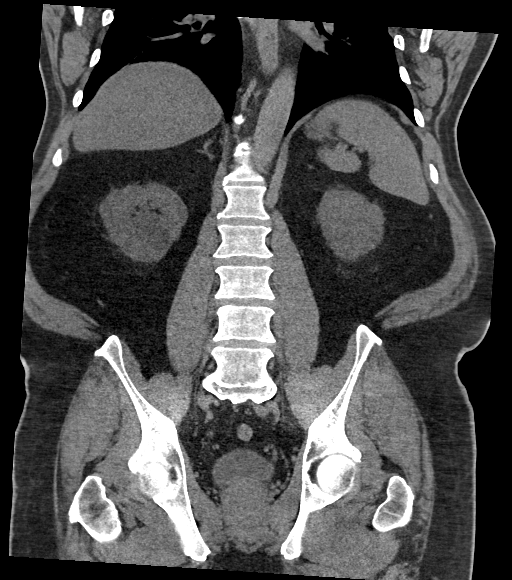
[im 63/142  soft-tissue]
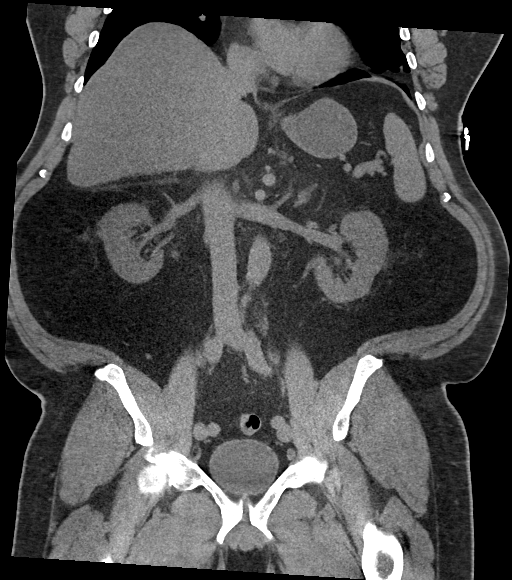
[im 79/142  soft-tissue]
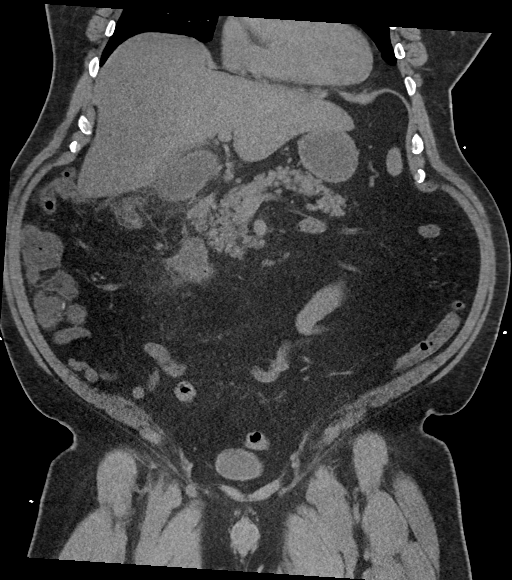

[16 of 46 positions shown; findings below may reference images not displayed]

FINDINGS: Lower chest: No acute abnormality.

Hepatobiliary: No focal liver abnormality. Gallbladder wall
thickening and pericholecystic fluid. Layering hyperdensity within
gallbladder lumen may represent tiny gallstones versus gallbladder
sludge. No biliary dilatation.

Pancreas: No focal lesion. Normal pancreatic contour. No surrounding
inflammatory changes. No main pancreatic ductal dilatation.

Spleen: Normal in size without focal abnormality.

Adrenals/Urinary Tract:

No adrenal nodule bilaterally.

No nephrolithiasis and no hydronephrosis. Parapelvic 4.1 x 3.5 cm
fluid density lesion likely representing a parapelvic cyst.

No ureterolithiasis or hydroureter.

The urinary bladder is unremarkable.

Stomach/Bowel: Stomach is within normal limits. Vague reactive
changes of the duodenum. Otherwise no evidence of bowel wall
thickening or dilatation. Status post appendectomy.

Vascular/Lymphatic: No abdominal aorta or iliac aneurysm. Mild
atherosclerotic plaque of the aorta and its branches. No abdominal,
pelvic, or inguinal lymphadenopathy.

Reproductive: Prostate is unremarkable.

Other: No intraperitoneal free fluid. No intraperitoneal free gas.
No organized fluid collection.

Musculoskeletal:

No abdominal wall hernia or abnormality.

No suspicious lytic or blastic osseous lesions. No acute displaced
fracture. Multilevel degenerative changes of the spine.
IMPRESSION: Cholelithiasis with acute cholecystitis.

## 2024-01-12 IMAGING — US US ABDOMEN LIMITED
1 series · 15 of 25 positions shown · non-contrast
Comparison: Noncontrast CT Abdomen and Pelvis yesterday.

CLINICAL DATA: 60-year-old male with nausea, vomiting.

EXAM:
ULTRASOUND ABDOMEN LIMITED RIGHT UPPER QUADRANT

[Series 1: us abdomen limited ruq mc & wl · 15 of 78 slices shown]
[im 1/78]
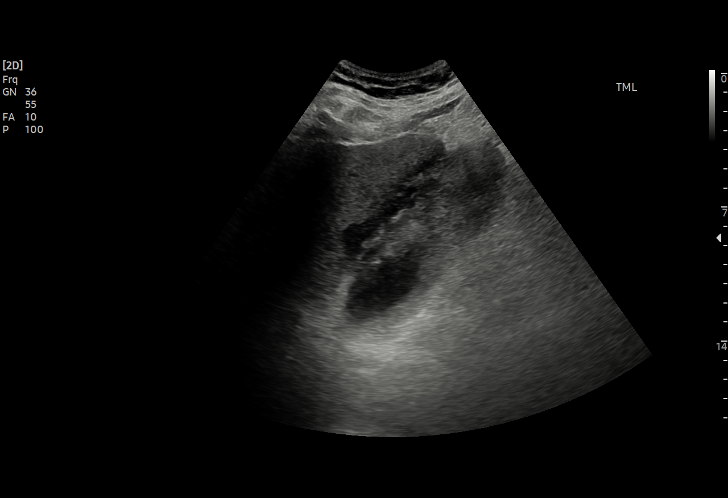
[im 7/78]
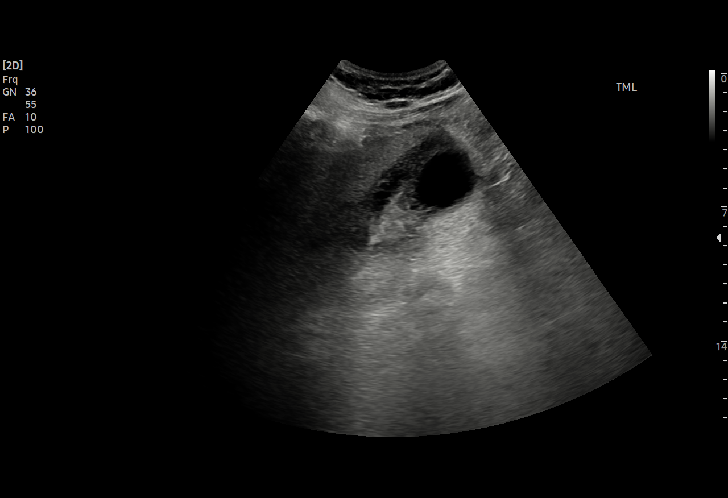
[im 13/78]
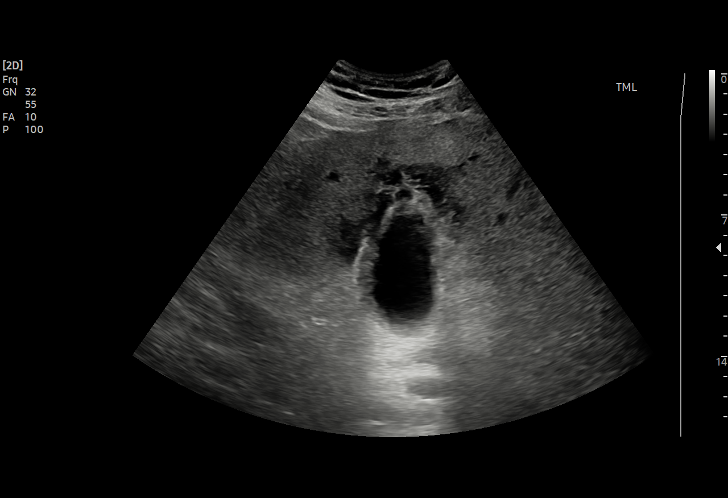
[im 17/78]
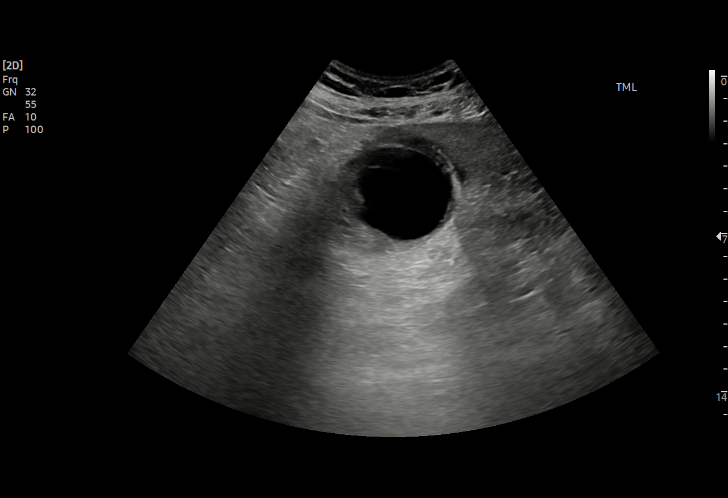
[im 23/78]
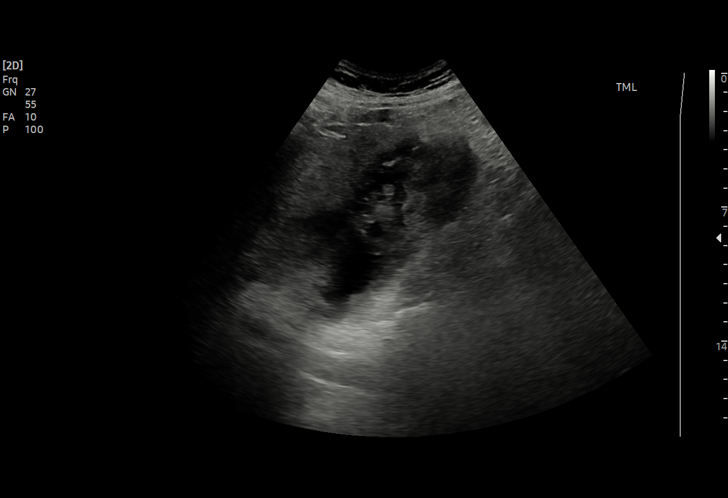
[im 29/78]
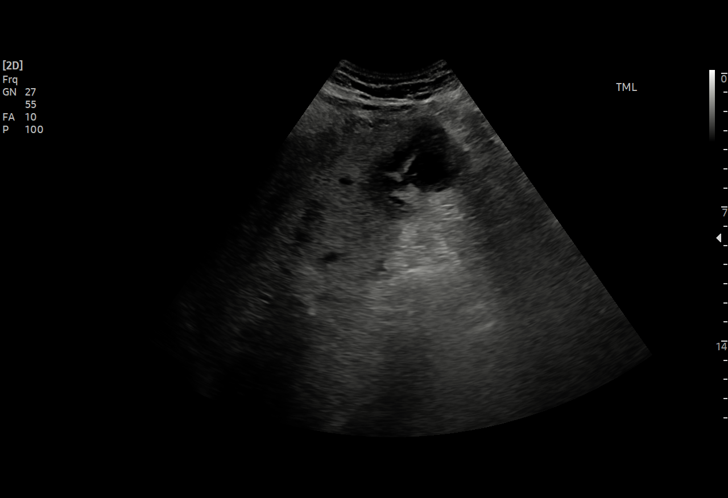
[im 33/78]
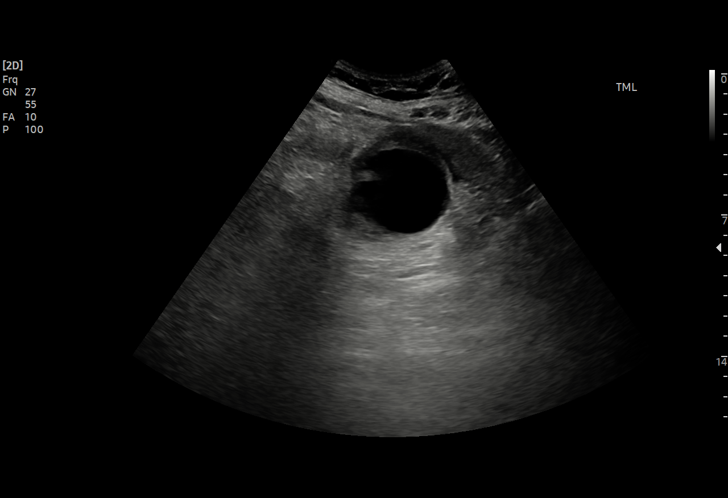
[im 39/78]
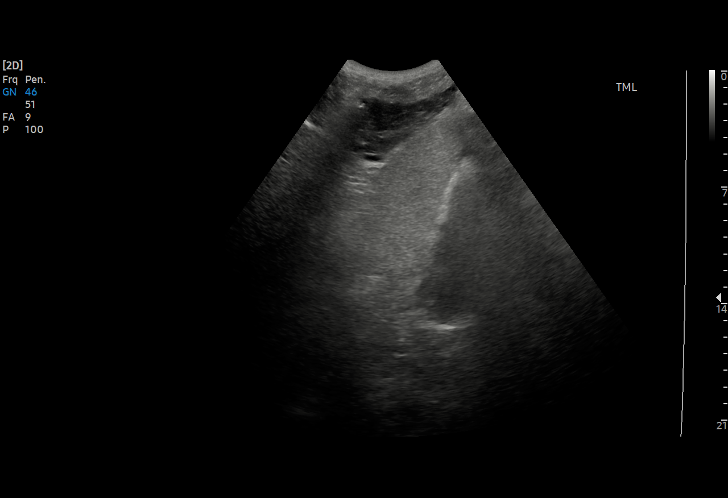
[im 45/78]
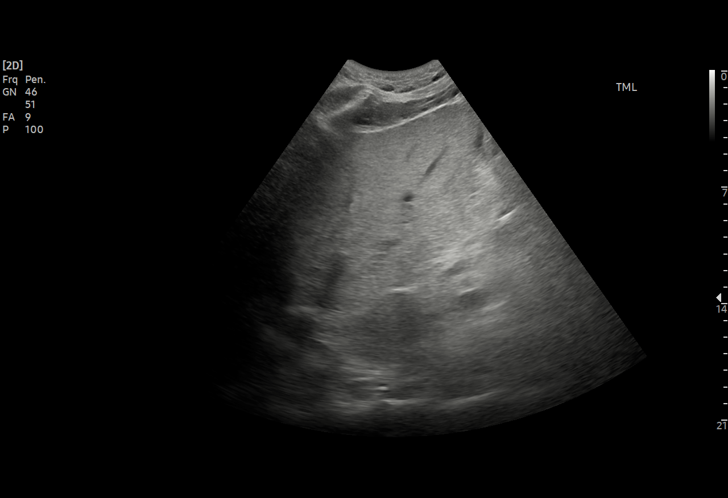
[im 49/78]
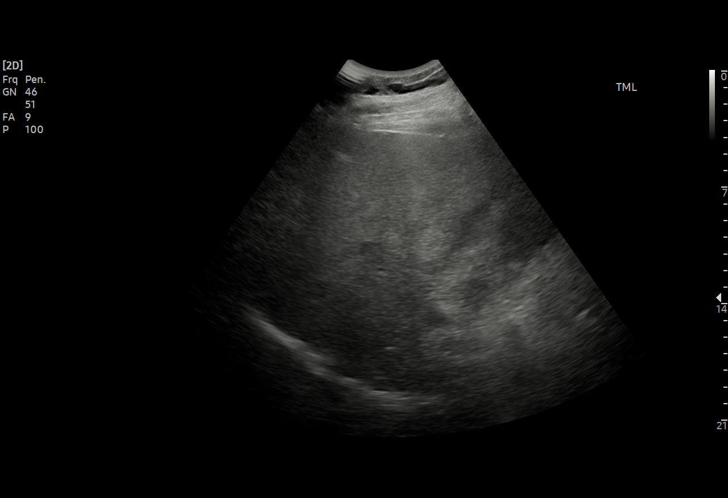
[im 55/78]
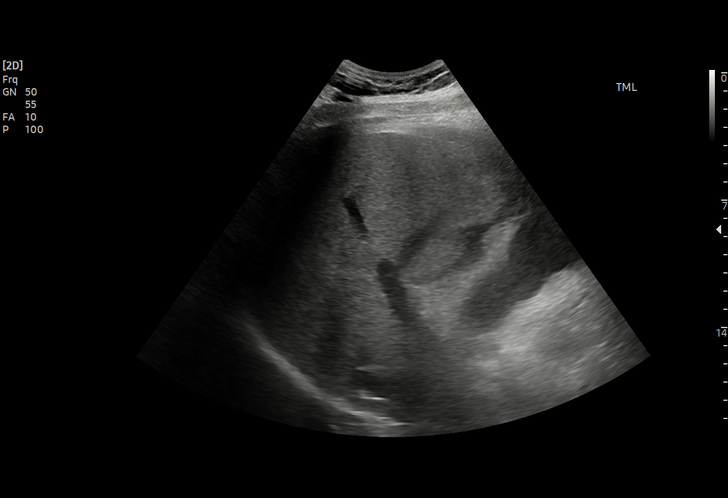
[im 61/78]
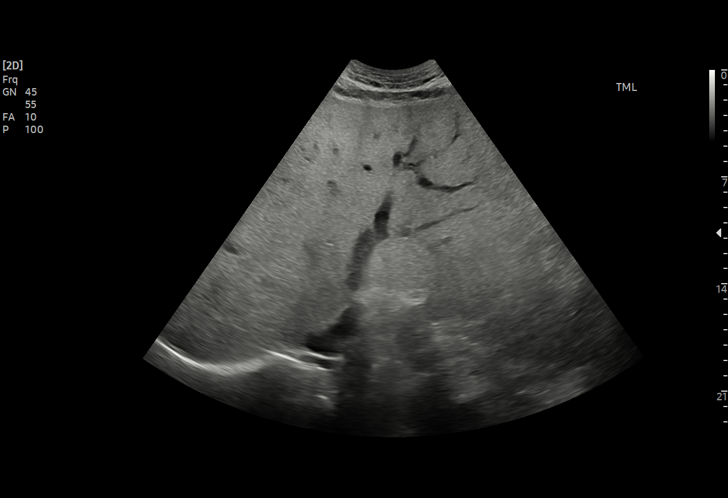
[im 65/78]
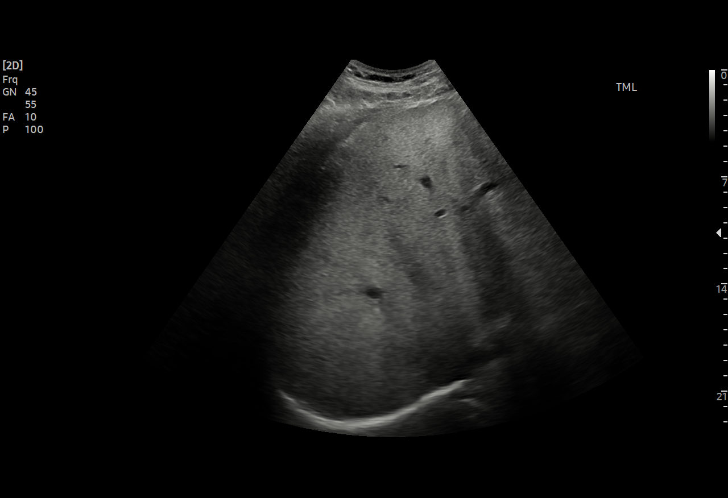
[im 71/78]
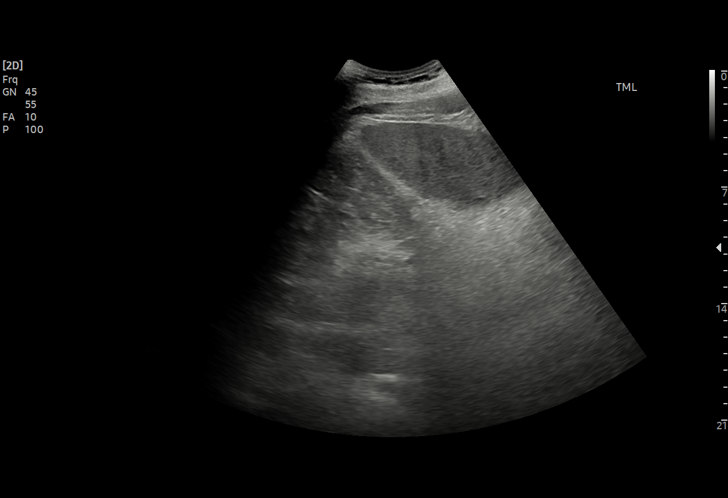
[im 78/78]
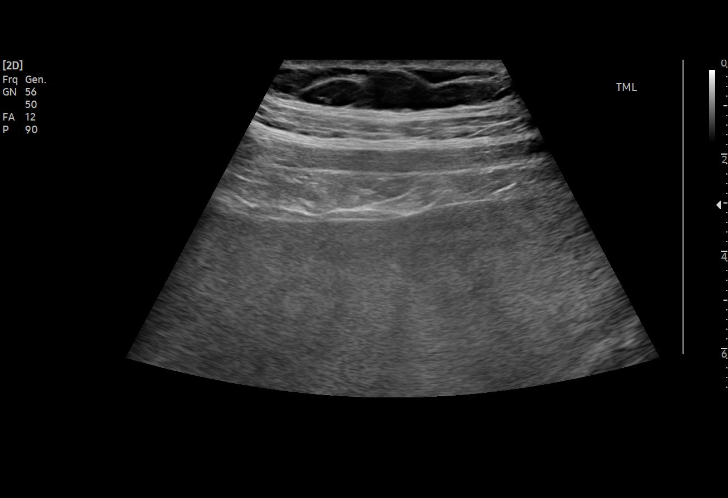

[15 of 25 positions shown; findings below may reference images not displayed]

FINDINGS: Gallbladder:

Diffuse heterogeneous gallbladder wall thickening and/or edema
(images 4, 15). Wall thickness 10 mm or greater. No shadowing
echogenic stones. Mild if any layering sludge. Sonographic Murphy
sign not specified.

Common bile duct:

Diameter: 5-7 mm, not definitely dilated.

Liver:

Prominent hepatic veins suspected on image 63. No definite
intrahepatic biliary ductal dilatation. Mildly echogenic liver. No
discrete liver lesion. Portal vein is patent on color Doppler
imaging with normal direction of blood flow towards the liver.

Other: Negative visible right kidney.
IMPRESSION: 1. Severe gallbladder wall edema, but no cholelithiasis identified
by ultrasound. Favor Acalculous Acute Cholecystitis.
2. No definite bile duct obstruction.
3. Probable hepatic steatosis.

## 2024-05-10 ENCOUNTER — Encounter: Payer: Self-pay | Admitting: Family

## 2024-05-10 ENCOUNTER — Ambulatory Visit: Admitting: Family

## 2024-05-10 VITALS — BP 139/86 | HR 74 | Temp 98.9°F | Ht 75.0 in | Wt 338.4 lb

## 2024-05-10 DIAGNOSIS — I10 Essential (primary) hypertension: Secondary | ICD-10-CM

## 2024-05-10 DIAGNOSIS — E1169 Type 2 diabetes mellitus with other specified complication: Secondary | ICD-10-CM | POA: Diagnosis not present

## 2024-05-10 DIAGNOSIS — M109 Gout, unspecified: Secondary | ICD-10-CM

## 2024-05-10 DIAGNOSIS — E785 Hyperlipidemia, unspecified: Secondary | ICD-10-CM | POA: Diagnosis not present

## 2024-05-10 LAB — BAYER DCA HB A1C WAIVED: HB A1C (BAYER DCA - WAIVED): 6.4 % — ABNORMAL HIGH (ref 4.8–5.6)

## 2024-05-10 NOTE — Patient Instructions (Signed)
 Health Maintenance, Male  Adopting a healthy lifestyle and getting preventive care are important in promoting health and wellness. Ask your health care provider about:  The right schedule for you to have regular tests and exams.  Things you can do on your own to prevent diseases and keep yourself healthy.  What should I know about diet, weight, and exercise?  Eat a healthy diet    Eat a diet that includes plenty of vegetables, fruits, low-fat dairy products, and lean protein.  Do not eat a lot of foods that are high in solid fats, added sugars, or sodium.  Maintain a healthy weight  Body mass index (BMI) is a measurement that can be used to identify possible weight problems. It estimates body fat based on height and weight. Your health care provider can help determine your BMI and help you achieve or maintain a healthy weight.  Get regular exercise  Get regular exercise. This is one of the most important things you can do for your health. Most adults should:  Exercise for at least 150 minutes each week. The exercise should increase your heart rate and make you sweat (moderate-intensity exercise).  Do strengthening exercises at least twice a week. This is in addition to the moderate-intensity exercise.  Spend less time sitting. Even light physical activity can be beneficial.  Watch cholesterol and blood lipids  Have your blood tested for lipids and cholesterol at 63 years of age, then have this test every 5 years.  You may need to have your cholesterol levels checked more often if:  Your lipid or cholesterol levels are high.  You are older than 63 years of age.  You are at high risk for heart disease.  What should I know about cancer screening?  Many types of cancers can be detected early and may often be prevented. Depending on your health history and family history, you may need to have cancer screening at various ages. This may include screening for:  Colorectal cancer.  Prostate cancer.  Skin cancer.  Lung  cancer.  What should I know about heart disease, diabetes, and high blood pressure?  Blood pressure and heart disease  High blood pressure causes heart disease and increases the risk of stroke. This is more likely to develop in people who have high blood pressure readings or are overweight.  Talk with your health care provider about your target blood pressure readings.  Have your blood pressure checked:  Every 3-5 years if you are 63-63 years of age.  Every year if you are 63 years old or older.  If you are between the ages of 60 and 72 and are a current or former smoker, ask your health care provider if you should have a one-time screening for abdominal aortic aneurysm (AAA).  Diabetes  Have regular diabetes screenings. This checks your fasting blood sugar level. Have the screening done:  Once every three years after age 63 if you are at a normal weight and have a low risk for diabetes.  More often and at a younger age if you are overweight or have a high risk for diabetes.  What should I know about preventing infection?  Hepatitis B  If you have a higher risk for hepatitis B, you should be screened for this virus. Talk with your health care provider to find out if you are at risk for hepatitis B infection.  Hepatitis C  Blood testing is recommended for:  Everyone born from [birth year] through 1965.  Anyone  with known risk factors for hepatitis C.  Sexually transmitted infections (STIs)  You should be screened each year for STIs, including gonorrhea and chlamydia, if:  You are sexually active and are 63 years of age.  You are older than 63 years of age and your health care provider tells you that you are at risk for this type of infection.  Your sexual activity has changed since you were last screened, and you are at increased risk for chlamydia or gonorrhea. Ask your health care provider if you are at risk.  Ask your health care provider about whether you are at high risk for HIV. Your health care provider  may recommend a prescription medicine to help prevent HIV infection. If you choose to take medicine to prevent HIV, you should first get tested for HIV. You should then be tested every 3 months for as long as you are taking the medicine.  Follow these instructions at home:  Alcohol use  Do not drink alcohol if your health care provider tells you not to drink.  If you drink alcohol:  Limit how much you have to 0-2 drinks a day.  Know how much alcohol is in your drink. In the [place] one drink equals one 12 oz bottle of beer (355 mL), one 5 oz glass of wine (148 mL), or one 1 oz glass of hard liquor (44 mL).  Lifestyle  Do not use any products that contain nicotine or tobacco. These products include cigarettes, chewing tobacco, and vaping devices, such as e-cigarettes. If you need help quitting, ask your health care provider.  Do not use street drugs.  Do not share needles.  Ask your health care provider for help if you need support or information about quitting drugs.  General instructions  Schedule regular health, dental, and eye exams.  Stay current with your vaccines.  Tell your health care provider if:  You often feel depressed.  You have ever been abused or do not feel safe at home.  Summary  Adopting a healthy lifestyle and getting preventive care are important in promoting health and wellness.  Follow your health care provider's instructions about healthy diet, exercising, and getting tested or screened for diseases.  Follow your health care provider's instructions on monitoring your cholesterol and blood pressure.  This information is not intended to replace advice given to you by your health care provider. Make sure you discuss any questions you have with your health care provider.  Document Revised: 02/01/2021 Document Reviewed: 02/01/2021  Elsevier Patient Education  2024 [employer].

## 2024-05-10 NOTE — Progress Notes (Signed)
 Subjective:    Patient ID: Bobby Ortiz, male    DOB: 09-Oct-1960, 63 y.o.   MRN: [id number]  Chief Complaint  Patient presents with   Medical Management of Chronic Issues   Pt presents to the office today for chronic follow up.   He has gout and takes allopurinol 100 mg and uses colchicine as needed. He can not remember his last flare up.    Has cataracts and scheduled for surgery in the next few months.  Diabetes He presents for his follow-up diabetic visit. He has type 2 diabetes mellitus. Pertinent negatives for diabetes include no blurred vision and no foot paresthesias. Risk factors for coronary artery disease include dyslipidemia, hypertension, sedentary lifestyle and diabetes mellitus. He is following a generally unhealthy diet. (Does not check glucose at home) Eye exam is current.  Hypertension This is a chronic problem. The current episode started more than 1 year ago. The problem has been resolved since onset. The problem is controlled. Pertinent negatives include no blurred vision, malaise/fatigue, peripheral edema or shortness of breath. Risk factors for coronary artery disease include dyslipidemia, diabetes mellitus, sedentary lifestyle, obesity and male gender. The current treatment provides moderate improvement.  Hyperlipidemia This is a chronic problem. The current episode started more than 1 year ago. The problem is controlled. Recent lipid tests were reviewed and are normal. Exacerbating diseases include obesity. Pertinent negatives include no shortness of breath. Current antihyperlipidemic treatment includes statins. The current treatment provides moderate improvement of lipids. Risk factors for coronary artery disease include dyslipidemia, diabetes mellitus, hypertension, a sedentary lifestyle, obesity and male sex.      Review of Systems  Constitutional:  Negative for malaise/fatigue.  Eyes:  Negative for blurred vision.  Respiratory:  Negative for shortness of  breath.   All other systems reviewed and are negative.  Family History  Problem Relation Age of Onset   Heart disease Mother        Stents   Cancer Mother        Stomach   Heart disease Father        Heart attack   Social History   Socioeconomic History   Marital status: Married    Spouse name: Not on file   Number of children: Not on file   Years of education: Not on file   Highest education level: Not on file  Occupational History   Not on file  Tobacco Use   Smoking status: Never   Smokeless tobacco: Never  Vaping Use   Vaping status: Never Used  Substance and Sexual Activity   Alcohol use: No   Drug use: No   Sexual activity: Yes  Other Topics Concern   Not on file  Social History Narrative   Not on file   Social Drivers of Health   Financial Resource Strain: Not on file  Food Insecurity: Not on file  Transportation Needs: Not on file  Physical Activity: Not on file  Stress: Not on file  Social Connections: Not on file       Objective:   Physical Exam Vitals reviewed.  Constitutional:      General: He is not in acute distress.    Appearance: He is well-developed. He is obese.  HENT:     Head: Normocephalic.     Right Ear: Tympanic membrane normal.     Left Ear: Tympanic membrane normal.  Eyes:     General:        Right eye: No discharge.  Left eye: No discharge.     Pupils: Pupils are equal, round, and reactive to light.  Neck:     Thyroid: No thyromegaly.  Cardiovascular:     Rate and Rhythm: Normal rate and regular rhythm.     Heart sounds: Normal heart sounds. No murmur heard. Pulmonary:     Effort: Pulmonary effort is normal. No respiratory distress.     Breath sounds: No wheezing.  Abdominal:     General: Bowel sounds are normal. There is no distension.     Palpations: Abdomen is soft.     Tenderness: There is no abdominal tenderness.  Musculoskeletal:        General: No tenderness. Normal range of motion.     Cervical back:  Normal range of motion and neck supple.     Right lower leg: Edema (trace) present.     Left lower leg: Edema (trace) present.  Skin:    General: Skin is warm and dry.     Findings: No erythema or rash.  Neurological:     Mental Status: He is alert and oriented to person, place, and time.     Cranial Nerves: No cranial nerve deficit.     Deep Tendon Reflexes: Reflexes are normal and symmetric.  Psychiatric:        Behavior: Behavior normal.        Thought Content: Thought content normal.        Judgment: Judgment normal.     BP 139/86   Pulse 74   Temp 98.9 F (37.2 C) (Temporal)   Ht 6' 3 (1.905 m)   Wt (!) 338 lb 6.4 oz (153.5 kg)   BMI 42.30 kg/m      Assessment & Plan:  Bobby Ortiz comes in today with chief complaint of Medical Management of Chronic Issues   Diagnosis and orders addressed:  1. Primary hypertension (Primary) - CMP14+EGFR - CBC with Differential/Platelet  2. Type 2 diabetes mellitus with other specified complication, without long-term current use of insulin (HCC) - Bayer DCA Hb A1c Waived - CMP14+EGFR - CBC with Differential/Platelet  3. Morbid obesity (HCC) - CMP14+EGFR - CBC with Differential/Platelet  4. Hyperlipidemia, unspecified hyperlipidemia type - CMP14+EGFR - CBC with Differential/Platelet  5. Acute gout of multiple sites, unspecified cause - CMP14+EGFR - CBC with Differential/Platelet   Labs pending Continue current medications  Low carb diet  Health Maintenance reviewed Diet and exercise encouraged  Follow up plan: 6 months    Bari Learn, FNP

## 2024-05-11 LAB — CBC WITH DIFFERENTIAL/PLATELET
Basophils Absolute: 0.1 x10E3/uL (ref 0.0–0.2)
Basos: 1 %
EOS (ABSOLUTE): 0.1 x10E3/uL (ref 0.0–0.4)
Eos: 1 %
Hematocrit: 41 % (ref 37.5–51.0)
Hemoglobin: 13.7 g/dL (ref 13.0–17.7)
Immature Grans (Abs): 0 x10E3/uL (ref 0.0–0.1)
Immature Granulocytes: 0 %
Lymphocytes Absolute: 2.1 x10E3/uL (ref 0.7–3.1)
Lymphs: 32 %
MCH: 30.4 pg (ref 26.6–33.0)
MCHC: 33.4 g/dL (ref 31.5–35.7)
MCV: 91 fL (ref 79–97)
Monocytes Absolute: 0.3 x10E3/uL (ref 0.1–0.9)
Monocytes: 5 %
Neutrophils Absolute: 3.8 x10E3/uL (ref 1.4–7.0)
Neutrophils: 61 %
Platelets: 252 x10E3/uL (ref 150–450)
RBC: 4.5 x10E6/uL (ref 4.14–5.80)
RDW: 13 % (ref 11.6–15.4)
WBC: 6.4 x10E3/uL (ref 3.4–10.8)

## 2024-05-11 LAB — CMP14+EGFR
ALT: 17 IU/L (ref 0–44)
AST: 20 IU/L (ref 0–40)
Albumin: 4.5 g/dL (ref 3.9–4.9)
Alkaline Phosphatase: 89 IU/L (ref 44–121)
BUN/Creatinine Ratio: 15 (ref 10–24)
BUN: 18 mg/dL (ref 8–27)
Bilirubin Total: 0.2 mg/dL (ref 0.0–1.2)
CO2: 21 mmol/L (ref 20–29)
Calcium: 9.6 mg/dL (ref 8.6–10.2)
Chloride: 102 mmol/L (ref 96–106)
Creatinine, Ser: 1.17 mg/dL (ref 0.76–1.27)
Globulin, Total: 2.9 g/dL (ref 1.5–4.5)
Glucose: 136 mg/dL — ABNORMAL HIGH (ref 70–99)
Potassium: 4.2 mmol/L (ref 3.5–5.2)
Sodium: 138 mmol/L (ref 134–144)
Total Protein: 7.4 g/dL (ref 6.0–8.5)
eGFR: 70 mL/min/1.73 (ref >59)

## 2024-05-13 ENCOUNTER — Ambulatory Visit: Payer: Self-pay | Admitting: Family
